# Patient Record
Sex: Female | Born: 1940 | Race: White | Hispanic: No | Marital: Married | Smoking: Former smoker
Health system: Southern US, Community
[De-identification: ages and names within clinical notes are randomized; demographics above are authoritative.]

## PROBLEM LIST (undated history)

## (undated) DIAGNOSIS — E669 Obesity, unspecified: Secondary | ICD-10-CM

## (undated) DIAGNOSIS — K644 Residual hemorrhoidal skin tags: Secondary | ICD-10-CM

## (undated) DIAGNOSIS — C801 Malignant (primary) neoplasm, unspecified: Secondary | ICD-10-CM

## (undated) DIAGNOSIS — K5909 Other constipation: Secondary | ICD-10-CM

## (undated) DIAGNOSIS — I509 Heart failure, unspecified: Secondary | ICD-10-CM

## (undated) DIAGNOSIS — IMO0002 Reserved for concepts with insufficient information to code with codable children: Secondary | ICD-10-CM

## (undated) DIAGNOSIS — M545 Low back pain, unspecified: Secondary | ICD-10-CM

## (undated) DIAGNOSIS — D369 Benign neoplasm, unspecified site: Secondary | ICD-10-CM

## (undated) DIAGNOSIS — R0989 Other specified symptoms and signs involving the circulatory and respiratory systems: Secondary | ICD-10-CM

## (undated) DIAGNOSIS — E8881 Metabolic syndrome: Secondary | ICD-10-CM

## (undated) DIAGNOSIS — M1990 Unspecified osteoarthritis, unspecified site: Secondary | ICD-10-CM

## (undated) DIAGNOSIS — R609 Edema, unspecified: Secondary | ICD-10-CM

## (undated) DIAGNOSIS — E785 Hyperlipidemia, unspecified: Secondary | ICD-10-CM

## (undated) DIAGNOSIS — D649 Anemia, unspecified: Secondary | ICD-10-CM

## (undated) DIAGNOSIS — I350 Nonrheumatic aortic (valve) stenosis: Secondary | ICD-10-CM

## (undated) DIAGNOSIS — I10 Essential (primary) hypertension: Secondary | ICD-10-CM

## (undated) DIAGNOSIS — I499 Cardiac arrhythmia, unspecified: Secondary | ICD-10-CM

## (undated) DIAGNOSIS — IMO0001 Reserved for inherently not codable concepts without codable children: Secondary | ICD-10-CM

## (undated) DIAGNOSIS — K219 Gastro-esophageal reflux disease without esophagitis: Secondary | ICD-10-CM

## (undated) HISTORY — DX: Benign neoplasm, unspecified site: D36.9

## (undated) HISTORY — DX: Other specified symptoms and signs involving the circulatory and respiratory systems: R09.89

## (undated) HISTORY — DX: Obesity, unspecified: E66.9

## (undated) HISTORY — DX: Malignant (primary) neoplasm, unspecified: C80.1

## (undated) HISTORY — DX: Metabolic syndrome: E88.810

## (undated) HISTORY — DX: Other constipation: K59.09

## (undated) HISTORY — DX: Metabolic syndrome: E88.81

## (undated) HISTORY — DX: Reserved for concepts with insufficient information to code with codable children: IMO0002

## (undated) HISTORY — PX: AORTIC VALVE REPLACEMENT: SHX41

## (undated) HISTORY — DX: Nonrheumatic aortic (valve) stenosis: I35.0

## (undated) HISTORY — PX: POLYPECTOMY: SHX149

## (undated) HISTORY — PX: BREAST LUMPECTOMY: SHX2

## (undated) HISTORY — PX: COLONOSCOPY: SHX174

## (undated) HISTORY — DX: Hyperlipidemia, unspecified: E78.5

## (undated) HISTORY — PX: REPLACEMENT TOTAL KNEE: SUR1224

## (undated) HISTORY — DX: Edema, unspecified: R60.9

## (undated) HISTORY — DX: Reserved for inherently not codable concepts without codable children: IMO0001

## (undated) HISTORY — DX: Low back pain, unspecified: M54.50

## (undated) HISTORY — DX: Low back pain: M54.5

## (undated) HISTORY — DX: Essential (primary) hypertension: I10

## (undated) HISTORY — PX: OTHER SURGICAL HISTORY: SHX169

## (undated) HISTORY — PX: TUBAL LIGATION: SHX77

## (undated) HISTORY — DX: Anemia, unspecified: D64.9

## (undated) HISTORY — DX: Unspecified osteoarthritis, unspecified site: M19.90

## (undated) HISTORY — DX: Residual hemorrhoidal skin tags: K64.4

## (undated) HISTORY — DX: Heart failure, unspecified: I50.9

## (undated) HISTORY — PX: ABDOMINAL HYSTERECTOMY: SHX81

## (undated) HISTORY — DX: Gastro-esophageal reflux disease without esophagitis: K21.9

---

## 1997-07-23 ENCOUNTER — Encounter: Payer: Self-pay | Admitting: Gastroenterology

## 1998-04-14 ENCOUNTER — Ambulatory Visit (HOSPITAL_COMMUNITY): Admission: RE | Admit: 1998-04-14 | Discharge: 1998-04-14 | Payer: Self-pay | Admitting: Obstetrics and Gynecology

## 1999-09-04 ENCOUNTER — Encounter (INDEPENDENT_AMBULATORY_CARE_PROVIDER_SITE_OTHER): Payer: Self-pay | Admitting: Specialist

## 1999-09-04 ENCOUNTER — Encounter: Payer: Self-pay | Admitting: Gastroenterology

## 1999-09-04 ENCOUNTER — Other Ambulatory Visit: Admission: RE | Admit: 1999-09-04 | Discharge: 1999-09-04 | Payer: Self-pay | Admitting: Gastroenterology

## 2000-07-25 ENCOUNTER — Other Ambulatory Visit: Admission: RE | Admit: 2000-07-25 | Discharge: 2000-07-25 | Payer: Self-pay | Admitting: Obstetrics and Gynecology

## 2000-08-05 ENCOUNTER — Encounter: Admission: RE | Admit: 2000-08-05 | Discharge: 2000-08-05 | Payer: Self-pay | Admitting: Obstetrics and Gynecology

## 2000-08-05 ENCOUNTER — Encounter: Payer: Self-pay | Admitting: Obstetrics and Gynecology

## 2000-09-18 ENCOUNTER — Encounter: Admission: RE | Admit: 2000-09-18 | Discharge: 2000-12-17 | Payer: Self-pay | Admitting: Internal Medicine

## 2001-09-16 ENCOUNTER — Ambulatory Visit (HOSPITAL_COMMUNITY): Admission: RE | Admit: 2001-09-16 | Discharge: 2001-09-16 | Payer: Self-pay | Admitting: Cardiology

## 2001-10-01 ENCOUNTER — Other Ambulatory Visit: Admission: RE | Admit: 2001-10-01 | Discharge: 2001-10-01 | Payer: Self-pay | Admitting: Obstetrics and Gynecology

## 2001-10-16 ENCOUNTER — Encounter: Admission: RE | Admit: 2001-10-16 | Discharge: 2001-10-16 | Payer: Self-pay | Admitting: Obstetrics and Gynecology

## 2001-10-16 ENCOUNTER — Encounter: Payer: Self-pay | Admitting: Obstetrics and Gynecology

## 2002-03-25 ENCOUNTER — Encounter: Payer: Self-pay | Admitting: Obstetrics and Gynecology

## 2002-03-27 ENCOUNTER — Encounter (INDEPENDENT_AMBULATORY_CARE_PROVIDER_SITE_OTHER): Payer: Self-pay

## 2002-03-27 ENCOUNTER — Ambulatory Visit (HOSPITAL_COMMUNITY): Admission: RE | Admit: 2002-03-27 | Discharge: 2002-03-27 | Payer: Self-pay | Admitting: Obstetrics and Gynecology

## 2002-05-13 ENCOUNTER — Encounter (INDEPENDENT_AMBULATORY_CARE_PROVIDER_SITE_OTHER): Payer: Self-pay | Admitting: Specialist

## 2002-05-13 ENCOUNTER — Inpatient Hospital Stay (HOSPITAL_COMMUNITY): Admission: RE | Admit: 2002-05-13 | Discharge: 2002-05-15 | Payer: Self-pay | Admitting: Obstetrics and Gynecology

## 2002-12-09 ENCOUNTER — Encounter: Admission: RE | Admit: 2002-12-09 | Discharge: 2002-12-09 | Payer: Self-pay | Admitting: Internal Medicine

## 2002-12-09 ENCOUNTER — Encounter: Payer: Self-pay | Admitting: Internal Medicine

## 2003-05-19 ENCOUNTER — Encounter: Payer: Self-pay | Admitting: Gastroenterology

## 2003-06-30 ENCOUNTER — Encounter: Payer: Self-pay | Admitting: Gastroenterology

## 2004-12-13 ENCOUNTER — Ambulatory Visit: Payer: Self-pay | Admitting: Internal Medicine

## 2005-01-17 ENCOUNTER — Ambulatory Visit: Payer: Self-pay | Admitting: Internal Medicine

## 2005-01-18 ENCOUNTER — Ambulatory Visit: Payer: Self-pay | Admitting: Internal Medicine

## 2005-02-15 ENCOUNTER — Encounter: Admission: RE | Admit: 2005-02-15 | Discharge: 2005-02-15 | Payer: Self-pay | Admitting: Internal Medicine

## 2005-03-06 ENCOUNTER — Encounter: Admission: RE | Admit: 2005-03-06 | Discharge: 2005-03-06 | Payer: Self-pay | Admitting: Internal Medicine

## 2005-05-23 ENCOUNTER — Ambulatory Visit: Payer: Self-pay | Admitting: Internal Medicine

## 2005-06-14 ENCOUNTER — Encounter: Admission: RE | Admit: 2005-06-14 | Discharge: 2005-09-12 | Payer: Self-pay | Admitting: Internal Medicine

## 2005-08-29 ENCOUNTER — Ambulatory Visit: Payer: Self-pay | Admitting: Internal Medicine

## 2005-10-23 ENCOUNTER — Encounter: Admission: RE | Admit: 2005-10-23 | Discharge: 2005-10-23 | Payer: Self-pay | Admitting: Obstetrics and Gynecology

## 2005-12-05 ENCOUNTER — Ambulatory Visit: Payer: Self-pay | Admitting: Internal Medicine

## 2005-12-14 ENCOUNTER — Ambulatory Visit: Payer: Self-pay | Admitting: Internal Medicine

## 2005-12-24 ENCOUNTER — Encounter: Admission: RE | Admit: 2005-12-24 | Discharge: 2005-12-24 | Payer: Self-pay | Admitting: Internal Medicine

## 2006-05-07 ENCOUNTER — Encounter: Admission: RE | Admit: 2006-05-07 | Discharge: 2006-05-07 | Payer: Self-pay | Admitting: Internal Medicine

## 2006-08-16 ENCOUNTER — Ambulatory Visit: Payer: Self-pay | Admitting: Internal Medicine

## 2006-08-21 ENCOUNTER — Ambulatory Visit: Payer: Self-pay | Admitting: Internal Medicine

## 2006-08-21 LAB — CONVERTED CEMR LAB
ALT: 18 units/L (ref 0–40)
Chol/HDL Ratio, serum: 4.2
Cholesterol: 206 mg/dL (ref 0–200)
Creatinine, Ser: 0.6 mg/dL (ref 0.4–1.2)
HCT: 35.4 % — ABNORMAL LOW (ref 36.0–46.0)
Hemoglobin: 11.7 g/dL — ABNORMAL LOW (ref 12.0–15.0)
Hgb A1c MFr Bld: 5.9 % (ref 4.6–6.0)
Lymphocytes Relative: 35.6 % (ref 12.0–46.0)
MCV: 90.9 fL (ref 78.0–100.0)
Microalb, Ur: 1.9 mg/dL — ABNORMAL HIGH (ref 0.0–1.9)
Monocytes Absolute: 0.6 10*3/uL (ref 0.2–0.7)
Neutro Abs: 4.4 10*3/uL (ref 1.4–7.7)
Neutrophils Relative %: 56 % (ref 43.0–77.0)
Saturation Ratios: 14.3 % — ABNORMAL LOW (ref 20.0–50.0)
Triglyceride fasting, serum: 73 mg/dL (ref 0–149)
Vitamin B-12: 243 pg/mL (ref 211–911)
WBC: 7.9 10*3/uL (ref 4.5–10.5)

## 2006-08-27 ENCOUNTER — Inpatient Hospital Stay (HOSPITAL_COMMUNITY): Admission: RE | Admit: 2006-08-27 | Discharge: 2006-08-31 | Payer: Self-pay | Admitting: Orthopedic Surgery

## 2006-08-30 ENCOUNTER — Ambulatory Visit: Payer: Self-pay | Admitting: Internal Medicine

## 2007-01-02 ENCOUNTER — Ambulatory Visit: Payer: Self-pay | Admitting: Internal Medicine

## 2007-01-02 LAB — CONVERTED CEMR LAB
ALT: 18 units/L (ref 0–40)
BUN: 27 mg/dL — ABNORMAL HIGH (ref 6–23)
Basophils Absolute: 0.1 10*3/uL (ref 0.0–0.1)
Basophils Relative: 1.3 % — ABNORMAL HIGH (ref 0.0–1.0)
Creatinine, Ser: 0.7 mg/dL (ref 0.4–1.2)
Eosinophils Relative: 0.1 % (ref 0.0–5.0)
HCT: 36 % (ref 36.0–46.0)
HDL: 56.7 mg/dL (ref >39.0)
Hgb A1c MFr Bld: 6.4 % — ABNORMAL HIGH (ref 4.6–6.0)
Lymphocytes Relative: 19.2 % (ref 12.0–46.0)
Monocytes Relative: 1.8 % — ABNORMAL LOW (ref 3.0–11.0)
Neutro Abs: 8.2 10*3/uL — ABNORMAL HIGH (ref 1.4–7.7)
Neutrophils Relative %: 77.6 % — ABNORMAL HIGH (ref 43.0–77.0)
Potassium: 5.1 meq/L (ref 3.5–5.1)
Total CHOL/HDL Ratio: 3.4
VLDL: 21 mg/dL (ref 0–40)

## 2007-04-07 DIAGNOSIS — D649 Anemia, unspecified: Secondary | ICD-10-CM

## 2007-04-07 DIAGNOSIS — R0989 Other specified symptoms and signs involving the circulatory and respiratory systems: Secondary | ICD-10-CM

## 2007-04-10 ENCOUNTER — Ambulatory Visit: Payer: Self-pay | Admitting: Internal Medicine

## 2007-04-28 ENCOUNTER — Ambulatory Visit: Payer: Self-pay | Admitting: Family Medicine

## 2007-04-28 DIAGNOSIS — E785 Hyperlipidemia, unspecified: Secondary | ICD-10-CM | POA: Insufficient documentation

## 2007-04-28 DIAGNOSIS — E1129 Type 2 diabetes mellitus with other diabetic kidney complication: Secondary | ICD-10-CM

## 2007-05-01 ENCOUNTER — Encounter: Payer: Self-pay | Admitting: Internal Medicine

## 2007-05-01 ENCOUNTER — Inpatient Hospital Stay (HOSPITAL_COMMUNITY): Admission: RE | Admit: 2007-05-01 | Discharge: 2007-05-04 | Payer: Self-pay | Admitting: Orthopedic Surgery

## 2007-05-12 ENCOUNTER — Telehealth (INDEPENDENT_AMBULATORY_CARE_PROVIDER_SITE_OTHER): Payer: Self-pay | Admitting: *Deleted

## 2007-05-16 ENCOUNTER — Telehealth (INDEPENDENT_AMBULATORY_CARE_PROVIDER_SITE_OTHER): Payer: Self-pay | Admitting: *Deleted

## 2007-05-16 ENCOUNTER — Ambulatory Visit: Payer: Self-pay | Admitting: Internal Medicine

## 2007-05-16 LAB — CONVERTED CEMR LAB
Glucose, Urine, Semiquant: NEGATIVE
Nitrite: NEGATIVE
Protein, U semiquant: 30
Specific Gravity, Urine: <1.005

## 2007-05-17 ENCOUNTER — Encounter: Payer: Self-pay | Admitting: Internal Medicine

## 2007-06-23 ENCOUNTER — Telehealth (INDEPENDENT_AMBULATORY_CARE_PROVIDER_SITE_OTHER): Payer: Self-pay | Admitting: *Deleted

## 2007-07-21 ENCOUNTER — Encounter: Payer: Self-pay | Admitting: Internal Medicine

## 2007-09-02 ENCOUNTER — Telehealth (INDEPENDENT_AMBULATORY_CARE_PROVIDER_SITE_OTHER): Payer: Self-pay | Admitting: *Deleted

## 2007-10-01 ENCOUNTER — Inpatient Hospital Stay (HOSPITAL_COMMUNITY): Admission: EM | Admit: 2007-10-01 | Discharge: 2007-10-05 | Payer: Self-pay | Admitting: Emergency Medicine

## 2007-10-01 ENCOUNTER — Encounter: Payer: Self-pay | Admitting: Cardiovascular Disease

## 2007-10-01 ENCOUNTER — Ambulatory Visit: Payer: Self-pay | Admitting: Cardiovascular Disease

## 2007-10-04 ENCOUNTER — Encounter: Payer: Self-pay | Admitting: Cardiovascular Disease

## 2007-10-06 ENCOUNTER — Ambulatory Visit: Payer: Self-pay | Admitting: Internal Medicine

## 2007-10-06 DIAGNOSIS — M545 Low back pain: Secondary | ICD-10-CM

## 2007-10-06 LAB — CONVERTED CEMR LAB
Glucose, Urine, Semiquant: NEGATIVE
Ketones, urine, test strip: NEGATIVE
Specific Gravity, Urine: 1.01
pH: 5

## 2007-10-08 ENCOUNTER — Encounter: Payer: Self-pay | Admitting: Internal Medicine

## 2007-10-13 ENCOUNTER — Ambulatory Visit: Payer: Self-pay | Admitting: Cardiovascular Disease

## 2007-12-15 ENCOUNTER — Telehealth (INDEPENDENT_AMBULATORY_CARE_PROVIDER_SITE_OTHER): Payer: Self-pay | Admitting: *Deleted

## 2007-12-16 ENCOUNTER — Telehealth (INDEPENDENT_AMBULATORY_CARE_PROVIDER_SITE_OTHER): Payer: Self-pay | Admitting: *Deleted

## 2007-12-18 ENCOUNTER — Ambulatory Visit: Payer: Self-pay | Admitting: Internal Medicine

## 2007-12-18 DIAGNOSIS — I10 Essential (primary) hypertension: Secondary | ICD-10-CM | POA: Insufficient documentation

## 2007-12-19 ENCOUNTER — Telehealth (INDEPENDENT_AMBULATORY_CARE_PROVIDER_SITE_OTHER): Payer: Self-pay | Admitting: *Deleted

## 2008-01-20 ENCOUNTER — Telehealth (INDEPENDENT_AMBULATORY_CARE_PROVIDER_SITE_OTHER): Payer: Self-pay | Admitting: *Deleted

## 2008-01-22 ENCOUNTER — Telehealth (INDEPENDENT_AMBULATORY_CARE_PROVIDER_SITE_OTHER): Payer: Self-pay | Admitting: *Deleted

## 2008-01-23 ENCOUNTER — Encounter: Payer: Self-pay | Admitting: Internal Medicine

## 2008-01-23 ENCOUNTER — Ambulatory Visit: Payer: Self-pay | Admitting: Family Medicine

## 2008-01-29 ENCOUNTER — Telehealth (INDEPENDENT_AMBULATORY_CARE_PROVIDER_SITE_OTHER): Payer: Self-pay | Admitting: *Deleted

## 2008-01-30 ENCOUNTER — Ambulatory Visit: Payer: Self-pay | Admitting: Internal Medicine

## 2008-02-16 ENCOUNTER — Ambulatory Visit: Payer: Self-pay | Admitting: Internal Medicine

## 2008-02-19 ENCOUNTER — Encounter (INDEPENDENT_AMBULATORY_CARE_PROVIDER_SITE_OTHER): Payer: Self-pay | Admitting: *Deleted

## 2008-02-19 LAB — CONVERTED CEMR LAB
ALT: 18 units/L (ref 0–35)
AST: 18 units/L (ref 0–37)
Albumin: 3.6 g/dL (ref 3.5–5.2)
Alkaline Phosphatase: 71 units/L (ref 39–117)
LDL Cholesterol: 123 mg/dL — ABNORMAL HIGH (ref 0–99)
Microalb Creat Ratio: 24.2 mg/g (ref 0.0–30.0)
Total Protein: 7.4 g/dL (ref 6.0–8.3)

## 2008-02-26 ENCOUNTER — Ambulatory Visit: Payer: Self-pay | Admitting: Cardiovascular Disease

## 2008-03-11 ENCOUNTER — Ambulatory Visit: Payer: Self-pay | Admitting: Internal Medicine

## 2008-05-10 ENCOUNTER — Ambulatory Visit: Payer: Self-pay | Admitting: Gastroenterology

## 2008-06-07 ENCOUNTER — Telehealth: Payer: Self-pay | Admitting: Gastroenterology

## 2008-06-08 ENCOUNTER — Ambulatory Visit: Payer: Self-pay | Admitting: Gastroenterology

## 2008-06-08 ENCOUNTER — Encounter: Payer: Self-pay | Admitting: Gastroenterology

## 2008-06-10 ENCOUNTER — Encounter: Payer: Self-pay | Admitting: Gastroenterology

## 2008-06-21 ENCOUNTER — Telehealth (INDEPENDENT_AMBULATORY_CARE_PROVIDER_SITE_OTHER): Payer: Self-pay | Admitting: *Deleted

## 2008-08-16 ENCOUNTER — Encounter: Admission: RE | Admit: 2008-08-16 | Discharge: 2008-08-16 | Payer: Self-pay | Admitting: Obstetrics and Gynecology

## 2008-08-16 ENCOUNTER — Encounter (INDEPENDENT_AMBULATORY_CARE_PROVIDER_SITE_OTHER): Payer: Self-pay | Admitting: Diagnostic Radiology

## 2008-08-24 ENCOUNTER — Encounter: Admission: RE | Admit: 2008-08-24 | Discharge: 2008-08-24 | Payer: Self-pay | Admitting: Obstetrics and Gynecology

## 2008-08-24 ENCOUNTER — Ambulatory Visit: Admission: RE | Admit: 2008-08-24 | Discharge: 2008-11-14 | Payer: Self-pay | Admitting: Radiation Oncology

## 2008-08-25 ENCOUNTER — Encounter: Payer: Self-pay | Admitting: Internal Medicine

## 2008-08-30 ENCOUNTER — Encounter: Admission: RE | Admit: 2008-08-30 | Discharge: 2008-08-30 | Payer: Self-pay | Admitting: General Surgery

## 2008-08-31 ENCOUNTER — Ambulatory Visit (HOSPITAL_BASED_OUTPATIENT_CLINIC_OR_DEPARTMENT_OTHER): Admission: RE | Admit: 2008-08-31 | Discharge: 2008-08-31 | Payer: Self-pay | Admitting: General Surgery

## 2008-08-31 ENCOUNTER — Encounter (INDEPENDENT_AMBULATORY_CARE_PROVIDER_SITE_OTHER): Payer: Self-pay | Admitting: General Surgery

## 2008-08-31 ENCOUNTER — Encounter: Admission: RE | Admit: 2008-08-31 | Discharge: 2008-08-31 | Payer: Self-pay | Admitting: General Surgery

## 2008-09-02 ENCOUNTER — Encounter (INDEPENDENT_AMBULATORY_CARE_PROVIDER_SITE_OTHER): Payer: Self-pay | Admitting: General Surgery

## 2008-09-02 ENCOUNTER — Ambulatory Visit (HOSPITAL_COMMUNITY): Admission: RE | Admit: 2008-09-02 | Discharge: 2008-09-02 | Payer: Self-pay | Admitting: General Surgery

## 2008-09-08 ENCOUNTER — Encounter: Payer: Self-pay | Admitting: Cardiovascular Disease

## 2008-09-08 ENCOUNTER — Ambulatory Visit: Payer: Self-pay

## 2008-09-08 ENCOUNTER — Ambulatory Visit: Payer: Self-pay | Admitting: Cardiovascular Disease

## 2008-09-09 ENCOUNTER — Encounter: Payer: Self-pay | Admitting: Internal Medicine

## 2008-11-09 ENCOUNTER — Encounter: Payer: Self-pay | Admitting: Internal Medicine

## 2008-11-12 ENCOUNTER — Ambulatory Visit: Payer: Self-pay | Admitting: Internal Medicine

## 2008-11-12 ENCOUNTER — Encounter (INDEPENDENT_AMBULATORY_CARE_PROVIDER_SITE_OTHER): Payer: Self-pay | Admitting: *Deleted

## 2008-11-12 DIAGNOSIS — Z853 Personal history of malignant neoplasm of breast: Secondary | ICD-10-CM

## 2008-11-12 LAB — CONVERTED CEMR LAB
Cholesterol, target level: 200 mg/dL
HDL goal, serum: 40 mg/dL
LDL Goal: 100 mg/dL

## 2008-11-24 ENCOUNTER — Ambulatory Visit: Payer: Self-pay | Admitting: Oncology

## 2008-12-02 ENCOUNTER — Encounter: Payer: Self-pay | Admitting: Internal Medicine

## 2009-01-04 ENCOUNTER — Ambulatory Visit: Payer: Self-pay | Admitting: Cardiovascular Disease

## 2009-02-16 ENCOUNTER — Ambulatory Visit: Payer: Self-pay | Admitting: Internal Medicine

## 2009-02-16 DIAGNOSIS — E8881 Metabolic syndrome: Secondary | ICD-10-CM

## 2009-02-21 ENCOUNTER — Ambulatory Visit: Payer: Self-pay | Admitting: Oncology

## 2009-02-22 ENCOUNTER — Encounter: Admission: RE | Admit: 2009-02-22 | Discharge: 2009-02-22 | Payer: Self-pay | Admitting: General Surgery

## 2009-02-26 LAB — CONVERTED CEMR LAB
ALT: 16 units/L (ref 0–35)
AST: 17 units/L (ref 0–37)
Alkaline Phosphatase: 67 units/L (ref 39–117)
BUN: 22 mg/dL (ref 6–23)
Basophils Relative: 0.5 % (ref 0.0–3.0)
Bilirubin, Direct: 0 mg/dL (ref 0.0–0.3)
Cholesterol: 188 mg/dL (ref 0–200)
Creatinine, Ser: 0.7 mg/dL (ref 0.4–1.2)
HCT: 34.7 % — ABNORMAL LOW (ref 36.0–46.0)
Hemoglobin: 11.7 g/dL — ABNORMAL LOW (ref 12.0–15.0)
MCHC: 33.8 g/dL (ref 30.0–36.0)
Microalb Creat Ratio: 2.4 mg/g (ref 0.0–30.0)
Microalb, Ur: 0.3 mg/dL (ref 0.0–1.9)
Monocytes Absolute: 0.4 10*3/uL (ref 0.1–1.0)
Monocytes Relative: 7.2 % (ref 3.0–12.0)
Platelets: 206 10*3/uL (ref 150.0–400.0)
Potassium: 4.8 meq/L (ref 3.5–5.1)
RBC: 3.96 M/uL (ref 3.87–5.11)
TSH: 2.32 microintl units/mL (ref 0.35–5.50)
Total CHOL/HDL Ratio: 4
VLDL: 19.2 mg/dL (ref 0.0–40.0)
WBC: 5.7 10*3/uL (ref 4.5–10.5)

## 2009-02-28 ENCOUNTER — Encounter (INDEPENDENT_AMBULATORY_CARE_PROVIDER_SITE_OTHER): Payer: Self-pay | Admitting: *Deleted

## 2009-03-28 ENCOUNTER — Ambulatory Visit: Payer: Self-pay | Admitting: Internal Medicine

## 2009-03-29 ENCOUNTER — Telehealth (INDEPENDENT_AMBULATORY_CARE_PROVIDER_SITE_OTHER): Payer: Self-pay | Admitting: *Deleted

## 2009-04-15 ENCOUNTER — Telehealth (INDEPENDENT_AMBULATORY_CARE_PROVIDER_SITE_OTHER): Payer: Self-pay | Admitting: *Deleted

## 2009-07-22 ENCOUNTER — Ambulatory Visit: Payer: Self-pay | Admitting: Internal Medicine

## 2009-07-22 DIAGNOSIS — R042 Hemoptysis: Secondary | ICD-10-CM | POA: Insufficient documentation

## 2009-07-25 ENCOUNTER — Ambulatory Visit: Payer: Self-pay | Admitting: Internal Medicine

## 2009-07-26 ENCOUNTER — Encounter (INDEPENDENT_AMBULATORY_CARE_PROVIDER_SITE_OTHER): Payer: Self-pay | Admitting: *Deleted

## 2009-08-22 ENCOUNTER — Encounter: Payer: Self-pay | Admitting: Cardiovascular Disease

## 2009-08-22 ENCOUNTER — Encounter: Admission: RE | Admit: 2009-08-22 | Discharge: 2009-08-22 | Payer: Self-pay | Admitting: General Surgery

## 2009-09-19 DIAGNOSIS — M1990 Unspecified osteoarthritis, unspecified site: Secondary | ICD-10-CM | POA: Insufficient documentation

## 2009-09-19 DIAGNOSIS — I359 Nonrheumatic aortic valve disorder, unspecified: Secondary | ICD-10-CM | POA: Insufficient documentation

## 2009-09-19 DIAGNOSIS — R609 Edema, unspecified: Secondary | ICD-10-CM | POA: Insufficient documentation

## 2009-09-19 DIAGNOSIS — I509 Heart failure, unspecified: Secondary | ICD-10-CM | POA: Insufficient documentation

## 2009-09-19 DIAGNOSIS — K219 Gastro-esophageal reflux disease without esophagitis: Secondary | ICD-10-CM

## 2009-09-20 ENCOUNTER — Ambulatory Visit: Payer: Self-pay | Admitting: Cardiovascular Disease

## 2009-09-20 ENCOUNTER — Ambulatory Visit (HOSPITAL_COMMUNITY): Admission: RE | Admit: 2009-09-20 | Discharge: 2009-09-20 | Payer: Self-pay | Admitting: Cardiovascular Disease

## 2009-09-20 ENCOUNTER — Encounter: Payer: Self-pay | Admitting: Cardiovascular Disease

## 2009-09-20 ENCOUNTER — Ambulatory Visit: Payer: Self-pay

## 2009-09-20 ENCOUNTER — Ambulatory Visit: Payer: Self-pay | Admitting: Cardiology

## 2009-10-31 ENCOUNTER — Inpatient Hospital Stay (HOSPITAL_COMMUNITY): Admission: EM | Admit: 2009-10-31 | Discharge: 2009-11-03 | Payer: Self-pay | Admitting: Emergency Medicine

## 2009-10-31 ENCOUNTER — Telehealth (INDEPENDENT_AMBULATORY_CARE_PROVIDER_SITE_OTHER): Payer: Self-pay | Admitting: Physician Assistant

## 2009-10-31 ENCOUNTER — Ambulatory Visit: Payer: Self-pay | Admitting: Internal Medicine

## 2009-11-02 ENCOUNTER — Ambulatory Visit: Payer: Self-pay | Admitting: Cardiothoracic Surgery

## 2009-11-02 ENCOUNTER — Encounter: Payer: Self-pay | Admitting: Cardiovascular Disease

## 2009-11-02 ENCOUNTER — Encounter: Payer: Self-pay | Admitting: Cardiothoracic Surgery

## 2009-11-08 ENCOUNTER — Ambulatory Visit: Payer: Self-pay | Admitting: Cardiothoracic Surgery

## 2009-11-08 ENCOUNTER — Inpatient Hospital Stay (HOSPITAL_COMMUNITY): Admission: RE | Admit: 2009-11-08 | Discharge: 2009-11-16 | Payer: Self-pay | Admitting: Cardiothoracic Surgery

## 2009-11-08 ENCOUNTER — Ambulatory Visit: Payer: Self-pay | Admitting: Cardiology

## 2009-11-08 ENCOUNTER — Encounter (INDEPENDENT_AMBULATORY_CARE_PROVIDER_SITE_OTHER): Payer: Self-pay | Admitting: Anesthesiology

## 2009-11-08 ENCOUNTER — Encounter: Payer: Self-pay | Admitting: Cardiothoracic Surgery

## 2009-11-10 ENCOUNTER — Encounter: Payer: Self-pay | Admitting: Cardiovascular Disease

## 2009-11-21 ENCOUNTER — Encounter: Payer: Self-pay | Admitting: Cardiovascular Disease

## 2009-11-24 ENCOUNTER — Telehealth (INDEPENDENT_AMBULATORY_CARE_PROVIDER_SITE_OTHER): Payer: Self-pay | Admitting: *Deleted

## 2009-11-30 ENCOUNTER — Telehealth: Payer: Self-pay | Admitting: Cardiovascular Disease

## 2009-12-01 ENCOUNTER — Ambulatory Visit: Payer: Self-pay | Admitting: Cardiovascular Disease

## 2009-12-01 DIAGNOSIS — R0989 Other specified symptoms and signs involving the circulatory and respiratory systems: Secondary | ICD-10-CM | POA: Insufficient documentation

## 2009-12-01 DIAGNOSIS — R1013 Epigastric pain: Secondary | ICD-10-CM

## 2009-12-01 DIAGNOSIS — K3189 Other diseases of stomach and duodenum: Secondary | ICD-10-CM

## 2009-12-01 DIAGNOSIS — R0609 Other forms of dyspnea: Secondary | ICD-10-CM

## 2009-12-02 ENCOUNTER — Telehealth (INDEPENDENT_AMBULATORY_CARE_PROVIDER_SITE_OTHER): Payer: Self-pay | Admitting: *Deleted

## 2009-12-03 ENCOUNTER — Telehealth: Payer: Self-pay | Admitting: Nurse Practitioner

## 2009-12-07 ENCOUNTER — Encounter: Payer: Self-pay | Admitting: Cardiovascular Disease

## 2009-12-08 ENCOUNTER — Encounter (INDEPENDENT_AMBULATORY_CARE_PROVIDER_SITE_OTHER): Payer: Self-pay | Admitting: *Deleted

## 2009-12-08 ENCOUNTER — Encounter: Admission: RE | Admit: 2009-12-08 | Discharge: 2009-12-08 | Payer: Self-pay | Admitting: Cardiothoracic Surgery

## 2009-12-09 ENCOUNTER — Ambulatory Visit: Payer: Self-pay | Admitting: Cardiothoracic Surgery

## 2009-12-09 ENCOUNTER — Ambulatory Visit: Payer: Self-pay | Admitting: Cardiovascular Disease

## 2009-12-09 ENCOUNTER — Ambulatory Visit: Payer: Self-pay

## 2009-12-09 ENCOUNTER — Telehealth: Payer: Self-pay | Admitting: Cardiovascular Disease

## 2009-12-09 ENCOUNTER — Encounter: Payer: Self-pay | Admitting: Cardiovascular Disease

## 2009-12-09 ENCOUNTER — Ambulatory Visit (HOSPITAL_COMMUNITY): Admission: RE | Admit: 2009-12-09 | Discharge: 2009-12-09 | Payer: Self-pay | Admitting: Cardiovascular Disease

## 2009-12-13 ENCOUNTER — Ambulatory Visit: Payer: Self-pay | Admitting: Internal Medicine

## 2009-12-13 DIAGNOSIS — R091 Pleurisy: Secondary | ICD-10-CM | POA: Insufficient documentation

## 2009-12-21 ENCOUNTER — Ambulatory Visit: Payer: Self-pay | Admitting: Internal Medicine

## 2009-12-23 ENCOUNTER — Encounter (HOSPITAL_COMMUNITY): Admission: RE | Admit: 2009-12-23 | Discharge: 2010-03-23 | Payer: Self-pay | Admitting: Cardiovascular Disease

## 2009-12-26 LAB — CONVERTED CEMR LAB
ALT: 13 units/L (ref 0–35)
Albumin: 3.7 g/dL (ref 3.5–5.2)
Bilirubin, Direct: 0.1 mg/dL (ref 0.0–0.3)
Iron: 65 ug/dL (ref 42–145)
LDL Cholesterol: 103 mg/dL — ABNORMAL HIGH (ref 0–99)
Lymphocytes Relative: 21.4 % (ref 12.0–46.0)
Lymphs Abs: 1.4 10*3/uL (ref 0.7–4.0)
MCHC: 33.4 g/dL (ref 30.0–36.0)
Monocytes Absolute: 0.5 10*3/uL (ref 0.1–1.0)
Neutro Abs: 4.5 10*3/uL (ref 1.4–7.7)
Potassium: 4.4 meq/L (ref 3.5–5.1)
RBC: 3.71 M/uL — ABNORMAL LOW (ref 3.87–5.11)
RDW: 14.3 % (ref 11.5–14.6)
Total CHOL/HDL Ratio: 4
Total Protein: 7.1 g/dL (ref 6.0–8.3)
VLDL: 23.6 mg/dL (ref 0.0–40.0)
Vitamin B-12: 267 pg/mL (ref 211–911)

## 2010-01-05 ENCOUNTER — Ambulatory Visit: Payer: Self-pay | Admitting: Cardiothoracic Surgery

## 2010-01-05 ENCOUNTER — Encounter: Admission: RE | Admit: 2010-01-05 | Discharge: 2010-01-05 | Payer: Self-pay | Admitting: Cardiothoracic Surgery

## 2010-01-19 ENCOUNTER — Encounter: Payer: Self-pay | Admitting: Cardiovascular Disease

## 2010-02-09 ENCOUNTER — Ambulatory Visit: Payer: Self-pay | Admitting: Cardiovascular Disease

## 2010-03-03 ENCOUNTER — Ambulatory Visit: Payer: Self-pay | Admitting: Cardiovascular Disease

## 2010-03-03 ENCOUNTER — Ambulatory Visit: Payer: Self-pay

## 2010-03-03 ENCOUNTER — Encounter: Payer: Self-pay | Admitting: Cardiovascular Disease

## 2010-03-03 ENCOUNTER — Ambulatory Visit (HOSPITAL_COMMUNITY): Admission: RE | Admit: 2010-03-03 | Discharge: 2010-03-03 | Payer: Self-pay | Admitting: Cardiovascular Disease

## 2010-03-30 ENCOUNTER — Ambulatory Visit: Payer: Self-pay | Admitting: Internal Medicine

## 2010-03-30 DIAGNOSIS — Z954 Presence of other heart-valve replacement: Secondary | ICD-10-CM

## 2010-03-31 ENCOUNTER — Ambulatory Visit: Payer: Self-pay | Admitting: Internal Medicine

## 2010-04-04 ENCOUNTER — Telehealth (INDEPENDENT_AMBULATORY_CARE_PROVIDER_SITE_OTHER): Payer: Self-pay | Admitting: *Deleted

## 2010-04-06 ENCOUNTER — Ambulatory Visit: Payer: Self-pay | Admitting: Cardiothoracic Surgery

## 2010-04-06 ENCOUNTER — Encounter: Payer: Self-pay | Admitting: Internal Medicine

## 2010-04-17 ENCOUNTER — Encounter (INDEPENDENT_AMBULATORY_CARE_PROVIDER_SITE_OTHER): Payer: Self-pay | Admitting: *Deleted

## 2010-04-26 ENCOUNTER — Ambulatory Visit: Payer: Self-pay | Admitting: Internal Medicine

## 2010-05-01 LAB — CONVERTED CEMR LAB
Basophils Absolute: 0 10*3/uL (ref 0.0–0.1)
Eosinophils Absolute: 0.2 10*3/uL (ref 0.0–0.7)
Hemoglobin: 11.1 g/dL — ABNORMAL LOW (ref 12.0–15.0)
Hgb A1c MFr Bld: 6.2 % (ref 4.6–6.5)
Lymphocytes Relative: 22 % (ref 12.0–46.0)
Lymphs Abs: 1.5 10*3/uL (ref 0.7–4.0)
MCHC: 33.8 g/dL (ref 30.0–36.0)
MCV: 87.5 fL (ref 78.0–100.0)
Monocytes Relative: 6.5 % (ref 3.0–12.0)
Neutro Abs: 4.6 10*3/uL (ref 1.4–7.7)
RBC: 3.77 M/uL — ABNORMAL LOW (ref 3.87–5.11)

## 2010-05-05 ENCOUNTER — Telehealth: Payer: Self-pay | Admitting: Cardiovascular Disease

## 2010-05-09 ENCOUNTER — Telehealth: Payer: Self-pay | Admitting: Cardiovascular Disease

## 2010-06-12 ENCOUNTER — Ambulatory Visit: Payer: Self-pay | Admitting: Internal Medicine

## 2010-06-12 DIAGNOSIS — R21 Rash and other nonspecific skin eruption: Secondary | ICD-10-CM | POA: Insufficient documentation

## 2010-06-19 ENCOUNTER — Encounter: Payer: Self-pay | Admitting: Internal Medicine

## 2010-06-19 ENCOUNTER — Encounter: Payer: Self-pay | Admitting: Cardiovascular Disease

## 2010-08-02 ENCOUNTER — Telehealth: Payer: Self-pay | Admitting: Cardiovascular Disease

## 2010-08-04 ENCOUNTER — Telehealth (INDEPENDENT_AMBULATORY_CARE_PROVIDER_SITE_OTHER): Payer: Self-pay | Admitting: *Deleted

## 2010-08-21 ENCOUNTER — Ambulatory Visit: Payer: Self-pay | Admitting: Internal Medicine

## 2010-08-23 ENCOUNTER — Encounter: Admission: RE | Admit: 2010-08-23 | Discharge: 2010-08-23 | Payer: Self-pay | Admitting: Obstetrics and Gynecology

## 2010-08-24 ENCOUNTER — Ambulatory Visit: Payer: Self-pay | Admitting: Cardiovascular Disease

## 2010-08-28 ENCOUNTER — Ambulatory Visit: Payer: Self-pay | Admitting: Internal Medicine

## 2010-08-28 ENCOUNTER — Telehealth (INDEPENDENT_AMBULATORY_CARE_PROVIDER_SITE_OTHER): Payer: Self-pay | Admitting: *Deleted

## 2010-08-29 LAB — CONVERTED CEMR LAB
Basophils Absolute: 0 10*3/uL (ref 0.0–0.1)
Calcium: 9 mg/dL (ref 8.4–10.5)
Chloride: 98 meq/L (ref 96–112)
Creatinine, Ser: 0.9 mg/dL (ref 0.4–1.2)
Eosinophils Relative: 1 % (ref 0.0–5.0)
Glucose, Bld: 139 mg/dL — ABNORMAL HIGH (ref 70–99)
HCT: 33.5 % — ABNORMAL LOW (ref 36.0–46.0)
Hemoglobin: 11.3 g/dL — ABNORMAL LOW (ref 12.0–15.0)
Lymphs Abs: 1.1 10*3/uL (ref 0.7–4.0)
Monocytes Relative: 13 % — ABNORMAL HIGH (ref 3.0–12.0)
Neutrophils Relative %: 67.1 % (ref 43.0–77.0)
Sodium: 136 meq/L (ref 135–145)

## 2010-09-06 ENCOUNTER — Ambulatory Visit: Payer: Self-pay | Admitting: Internal Medicine

## 2010-09-07 ENCOUNTER — Ambulatory Visit: Payer: Self-pay | Admitting: Internal Medicine

## 2010-09-08 LAB — CONVERTED CEMR LAB: Vitamin B-12: 500 pg/mL (ref 211–911)

## 2010-09-11 ENCOUNTER — Ambulatory Visit: Payer: Self-pay | Admitting: Internal Medicine

## 2010-09-11 DIAGNOSIS — E1165 Type 2 diabetes mellitus with hyperglycemia: Secondary | ICD-10-CM

## 2010-09-11 DIAGNOSIS — R918 Other nonspecific abnormal finding of lung field: Secondary | ICD-10-CM | POA: Insufficient documentation

## 2010-09-14 ENCOUNTER — Telehealth: Payer: Self-pay | Admitting: Internal Medicine

## 2010-09-14 DIAGNOSIS — L609 Nail disorder, unspecified: Secondary | ICD-10-CM | POA: Insufficient documentation

## 2010-10-15 ENCOUNTER — Encounter: Payer: Self-pay | Admitting: Internal Medicine

## 2010-11-08 ENCOUNTER — Telehealth: Payer: Self-pay | Admitting: Cardiovascular Disease

## 2010-11-20 ENCOUNTER — Ambulatory Visit
Admission: RE | Admit: 2010-11-20 | Discharge: 2010-11-20 | Payer: Self-pay | Source: Home / Self Care | Attending: Gastroenterology | Admitting: Gastroenterology

## 2010-11-20 DIAGNOSIS — K5900 Constipation, unspecified: Secondary | ICD-10-CM | POA: Insufficient documentation

## 2010-11-20 DIAGNOSIS — Z860100 Personal history of colon polyps, unspecified: Secondary | ICD-10-CM | POA: Insufficient documentation

## 2010-11-20 DIAGNOSIS — R195 Other fecal abnormalities: Secondary | ICD-10-CM | POA: Insufficient documentation

## 2010-11-22 ENCOUNTER — Telehealth: Payer: Self-pay | Admitting: Internal Medicine

## 2010-11-26 ENCOUNTER — Encounter: Payer: Self-pay | Admitting: Internal Medicine

## 2010-11-29 ENCOUNTER — Ambulatory Visit
Admission: RE | Admit: 2010-11-29 | Discharge: 2010-11-29 | Payer: Self-pay | Source: Home / Self Care | Attending: Cardiovascular Disease | Admitting: Cardiovascular Disease

## 2010-12-03 LAB — CONVERTED CEMR LAB
AST: 19 units/L (ref 0–37)
BUN: 27 mg/dL — ABNORMAL HIGH (ref 6–23)
Creatinine, Ser: 0.7 mg/dL (ref 0.4–1.2)
Microalb, Ur: 0.4 mg/dL (ref 0.0–1.9)
Potassium: 4.8 meq/L (ref 3.5–5.1)
TSH: 2.48 microintl units/mL (ref 0.35–5.50)

## 2010-12-06 ENCOUNTER — Other Ambulatory Visit: Payer: Self-pay | Admitting: Internal Medicine

## 2010-12-06 ENCOUNTER — Other Ambulatory Visit (INDEPENDENT_AMBULATORY_CARE_PROVIDER_SITE_OTHER): Payer: Medicare Other

## 2010-12-06 ENCOUNTER — Encounter (INDEPENDENT_AMBULATORY_CARE_PROVIDER_SITE_OTHER): Payer: Self-pay | Admitting: *Deleted

## 2010-12-06 ENCOUNTER — Ambulatory Visit: Admit: 2010-12-06 | Payer: Self-pay | Admitting: Internal Medicine

## 2010-12-06 DIAGNOSIS — E1165 Type 2 diabetes mellitus with hyperglycemia: Secondary | ICD-10-CM

## 2010-12-06 LAB — POTASSIUM: Potassium: 4.7 mEq/L (ref 3.5–5.1)

## 2010-12-06 LAB — BUN: BUN: 20 mg/dL (ref 6–23)

## 2010-12-06 LAB — MICROALBUMIN / CREATININE URINE RATIO
Creatinine,U: 43.6 mg/dL
Microalb Creat Ratio: 4.4 mg/g (ref 0.0–30.0)

## 2010-12-07 NOTE — Progress Notes (Signed)
Summary: has questions re medication   Phone Note Call from Patient   Caller: Patient [phone] Reason for Call: Talk to Nurse Summary of Call: pt wants to know if she needs to continue zaroxolyn 2.5mg  -if so she needs refill at [facility] [address]-also wants to know if she should double her lasix-pls call [phone] Initial call taken by: Glynda Jaeger,  May 09, 2010 8:37 AM  Follow-up for Phone Call        spoke with pt, she has gained about 5 lbs over the last several days. she does not have edema but feels she is having to breath harder when she lies flat. she has not had to get up to breath. she denies SOB with ADL's. pt will increase her lasix to 40mg  two times a day for the next two to three days to see if that helps improve her weight and breathing. she will also take an extra potassium when she takes the extra lasix. she is going out of the state next week and wants to make sure she is okay to travel. she will give me a call back the first of the week to report how she is doing. she has refills on her zaroxolyn and will call the pharm Deliah Goody, RN  May 09, 2010 12:51 PM

## 2010-12-07 NOTE — Progress Notes (Signed)
Summary: Chest Xray Results  Phone Note Outgoing Call Call back at Home Phone [phone]   Call placed by: Shonna Chock,  Apr 04, 2010 2:50 PM Call placed to: Patient Details for Reason: Chest Xray Summary of Call: Spoke with patient about info below: (copy of chest xray mailed)  The pleurisy type pain & small collection of fluid on the left suggest some inflammation of the pleura or lining of the lung. If symptoms are not better with the medication or if pain progresses , a CT scan would be indicated . You would have to stop Metformin & have kidney function (BUN,creat) checked prior to that. study. Hopp  Chrae Malloy  Apr 04, 2010 2:50 PM

## 2010-12-07 NOTE — Letter (Signed)
Summary: [facility]   [facility]   Imported By: Roderic Ovens 12/27/2009 12:54:07  _____________________________________________________________________  External Attachment:    Type:   Image     Comment:   External Document

## 2010-12-07 NOTE — Miscellaneous (Signed)
Summary: Orders Update  Clinical Lists Changes  Orders: Added new Referral order of Echocardiogram (Echo) - Signed 

## 2010-12-07 NOTE — Progress Notes (Signed)
Summary: req call back   Phone Note Call from Patient Call back at Home Phone [phone]   Caller: Patient Reason for Call: Talk to Nurse Summary of Call: req call back from [facility] Initial call taken by: Migdalia Dk,  December 09, 2009 11:37 AM  Follow-up for Phone Call        12/09/09--2:30 pm--pt calling wanting refill on hycodan syrup--1-2 tsp q 6 hours as needed cough--called in to [facility] [address] 5ml plus 1 refill--per [place] dr Toi Stelly--nt Follow-up by: Ledon Snare, RN,  December 09, 2009 2:32 PM

## 2010-12-07 NOTE — Letter (Signed)
Summary: Primary Care Appointment Letter  [facility]  [address] [place], [place] [address]   Phone: [phone]  Fax: [phone]    04/17/2010 MRN: [id number]  [facility] [address] [place], [place]  [address]  Dear [facility],   Your Primary Care Physician Marga Melnick MD has indicated that:    ____X___it is time to schedule an appointment: Copied/Pasted from labs done in 12/2009-Recheck CBC , B12 level & A1c in 4 months(JUNE-2011)  & see me 2-3 days later (250.00,285.9).    _______you missed your appointment on______ and need to call and          reschedule.    ____X___you need to have lab work done.    _______you need to schedule an appointment discuss lab or test results.    _______you need to call to reschedule your appointment that is                       scheduled on _________.     Please call our office as soon as possible. Our phone number is [phone]. Please press option 1. Our office is open 8a-5p, Monday through Friday.     Thank you,    [facility]

## 2010-12-07 NOTE — Consult Note (Signed)
Summary: [facility]   Imported By: Lanelle Bal 10/26/2010 09:19:41  _____________________________________________________________________  External Attachment:    Type:   Image     Comment:   External Document

## 2010-12-07 NOTE — Progress Notes (Signed)
Summary: Request for referral  Phone Note Call from Patient Call back at Home Phone [phone]   Caller: Patient Summary of Call: Message left on voicemail: Patient would like to consider foot doctor referral due to nail down into skin, patient states she mentioned this to Dr.Hopper at her last week.  Patient would like [name],[name]  Any except Tuesday, and perfer morning appointment .    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

## 2010-12-07 NOTE — Progress Notes (Signed)
Summary: rx for antibiotics  Phone Note Call from Patient Call back at Home Phone [phone]   Caller: Patient Summary of Call: pt left VM that she just had a heart valve replacement and everyone in her house is sick. pt would like to get a rx for a ATB to prevent her from catching what everyone else has in the house. called  pt back pt denies any symptoms as of now just wanted to get med for a preventative. Advise pt to practice good handwashing and make sure anyone that is coughing is covering mouth and to make sure that they are using a disinfect spray to disinfect area and materials. pt ok .........Marland KitchenFelecia Deloach CMA  December 02, 2009 11:24 AM

## 2010-12-07 NOTE — Progress Notes (Signed)
Summary: Hard breathing and weight gain  Phone Note Call from Patient Call back at Home Phone [phone]   Summary of Call: Patient left message on triage that she has started new DM med and has been breathing hard at night (and when exercising) and weight gain. Patient finished her Metformin then began the Janumet. She notes that she has gained 6 pounds. Patient would like to know if she should be seen sooner than 2/9 or if MD has other recommendation. Please advise. Initial call taken by: Lucious Groves CMA,  November 22, 2010 8:57 AM  Follow-up for Phone Call        Actos would cause weight gain, not Janumet. She neds checked for other causes Follow-up by: Marga Melnick MD,  November 22, 2010 12:47 PM  Additional Follow-up for Phone Call Additional follow up Details #1::        Patient notified and will keep her upcoming appt. Additional Follow-up by: Lucious Groves CMA,  November 22, 2010 3:03 PM

## 2010-12-07 NOTE — Progress Notes (Signed)
Summary: pt needs 90day supply   Phone Note Refill Request Call back at Home Phone [phone] Message from:  Patient on [facility] [address]  Refills Requested: Medication #1:  FUROSEMIDE 40 MG  TABS 1 by mouth once daily Initial call taken by: Omer Jack,  May 05, 2010 11:25 AM Caller: Patient    Prescriptions: FUROSEMIDE 40 MG  TABS (FUROSEMIDE) 1 by mouth once daily  #90 x 3   Entered by:   Kem Parkinson   Authorized by:   Colon Branch, MD, [facility]   Signed by:   Kem Parkinson on 05/05/2010   Method used:   Electronically to        [facility] [address] (retail)       [phone]       [place], [place]  [address]       Ph: [phone]       Fax: [phone]   RxID:   [id number]

## 2010-12-07 NOTE — Progress Notes (Signed)
Summary: [facility]   [facility]   Imported By: Roderic Ovens 12/16/2009 15:15:51  _____________________________________________________________________  External Attachment:    Type:   Image     Comment:   External Document

## 2010-12-07 NOTE — Procedures (Signed)
Summary: [facility]   Imported By: Sherian Rein 11/20/2010 14:50:11  _____________________________________________________________________  External Attachment:    Type:   Image     Comment:   External Document

## 2010-12-07 NOTE — Letter (Signed)
Summary: [facility]  [facility]   Imported By: Lanelle Bal 05/17/2010 10:43:58  _____________________________________________________________________  External Attachment:    Type:   Image     Comment:   External Document

## 2010-12-07 NOTE — Assessment & Plan Note (Signed)
Summary: FOR WHEEZING//PH   Vital Signs:  Patient profile:   70 year old female Weight:      266.6 pounds BMI:     48.94 Temp:     97.9 degrees F oral Pulse rate:   73 / minute Resp:     19 per minute BP sitting:   128 / 74  (left arm) Cuff size:   large  Vitals Entered By: Shonna Chock CMA (August 28, 2010 9:02 AM) CC: Ongoing wheezing and cough a little better but still ongoing, COPD follow-up   Primary Care Provider:  Marga Melnick MD  CC:  Ongoing wheezing and cough a little better but still ongoing and COPD follow-up.  History of Present Illness: Her Cardiologist referred her for ongoing cough & wheezing .  The patient reports wheezing, cough, and exercise induced symptoms, but denies shortness of breath, chest tightness, increased sputum, and nocturnal awakening.  The patient reports no limitation in activities.   No fever , chills or sweats. FBS 144 this am.  Current Medications (verified): 1)  Multivitamins  Tabs (Multiple Vitamin) .Marland Kitchen.. 1 By Mouth Qd 2)  Lisinopril 40 Mg Tabs (Lisinopril) .Marland Kitchen.. 1 By Mouth Once Daily -Per Patient Cardiology Changed 3)  Crestor 20 Mg Tabs (Rosuvastatin Calcium) .Marland Kitchen.. 1 By Mouth Once Daily 4)  Metformin Hcl 1000 Mg Tabs (Metformin Hcl) .Marland Kitchen.. 1 By Mouth Two Times A Day 5)  Chromium 200 Mcg Tabs (Chromium) .Marland Kitchen.. 1 Tab By Mouth Once Daily 6)  Calcium-Magnesium 500-250 Mg Tabs (Calcium-Magnesium) .Marland Kitchen.. 1 Tab By Mouth Once Daily 7)  Aspirin 81 Mg Tabs (Aspirin) .Marland Kitchen.. 1 By Mouth Once Daily 8)  Furosemide 40 Mg  Tabs (Furosemide) .... 2by Mouth Once Daily 9)  Klor-Con M20 20 Meq  Tbcr (Potassium Chloride Crys Cr) .... 2 By Mouth Once Daily 10)  Onetouch Lancets   Misc (Lancets) .... Test Once Daily Dx: 250.00 11)  Onetouch Test   Strp (Glucose Blood) .... Tests One Time A Day Dx:250.00 12)  Advair Diskus 100-50 Mcg/dose Misc (Fluticasone-Salmeterol) .Marland Kitchen.. 1 Inhalation Every 12 Hrs ; Gargle & Swallow After Use 13)  Nu-Iron 150 Mg Caps (Polysaccharide  Iron Complex) .Marland Kitchen.. 1 By Mouth Once Daily  Allergies: 1)  Septra  Review of Systems ENT:  Denies nasal congestion and sore throat; No headache , facial pain or purulence. CV:  Denies palpitations.  Physical Exam  General:  in no acute distress; alert,appropriate and cooperative throughout examination Lungs:  Normal respiratory effort, chest expands symmetrically. Lungs : diffuse low grade  wheezes with  NP cough. Heart:  normal rate, regular rhythm, no gallop, no rub, no JVD, and grade 1 /6 systolic murmur.  Mechanical click   Impression & Recommendations:  Problem # 1:  BRONCHITIS-ACUTE (ICD-466.0)  RAD component Her updated medication list for this problem includes:    Advair Diskus 100-50 Mcg/dose Misc (Fluticasone-salmeterol) .Marland Kitchen... 1 inhalation every 12 hrs ; gargle & swallow after use    Dulera 200-5 Mcg/act Aero (Mometasone furo-formoterol fum) .Marland Kitchen... 2 puffs every 12 hrs; gargle & spit after use    Singulair 10 Mg Tabs (Montelukast sodium) .Marland Kitchen... 1 once daily    Azithromycin 250 Mg Tabs (Azithromycin) .Marland Kitchen... 1 by mouth once daily x 6 days (do not take 2 pills on day 1)  Orders: T-2 View CXR (71020TC)  Complete Medication List: 1)  Multivitamins Tabs (Multiple vitamin) .Marland Kitchen.. 1 by mouth qd 2)  Crestor 20 Mg Tabs (Rosuvastatin calcium) .Marland Kitchen.. 1 by mouth once daily 3)  Metformin Hcl 1000 Mg Tabs (Metformin hcl) .Marland Kitchen.. 1 by mouth two times a day 4)  Chromium 200 Mcg Tabs (Chromium) .Marland Kitchen.. 1 tab by mouth once daily 5)  Calcium-magnesium 500-250 Mg Tabs (Calcium-magnesium) .Marland Kitchen.. 1 tab by mouth once daily 6)  Aspirin 81 Mg Tabs (Aspirin) .Marland Kitchen.. 1 by mouth once daily 7)  Furosemide 40 Mg Tabs (Furosemide) .... 2by mouth once daily 8)  Klor-con M20 20 Meq Tbcr (Potassium chloride crys cr) .... 2 by mouth once daily 9)  Onetouch Lancets Misc (Lancets) .... Test once daily dx: 250.00 10)  Onetouch Test Strp (Glucose blood) .... Tests one time a day dx:250.00 11)  Advair Diskus 100-50 Mcg/dose  Misc (Fluticasone-salmeterol) .Marland Kitchen.. 1 inhalation every 12 hrs ; gargle & swallow after use 12)  Nu-iron 150 Mg Caps (Polysaccharide iron complex) .Marland Kitchen.. 1 by mouth once daily 13)  Losartan Potassium 100 Mg Tabs (Losartan potassium) .Marland Kitchen.. 1 once daily in place of lisinopril 14)  Dulera 200-5 Mcg/act Aero (Mometasone furo-formoterol fum) .... 2 puffs every 12 hrs; gargle & spit after use 15)  Singulair 10 Mg Tabs (Montelukast sodium) .Marland Kitchen.. 1 once daily 16)  Azithromycin 250 Mg Tabs (Azithromycin) .Marland Kitchen.. 1 by mouth once daily x 6 days (do not take 2 pills on day 1)  Patient Instructions: 1)  Drink as much  NON dairy fluid as you can tolerate for the next few days. Stop Advair & Lisinopril.  Prescriptions: AZITHROMYCIN 250 MG TABS (AZITHROMYCIN) 1 by mouth once daily x 6 days (DO NOT TAKE 2 PILLS ON DAY 1)  #6 x 0   Entered by:   Shonna Chock CMA   Authorized by:   Marga Melnick MD   Signed by:   Shonna Chock CMA on 08/28/2010   Method used:   Electronically to        [facility] [address] (retail)       [phone]       [place], [place]  [address]       Ph: [phone]       Fax: [phone]   RxID:   [id number] SINGULAIR 10 MG TABS (MONTELUKAST SODIUM) 1 once daily  #14 x 0   Entered and Authorized by:   Marga Melnick MD   Signed by:   Marga Melnick MD on 08/28/2010   Method used:   Samples Given   RxID:   [id number] DULERA 200-5 MCG/ACT AERO (MOMETASONE FURO-FORMOTEROL FUM) 2 puffs every 12 hrs; gargle & spit after use  #1 x 0   Entered and Authorized by:   Marga Melnick MD   Signed by:   Marga Melnick MD on 08/28/2010   Method used:   Samples Given   RxID:   [id number] LOSARTAN POTASSIUM 100 MG TABS (LOSARTAN POTASSIUM) 1 once daily in place of Lisinopril  #30 x 5   Entered and Authorized by:   Marga Melnick MD   Signed by:   Marga Melnick MD on 08/28/2010   Method used:   Print then Give to Patient   RxID:    [id number]    Orders Added: 1)  Est. Patient Level III [51761] 2)  T-2 View CXR [71020TC]

## 2010-12-07 NOTE — Assessment & Plan Note (Signed)
Summary: pain upper back/cbs   Vital Signs:  Patient profile:   70 year old female Weight:      263.6 pounds Temp:     98.0 degrees F oral Pulse rate:   60 / minute Resp:     17 per minute BP sitting:   128 / 80  (left arm) Cuff size:   large  Vitals Entered By: Shonna Chock (Mar 30, 2010 2:48 PM) CC: Upper back pain and refill meds x 3 months, Back pain Comments REVIEWED MED LIST, PATIENT AGREED DOSE AND INSTRUCTION CORRECT    Primary Care Provider:  Marga Melnick MD  CC:  Upper back pain and refill meds x 3 months and Back pain.  History of Present Illness:  Back Pain      This is a 70 year old woman who presents with upper back pain for > 7 days.  The patient denies fever, chills, weakness, loss of sensation, fecal incontinence, urinary incontinence, urinary retention, dysuria, rest pain, and inability to care for self.  The pain is located in the left thoracic back w/o radiation.  The pain began at home and gradually w/o trigger or injury.  The pain is made worse by lying down & deep breathing.  The pain is made better by acetaminophen.  Risk factors for serious underlying conditions include age >= 50 years. Review of EMR reveals pleuritic pain in context  of bronchitis with cough in 12/2009 apparently responsive to Doxycycline. She has fallen recently but the pleuritic pain was present prior to fall.                                                                                                                                         DM control "good"; FBS < 130; no hypoglycemia.  Allergies: 1)  Septra  Past History:  Past Medical History: HYPERTENSION, ESSENTIAL NOS (ICD-401.9) CHF (ICD-428.0) AORTIC STENOSIS (ICD-424.1)moderate to severe;  mean grad 43, peak 65, peak velocity 4.03.   HYPERLIPIDEMIA (ICD-272.4) CAROTID BRUIT (ICD-785.9) GERD (ICD-530.81) OSTEOARTHRITIS (ICD-715.90) EDEMA (ICD-782.3) HEMOPTYSIS (ICD-786.3) DYSMETABOLIC SYNDROME X (ICD-277.7) BREAST  CANCER, HX OF (ICD-V10.3)hx of 2009 LOW BACK PAIN SYNDROME (ICD-724.2) DIABETES MELLITUS, TYPE II (ICD-250.00) FAMILY HISTORY DIABETES 1ST DEGREE RELATIVE (ICD-V18.0) ANEMIA (ICD-285.9) OBESITY, MORBID (ICD-278.01)  Past Surgical History: G 5 P 4; Cath neg 2002 Hysterectomy & BSO 2003 for dysfunctional bleeding, Dr Ambrose Mantle Lumpectomy 2009,Dr Carolynne Edouard, Radiation Tubal ligation Colon polypectomy 2004, Dr Russella Dar Total knee replacement, bilat, Dr Charlann Boxer Aortic Ramond Marrow replacement   Left breast re-excision of inferior margin and replacement of cavity evaluation device with MammoSite.   Review of Systems General:  Denies sweats. ENT:  Denies difficulty swallowing and hoarseness; No frontal headache or facial pain. CV:  Denies difficulty breathing at night, difficulty breathing while lying down, swelling of feet, and swelling of hands. Resp:  Complains of chest pain with inspiration and pleuritic; denies cough, coughing  up blood, shortness of breath, sputum productive, and wheezing. Derm:  Denies lesion(s), poor wound healing, and rash. Neuro:  Denies numbness and tingling. Endo:  Complains of excessive urination; denies excessive hunger and excessive thirst; Polyuria especially with Lasix.  Physical Exam  General:  in no acute distress; alert,appropriate and cooperative throughout examination Chest Wall:  No pain to compression Lungs:  Normal respiratory effort, chest expands symmetrically. Lungs are clear to auscultation, no crackles or wheezes. Heart:  normal rate, regular rhythm, no gallop, no rub, no JVD, no HJR, and grade 1.5-2  /6 systolic murmur.Mechanical click   Abdomen:  Bowel sounds positive,abdomen soft and non-tender without masses, organomegaly or hernias noted. Protuberant Msk:  No deformity or scoliosis noted of thoracic or lumbar spine.  No pain to percussion Pulses:  R and L carotid,radial,dorsalis pedis and posterior tibial pulses are full and equal bilaterally. Carotid radiation  of murmur Extremities:  No clubbing, cyanosis, edema. Negative Homan's Neurologic:  alert & oriented X3.   Skin:  Intact without suspicious lesions or rashes Cervical Nodes:  No lymphadenopathy noted Axillary Nodes:  No palpable lymphadenopathy Psych:  memory intact for recent and remote, normally interactive, and good eye contact.     Impression & Recommendations:  Problem # 1:  PLEURISY (ICD-511.0) R/O thoracic radiculopathy component Orders: T-2 View CXR (71020TC) Prescription Created Electronically [phone])  Problem # 2:  HYPERTENSION, ESSENTIAL NOS (ICD-401.9) controlled The following medications were removed from the medication list:    Zaroxolyn 2.5 Mg Tabs (Metolazone) ..... One monday wednesday and friday 30 mins before am furosemide Her updated medication list for this problem includes:    Lisinopril 40 Mg Tabs (Lisinopril) .Marland Kitchen... 1 by mouth once daily -per patient cardiology changed    Furosemide 40 Mg Tabs (Furosemide) .Marland Kitchen... 1 by mouth once daily  Problem # 3:  DIABETES MELLITUS, TYPE II (ICD-250.00)  Her updated medication list for this problem includes:    Lisinopril 40 Mg Tabs (Lisinopril) .Marland Kitchen... 1 by mouth once daily -per patient cardiology changed    Glipizide Xl 5 Mg Tb24 (Glipizide) .Marland Kitchen... 1 by mouth qd    Metformin Hcl 1000 Mg Tabs (Metformin hcl) .Marland Kitchen... 1 by mouth two times a day    Aspirin 81 Mg Tabs (Aspirin) .Marland Kitchen... 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 1 by mouth qd 2)  Lisinopril 40 Mg Tabs (Lisinopril) .Marland Kitchen.. 1 by mouth once daily -per patient cardiology changed 3)  Crestor 20 Mg Tabs (Rosuvastatin calcium) .Marland Kitchen.. 1 by mouth once daily 4)  Glipizide Xl 5 Mg Tb24 (Glipizide) .Marland Kitchen.. 1 by mouth qd 5)  Metformin Hcl 1000 Mg Tabs (Metformin hcl) .Marland Kitchen.. 1 by mouth two times a day 6)  Chromium 200 Mcg Tabs (Chromium) .Marland Kitchen.. 1 tab by mouth once daily 7)   Calcium-magnesium 500-250 Mg Tabs (Calcium-magnesium) .Marland Kitchen.. 1 tab by mouth once daily 8)  Aspirin 81 Mg Tabs (Aspirin) .Marland Kitchen.. 1 by mouth once daily 9)  Furosemide 40 Mg Tabs (Furosemide) .Marland Kitchen.. 1 by mouth once daily 10)  Klor-con M20 20 Meq Tbcr (Potassium chloride crys cr) .Marland Kitchen.. 1 by mouth once daily 11)  Onetouch Lancets Misc (Lancets) .... Test once daily 12)  Onetouch Test Strp (Glucose blood) .... Tests one time a day 13)  Advair Diskus 100-50 Mcg/dose Misc (Fluticasone-salmeterol) .Marland Kitchen.. 1 inhalation every 12 hrs ; gargle & swallow after use 14)  Nu-iron 150  Mg Caps (Polysaccharide iron complex) .Marland Kitchen.. 1 by mouth once daily 15)  Tramadol Hcl 50 Mg Tabs (Tramadol hcl) .Marland Kitchen.. 1 every 6 hrs as needed  Patient Instructions: 1)  Report fever or sputum as discussed Prescriptions: LISINOPRIL 40 MG TABS (LISINOPRIL) 1 by mouth once daily -Per patient Cardiology changed  #90 x 1   Entered by:   Shonna Chock   Authorized by:   Marga Melnick MD   Signed by:   Shonna Chock on 03/30/2010   Method used:   Print then Give to Patient   RxID:   [id number] GLIPIZIDE XL 5 MG TB24 (GLIPIZIDE) 1 by mouth qd  #90 Each x 1   Entered and Authorized by:   Marga Melnick MD   Signed by:   Shonna Chock on 03/30/2010   Method used:   Print then Give to Patient   RxID:   [id number] METFORMIN HCL 1000 MG TABS (METFORMIN HCL) 1 by mouth two times a day  #180 x 1   Entered and Authorized by:   Marga Melnick MD   Signed by:   Shonna Chock on 03/30/2010   Method used:   Print then Give to Patient   RxID:   [id number] CRESTOR 20 MG TABS (ROSUVASTATIN CALCIUM) 1 by mouth once daily  #90 x 1   Entered and Authorized by:   Marga Melnick MD   Signed by:   Shonna Chock on 03/30/2010   Method used:   Print then Give to Patient   RxID:   [id number] TRAMADOL HCL 50 MG TABS (TRAMADOL HCL) 1 every 6 hrs as needed  #30 x 1   Entered and Authorized by:   Marga Melnick MD   Signed by:   Marga Melnick MD  on 03/30/2010   Method used:   Faxed to ...       [facility] [address] (retail)       [phone]       [place], [place]  [address]       Ph: [phone]       Fax: [phone]   RxID:   [id number]

## 2010-12-07 NOTE — Miscellaneous (Signed)
Summary: [facility]  [facility]   Imported By: Roderic Ovens 02/07/2010 11:08:27  _____________________________________________________________________  External Attachment:    Type:   Image     Comment:   External Document

## 2010-12-07 NOTE — Assessment & Plan Note (Signed)
Summary: coughing/kdc   Vital Signs:  Patient profile:   70 year old female Weight:      260.4 pounds BMI:     47.80 Temp:     97.8 degrees F oral Pulse rate:   72 / minute Resp:     17 per minute BP sitting:   128 / 70  (left arm) Cuff size:   large  Vitals Entered By: Shonna Chock (December 13, 2009 4:21 PM) CC: 1.) Cough  2.) Refill meds  Comments REVIEWED MED LIST, PATIENT AGREED DOSE AND INSTRUCTION CORRECT    Primary Care Provider:  Iasha Nunez  CC:  1.) Cough  2.) Refill meds .  History of Present Illness: Onset 12/03/2009 after exposure to sick grandchildren & husband. UC Rxed Zpack  on that day with initial improvement. CXray 02/04 was negative for PNA. Now LU back pain with cough; Rx cough syrup helps. Not using Advair . She did not get flu shot.  Allergies: 1)  Septra  Review of Systems General:  Denies chills, fever, and sweats. ENT:  No facial pain , frontal headache or purulence. CV:  Denies difficulty breathing at night and difficulty breathing while lying down. Resp:  Complains of cough and sputum productive; denies chest pain with inspiration, coughing up blood, shortness of breath, and wheezing.  Physical Exam  General:  in no acute distress; alert,appropriate and cooperative throughout examination Lungs:  Normal respiratory effort, chest expands symmetrically. Lungs : minimal  wheezes. No rub. No increased WOB Heart:  normal rate, regular rhythm, and grade 1 /6 systolic murmur. Mechanical click  Cervical Nodes:  No lymphadenopathy noted Axillary Nodes:  No palpable lymphadenopathy   Impression & Recommendations:  Problem # 1:  BRONCHITIS-ACUTE (ICD-466.0) ? RAD component Her updated medication list for this problem includes:    Advair Diskus 100-50 Mcg/dose Misc (Fluticasone-salmeterol) .Marland Kitchen... 1 inhalation every 12 hrs ; gargle & swallow after use    Doxycycline Hyclate 100 Mg Caps (Doxycycline hyclate) .Marland Kitchen... 1 two times a day x 2 days then 1 once  daily  Problem # 2:  PLEURISY (ICD-511.0) cough induced  Complete Medication List: 1)  Multivitamins Tabs (Multiple vitamin) .Marland Kitchen.. 1 by mouth qd 2)  Lisinopril 20 Mg Tabs (Lisinopril) .Marland Kitchen.. 1 by mouth once daily 3)  Crestor 20 Mg Tabs (Rosuvastatin calcium) .Marland Kitchen.. 1 by mouth once daily-office visit and labs due 4)  Glipizide Xl 5 Mg Tb24 (Glipizide) .Marland Kitchen.. 1 by mouth qd 5)  Metformin Hcl 1000 Mg Tabs (Metformin hcl) .Marland Kitchen.. 1 by mouth two times a day-office visit and labs due 6)  Chromium 200 Mcg Tabs (Chromium) .Marland Kitchen.. 1 tab by mouth once daily 7)  Calcium-magnesium 500-250 Mg Tabs (Calcium-magnesium) .Marland Kitchen.. 1 tab by mouth once daily 8)  Aspirin Ec 325 Mg Tbec (Aspirin) .... Take one tablet by mouth daily 9)  Furosemide 40 Mg Tabs (Furosemide) .... 2 by mouth once daily 10)  Klor-con M20 20 Meq Tbcr (Potassium chloride crys cr) .... 2 by mouth once daily 11)  Onetouch Lancets Misc (Lancets) .... Test once daily 12)  Onetouch Test Strp (Glucose blood) .... Tests one time a day 13)  Advair Diskus 100-50 Mcg/dose Misc (Fluticasone-salmeterol) .Marland Kitchen.. 1 inhalation every 12 hrs ; gargle & swallow after use 14)  Zaroxolyn 2.5 Mg Tabs (Metolazone) .... One monday wednesday and friday 30 mins before am furosemide 15)  Hydrocodone-acetaminophen 5-500 Mg Tabs (Hydrocodone-acetaminophen) .... As needed 16)  Hycodan  .Marland Kitchen.. 1-2 tsp q 6 hours as needed cough  17)  Nu-iron 150 Mg Caps (Polysaccharide iron complex) .Marland Kitchen.. 1 by mouth once daily 18)  Doxycycline Hyclate 100 Mg Caps (Doxycycline hyclate) .Marland Kitchen.. 1 two times a day x 2 days then 1 once daily 19)  Tramadol Hcl 50 Mg Tabs (Tramadol hcl) .Marland Kitchen.. 1 q 6 hrs as needed for  pain  Other Orders: Future Orders: Flu Vaccine 65yrs + (16109) ... 12/15/2009 Administration Flu vaccine - MCR (G0008) ... 12/15/2009 Flu Vaccine Consent Questions     Do you have a history of severe allergic reactions to this vaccine? no    Any prior history of allergic reactions to egg and/or  gelatin? no    Do you have a sensitivity to the preservative Thimersol? no    Do you have a past history of Guillan-Barre Syndrome? no    Do you currently have an acute febrile illness? no    Have you ever had a severe reaction to latex? no    Vaccine information given and explained to patient? yes    Are you currently pregnant? no    Lot Number:[id number]   Exp Date:05/04/2010   Site Given Right Deltoid IMcycline Hyclate 100 Mg Caps (Doxycycline hyclate) .Marland Kitchen.. 1 two times a day x 2 days then 1 once daily 19)  Tramadol Hcl 50 Mg Tabs (Tramadol hcl) .Marland Kitchen.. 1 q 6 hrs as needed for  pain  Other Orders: Future Orders: Flu Vaccine 80yrs + (60454) ... 12/15/2009 Administration Flu vaccine - MCR (G0008) ... 12/15/2009 .lbmedflu    Patient Instructions: 1)  Drink as much fluid as you can tolerate for the next few days. Please schedule fasting labs. Prescriptions: CRESTOR 20 MG TABS (ROSUVASTATIN CALCIUM) 1 by mouth once daily-OFFICE VISIT AND LABS DUE  #30 x 0   Entered by:   Shonna Chock   Authorized by:   Marga Melnick MD   Signed by:   Shonna Chock on 12/13/2009   Method used:   Faxed to ...       [facility] [address] (retail)       [phone]       [place], [place]  [address]       Ph: [phone]       Fax: [phone]   RxID:   [id number] TRAMADOL HCL 50 MG TABS (TRAMADOL HCL) 1 q 6 hrs as needed for  pain  #30 x 1   Entered and Authorized by:   Marga Melnick MD   Signed by:   Marga Melnick MD on 12/13/2009   Method used:   Faxed to ...       [facility] [address] (retail)       [phone]       [place], [place]  [address]       Ph: [phone]       Fax: [phone]   RxID:   [id number] DOXYCYCLINE HYCLATE 100 MG CAPS (DOXYCYCLINE HYCLATE) 1 two times a day X 2 days then 1 once daily  #12 x 0   Entered and Authorized by:   Marga Melnick MD   Signed by:   Marga Melnick MD on 12/13/2009    Method used:   Faxed to ...       [facility] [address] (retail)       [phone]       [facility]  [place], [place]  [address]       Ph: [phone]       Fax: [phone]   RxID:   [id number]

## 2010-12-07 NOTE — Miscellaneous (Signed)
Summary: Orders Update  Clinical Lists Changes  Medications: Added new medication of ZAROXOLYN 2.5 MG TABS (METOLAZONE) ONE MONDAY WEDNESDAY AND FRIDAY 30 MINS BEFORE AM FUROSEMIDE

## 2010-12-07 NOTE — Assessment & Plan Note (Signed)
Summary: Gastroenterology  [facility]  MR#:  [id number] Page #  [facility]   GASTROENTEROLOGY OFFICE NOTE  NAME:  [name], [place]   OFFICE NO:  [id number]  DATE:  05/19/03  DOB:  20-Nov-2040  REFERRING PHYSICIAN:  Dr. Marga Melnick.  REASON FOR REFERRAL:  Chronic constipation and history of adenomatous colon polyp.  HISTORY OF PRESENT ILLNESSS:  The patient is a 70 year old female that I have previously seen.  She has a history of an adenomatous colon polyp removed in October of 2000, small internal hemorrhoids and external hemorrhoidal tags were noted on that exam as well.  She does have chronic constipation and recently she says she has had some mild diarrhea secondary to the stress of placing her mother-in-law in a nursing home.  She has a niece with colon cancer and colon polyps.  She recently had stool Hemoccults that were negative.  ALLERGIES:  None known.  MEDICATIONS:  Medications are listed on the chart, updated and reviewed.  PAST MEDICAL HISTORY:  Hypertension, hyperlipidemia, diabetes mellitus, status post hysterectomy.  SOCIAL HISTORY:  She occasionally drinks alcohol and denies tobacco usage.  She is married with four children.  REVIEW OF SYSTEMS:  As per the handwritten form.  PHYSICAL EXAMINATION:  In no acute distress.   The weight is 259 pounds.  The height is 5 feet 4 inches.  The blood pressure is 124/68.  The pulse is 68 and regular.  HEENT exam: Anicteric sclerae.  The oropharynx is clear.  Chest: The chest is clear to auscultation and percussion.  Cardiac: Regular rate and rhythm without murmurs.  Abdomen: The abdomen is large, soft, nontender, nondistended.  Normal active bowel sounds.  There are no palpable organomegaly, masses or hernias.  Rectal:  Exam deferred to the time of colonoscopy.  Neurologic: Grossly nonfocal.    ASSESSMENT AND PLAN:  Personal history of adenomatous colon polyps and ongoing constipation, recent change in bowel habits, likely functional.   Maintain a high fiber diet and adequate fluid intake.    The risks, benefits and alternatives to colonoscopy with possible biopsy and possible polypectomy were discussed with the patient and she consents to proceed.  This will be scheduled electively.      Venita Lick. Russella Dar, M.D., F.A.C.G.  [id number] cc:  Dr. Marga Melnick D:  05/19/03; T:  ; Job [id number]

## 2010-12-07 NOTE — Assessment & Plan Note (Signed)
Summary: 52month Follow-up on labs/scm   Vital Signs:  Patient profile:   70 year old female Weight:      268.8 pounds BMI:     49.34 O2 Sat:      95 % Temp:     97.6 degrees F oral Pulse rate:   64 / minute Resp:     17 per minute BP sitting:   130 / 70  (left arm) Cuff size:   large  Vitals Entered By: Shonna Chock CMA (September 11, 2010 9:39 AM) CC: 3 month follow-up ( copy of chest xray and labs given), Type 2 diabetes mellitus follow-up, COPD follow-up   Primary Care Provider:  Marga Melnick MD  CC:  3 month follow-up ( copy of chest xray and labs given), Type 2 diabetes mellitus follow-up, and COPD follow-up.  History of Present Illness: Type 2 Diabetes Mellitus Follow-Up      This is a 70 year old woman who presents for Type 2 diabetes mellitus follow-up.  The patient reports polyuria, polydipsia, and numbness of extremities (hands in am), but denies blurred vision, self managed hypoglycemia, weight loss, and weight gain.  Other symptoms include chest pain in L inferior rib cage " only with sitting". CXray reviewed ; RML PNA improved . Pathophysiology of chronic RML changes  post PNA & modified IS with balloons or Incentive Spirometry from hospital discussed  .  The patient denies the following symptoms: neuropathic pain, vomiting, orthostatic symptoms, poor wound healing, intermittent claudication, vision loss, and foot ulcer.  Since the last visit the patient reports fair dietary compliance and exercising regularly as water aerobics & Elliptical.  The patient has been measuring capillary blood glucose before breakfast (in 150s off Glimiperide). A1c 7.4( average sugar 1666, risk 48% long term, these were discussed) Since the last visit, the patient reports having had no eye care and no foot care.   PNA  Follow-Up      The patient also presents for follow-up post PNA.  The patient denies chest tightness, wheezing, cough, increased sputum, and nocturnal awakening.  Medication use  includes controller med daily. 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   Current Medications (verified): 1)  Multivitamins  Tabs (Multiple Vitamin) .Marland Kitchen.. 1 By Mouth Qd 2)  Crestor 20 Mg Tabs (Rosuvastatin Calcium) .Marland Kitchen.. 1 By Mouth Once Daily 3)  Metformin Hcl 1000 Mg Tabs (Metformin Hcl) .Marland Kitchen.. 1 By Mouth Two Times A Day 4)  Chromium 200 Mcg Tabs (Chromium) .Marland Kitchen.. 1 Tab By Mouth Once Daily 5)  Calcium-Magnesium 500-250 Mg Tabs (Calcium-Magnesium) .Marland Kitchen.. 1 Tab By Mouth Once Daily 6)  Aspirin 81 Mg Tabs (Aspirin) .Marland Kitchen.. 1 By Mouth Once Daily 7)  Furosemide 40 Mg  Tabs (Furosemide) .... 2by Mouth Once Daily 8)  Klor-Con M20 20 Meq  Tbcr (Potassium Chloride Crys Cr) .... 2 By Mouth Once Daily 9)  Onetouch Lancets   Misc (Lancets) .... Test Once Daily Dx: 250.00 10)  Onetouch Test   Strp (Glucose Blood) .... Tests One Time A Day Dx:250.00 11)  Advair Diskus 100-50 Mcg/dose Misc (Fluticasone-Salmeterol) .Marland Kitchen.. 1 Inhalation Every 12 Hrs ; Gargle & Swallow After Use 12)  Nu-Iron 150 Mg Caps (Polysaccharide Iron Complex) .Marland Kitchen.. 1 By Mouth Once Daily 13)  Losartan Potassium 100 Mg Tabs (Losartan Potassium) .Marland Kitchen.. 1 Once Daily in Place of Lisinopril 14)  Dulera 200-5 Mcg/act Aero (Mometasone Furo-Formoterol Fum) .... 2 Puffs Every 12 Hrs; Gargle & Spit After Use 15)  Tramadol Hcl 50  Mg Tabs (Tramadol Hcl) .Marland Kitchen.. 1 By Mouth Every 6 Hours As Needed  Allergies: 1)  Septra  Physical Exam  General:  well-nourished,in no acute distress; alert,appropriate and cooperative throughout examination Lungs:  Normal respiratory effort, chest expands symmetrically. Lungs are clear to auscultation, no crackles or wheezes. No increased WOB  Heart:  normal rate, regular rhythm, no gallop, no rub, no JVD, and grade 1.5 /6 systolic murmur.   Pulses:  R and L carotid,radial  pulses are full and equal bilaterally. Decreased pedal pulses. Carotid bruits vs. radiation of murmur Extremities:  No clubbing, cyanosis, edema. Good  nail health Neurologic:  alert & oriented X3 and sensation intact to light touch over feet.   Skin:  Intact without suspicious lesions or rashes Cervical Nodes:  No lymphadenopathy noted Axillary Nodes:  No palpable lymphadenopathy Psych:  memory intact for recent and remote, normally interactive, and good eye contact.     Impression & Recommendations:  Problem # 1:  DIABETES MELLITUS, UNCONTROLLED (ICD-250.02)  The following medications were removed from the medication list:    Metformin Hcl 1000 Mg Tabs (Metformin hcl) .Marland Kitchen... 1 by mouth two times a day Her updated medication list for this problem includes:    Aspirin 81 Mg Tabs (Aspirin) .Marland Kitchen... 1 by mouth once daily    Losartan Potassium 100 Mg Tabs (Losartan potassium) .Marland Kitchen... 1 once daily in place of lisinopril    Janumet 50-1000 Mg Tabs (Sitagliptin-metformin hcl) .Marland Kitchen... 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 1 by mouth qd 2)  Crestor 20 Mg Tabs (Rosuvastatin calcium) .Marland Kitchen.. 1 by mouth once daily 3)  Chromium 200 Mcg Tabs (Chromium) .Marland Kitchen.. 1 tab by mouth once daily 4)  Calcium-magnesium 500-250 Mg Tabs (Calcium-magnesium) .Marland Kitchen.. 1 tab by mouth once daily 5)  Aspirin 81 Mg Tabs (Aspirin) .Marland Kitchen.. 1 by mouth once daily 6)  Furosemide 40 Mg Tabs (Furosemide) .... 2by mouth once daily 7)  Klor-con M20 20 Meq Tbcr (Potassium chloride crys cr) .... 2 by mouth once daily 8)  Onetouch Lancets Misc (Lancets) .... Test once daily dx: 250.00 9)  Onetouch Test Strp (Glucose blood) .... Tests one time a day dx:250.00 10)  Advair Diskus 100-50 Mcg/dose Misc (Fluticasone-salmeterol) .Marland Kitchen.. 1 inhalation every 12 hrs ; gargle & swallow after use 11)  Nu-iron 150 Mg Caps (Polysaccharide iron complex) .Marland Kitchen.. 1 by mouth once daily 12)  Losartan Potassium 100 Mg Tabs (Losartan potassium) .Marland Kitchen.. 1 once daily  in place of lisinopril 13)  Dulera 200-5 Mcg/act Aero (Mometasone furo-formoterol fum) .... 2 puffs every 12 hrs; gargle & spit after use 14)  Tramadol Hcl 50 Mg Tabs (Tramadol hcl) .Marland Kitchen.. 1 by mouth every 6 hours as needed 15)  Janumet 50-1000 Mg Tabs (Sitagliptin-metformin hcl) .Marland Kitchen.. 1 two times a day with 2 largest meals  Patient Instructions: 1)  Incentive Spirometry @ least 6 X /day. Dulera 1 pufff two times a day until sample gone. 2)  Please schedule a follow-up appointment in 3 months. 3)  BUN,creat, K+ prior to visit, ICD-9:250.02 4)  HbgA1C prior to visit, ICD-9:250.02 5)  Urine Microalbumin prior to visit, ICD-9:250.02 6)  Check your blood sugars regularly. If your readings are usually above :150 or below 90 you should contact our office. 7)  See your eye doctor yearly to check for diabetic eye damage. 8)  Check your feet each night for sore areas, calluses or signs of infection.Please consider Podiatry referral. Prescriptions: JANUMET 50-1000 MG TABS (SITAGLIPTIN-METFORMIN HCL) 1 two times a day with 2 largest meals  #60 x 5   Entered and Authorized by:   Marga Melnick MD   Signed by:   Marga Melnick MD on 09/11/2010   Method used:   Print then Give to Patient   RxID:   [id number]    Orders Added: 1)  Est. Patient Level IV [56213]

## 2010-12-07 NOTE — Medication Information (Signed)
Summary: Possible Nonadherence with Meds/[employer]  Possible Nonadherence with Meds/[employer]   Imported By: Lanelle Bal 06/26/2010 09:54:12  _____________________________________________________________________  External Attachment:    Type:   Image     Comment:   External Document

## 2010-12-07 NOTE — Assessment & Plan Note (Signed)
Summary: poss hem card...as.   History of Present Illness Visit Type: follow up Primary GI MD: Elie Goody MD [facility] Primary Provider: Marga Melnick MD Requesting Provider: Tracey Harries, MD Chief Complaint: Hem + stool,  chronic constipation History of Present Illness:   [name] is a 70 year old female who was found to have Hemoccult-positive stool by Dr Ambrose Mantle. She has a prior history of adenomatous colon polyps and last underwent colonoscopy in August 2009. She has known external hemorrhoids. She has chronic constipation, which has been stable. She complains of shortness of breath at night for the past several weeks and has been considering calling Dr. Eden Emms. She also recently started on Janumet.   GI Review of Systems      Denies abdominal pain, acid reflux, belching, bloating, chest pain, dysphagia with liquids, dysphagia with solids, heartburn, loss of appetite, nausea, vomiting, vomiting blood, weight loss, and  weight gain.      Reports constipation and  heme positive stool.     Denies anal fissure, black tarry stools, change in bowel habit, diarrhea, diverticulosis, fecal incontinence, hemorrhoids, irritable bowel syndrome, jaundice, light color stool, liver problems, rectal bleeding, and  rectal pain.   Current Medications (verified): 1)  Multivitamins  Tabs (Multiple Vitamin) .Marland Kitchen.. 1 By Mouth Qd 2)  Crestor 20 Mg Tabs (Rosuvastatin Calcium) .Marland Kitchen.. 1 By Mouth Once Daily 3)  Chromium 200 Mcg Tabs (Chromium) .Marland Kitchen.. 1 Tab By Mouth Once Daily 4)  Calcium-Magnesium 500-250 Mg Tabs (Calcium-Magnesium) .Marland Kitchen.. 1 Tab By Mouth Once Daily 5)  Aspirin 81 Mg Tabs (Aspirin) .Marland Kitchen.. 1 By Mouth Once Daily 6)  Furosemide 40 Mg  Tabs (Furosemide) .... 2 Tablets By Mouth Once Daily 7)  Klor-Con M20 20 Meq  Tbcr (Potassium Chloride Crys Cr) .... 2 By Mouth Once Daily 8)  Onetouch Lancets   Misc (Lancets) .... Test Once Daily Dx: 250.00 9)  Onetouch Test   Strp (Glucose Blood) .... Tests One Time A  Day Dx:250.00 10)  Losartan Potassium 100 Mg Tabs (Losartan Potassium) .Marland Kitchen.. 1 Once Daily in Place of Lisinopril 11)  Dulera 200-5 Mcg/act Aero (Mometasone Furo-Formoterol Fum) .... 2 Puffs Every 12 Hrs; Gargle & Spit After Use 12)  Tramadol Hcl 50 Mg Tabs (Tramadol Hcl) .Marland Kitchen.. 1 By Mouth Every 6 Hours As Needed 13)  Janumet 50-1000 Mg Tabs (Sitagliptin-Metformin Hcl) .Marland Kitchen.. 1 Two Times A Day With 2 Largest Meals  Allergies (verified): 1)  Septra  Past History:  Past Medical History: Reviewed history from 11/15/2010 and no changes required. HYPERTENSION, ESSENTIAL NOS (ICD-401.9) CHF (ICD-428.0) AORTIC STENOSIS (ICD-424.1)moderate to severe;  mean grad 43, peak 65, peak velocity 4.03.   HYPERLIPIDEMIA (ICD-272.4) CAROTID BRUIT (ICD-785.9) GERD (ICD-530.81) OSTEOARTHRITIS (ICD-715.90) EDEMA (ICD-782.3) HEMOPTYSIS (ICD-786.3) DYSMETABOLIC SYNDROME X (ICD-277.7) BREAST CANCER, HX OF (ICD-V10.3)hx of 2009 LOW BACK PAIN SYNDROME (ICD-724.2) DIABETES MELLITUS, TYPE II (ICD-250.00) FAMILY HISTORY DIABETES 1ST DEGREE RELATIVE (ICD-V18.0) ANEMIA (ICD-285.9) OBESITY, MORBID (ICD-278.01) Adenomatous Colon Polyps 08/1999 Hemorrhoids  Past Surgical History: Reviewed history from 03/30/2010 and no changes required. G 5 P 4; Cath neg 2002 Hysterectomy & BSO 2003 for dysfunctional bleeding, Dr Ambrose Mantle Lumpectomy 2009,Dr Carolynne Edouard, Radiation Tubal ligation Colon polypectomy 2004, Dr Russella Dar Total knee replacement, bilat, Dr Charlann Boxer Aortic Ramond Marrow replacement   Left breast re-excision of inferior margin and replacement of cavity evaluation device with MammoSite.   Family History: Reviewed history from 11/15/2010 and no changes required. Father: renal failure Mother: CVA,cns aneurysm Siblings: 2 sisters DM; 2 bro DM; 1 bro MI; P aunt  breast CA Family History of Colon Cancer: Neice Family History of Colon Polyps:Neice  Social History: Former Smoker Alcohol use-yes-rare Married Regular  exercise-no Daily Caffeine Use  Review of Systems       The patient complains of arthritis/joint pain and heart murmur.         The pertinent positives and negatives are noted as above and in the HPI. All other ROS were reviewed and were negative.   Vital Signs:  Patient profile:   70 year old female Height:      62 inches Weight:      274.50 pounds BMI:     50.39 Pulse rate:   76 / minute Pulse rhythm:   regular BP sitting:   128 / 82  (left arm) Cuff size:   large  Vitals Entered By: June McMurray CMA Duncan Dull) (November 20, 2010 10:32 AM)  Physical Exam  General:  Well developed, well nourished, no acute distress. obese.   Head:  Normocephalic and atraumatic. Eyes:  PERRLA, no icterus. Ears:  Normal auditory acuity. Mouth:  No deformity or lesions, dentition normal. Lungs:  Clear throughout to auscultation. Heart:  Regular rate and rhythm; no murmurs, rubs,  or bruits. Abdomen:  Soft, nontender and nondistended. No masses, hepatosplenomegaly or hernias noted. Normal bowel sounds. Rectal:  deferred until time of colonoscopy.   Neurologic:  Alert and  oriented x4;  grossly normal neurologically. Psych:  Alert and cooperative. Normal mood and affect.  Impression & Recommendations:  Problem # 1:  FECAL OCCULT BLOOD (ICD-792.1) Possible hemorrhoids or colorectal neoplasms. The risks, benefits and alternatives to colonoscopy with possible biopsy and possible polypectomy were discussed with the patient and they consent to proceed. The procedure will be scheduled electively after clearance by Dr. Eden Emms.  Problem # 2:  PERSONAL HX COLONIC POLYPS (ICD-V12.72) Prior history of adenomatous colon polyps. Surveillance colonoscopy was previously recommended, August 2014. Colonoscopy as above.  Problem # 3:  CONSTIPATION (ICD-564.00) Mild chronic constipation, well-controlled on Colace and dietary fiber.  Problem # 4:  CHF (ICD-428.0) Orthopnea. Further evaluation per Dr. Eden Emms  prior to scheduling colonoscopy.  Other Orders: Cardiology Referral (Cardiology)  Patient Instructions: 1)  We have scheduled a follow-up appt with Dr. Eden Emms for Wednesday January 25th, 2012 at 9:30am. 2)  After your follow-up appt with Dr. Eden Emms, please call back to schedule your Colonoscopy.  3)  Copy sent to : Charlton Haws, MD 4)                         Tracey Harries, MD 5)  The medication list was reviewed and reconciled.  All changed / newly prescribed medications were explained.  A complete medication list was provided to the patient / caregiver.

## 2010-12-07 NOTE — Progress Notes (Signed)
Summary: QUESTIONS ABOUT MEDICATION  Medications Added FUROSEMIDE 40 MG  TABS (FUROSEMIDE) 2 tablets by mouth once daily       Phone Note Call from Patient Call back at [facility] Phone [phone]   Caller: Patient Summary of Call: QUESTIIONS ABOUT MEDICATION Initial call taken by: Judie Grieve,  November 08, 2010 9:54 AM  Follow-up for Phone Call        questions answered Deliah Goody, RN  November 08, 2010 3:33 PM     New/Updated Medications: FUROSEMIDE 40 MG  TABS (FUROSEMIDE) 2 tablets by mouth once daily Prescriptions: FUROSEMIDE 40 MG  TABS (FUROSEMIDE) 2 tablets by mouth once daily  #60 x 12   Entered by:   Deliah Goody, RN   Authorized by:   Colon Branch, MD, [facility]   Signed by:   Deliah Goody, RN on 11/08/2010   Method used:   Electronically to        [facility] [address] (retail)       [phone]       [place], [place]  [address]       Ph: [phone]       Fax: [phone]   RxID:   [id number]

## 2010-12-07 NOTE — Miscellaneous (Signed)
Summary: [facility]  [facility]   Imported By: Roderic Ovens 02/07/2010 11:19:13  _____________________________________________________________________  External Attachment:    Type:   Image     Comment:   External Document

## 2010-12-07 NOTE — Assessment & Plan Note (Signed)
Summary: eph per pt call/lg  Medications Added LISINOPRIL 40 MG TABS (LISINOPRIL) 1/2 tab by mouth once daily FOLIC ACID 1 MG TABS (FOLIC ACID) 1 tab by mouth once daily HYDROCODONE-ACETAMINOPHEN 5-500 MG TABS (HYDROCODONE-ACETAMINOPHEN) as needed      Allergies Added:   Referring Provider:  hopper Primary Provider:  hopper   History of Present Illness: [name] is seen today post AVR.  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  She has had some post op volume overload that has responded to diuretics.  I reviewed her echo today and although it is well seated with no AR the peak velocity through the valve is about 68m/sec.  There does not appear to be a lot of turbulence.  The mitral flow valve should have the equivalent of a 23mm tissue valve.  She is improving with no SSCP, PND, orthopnea.  She has a viral syndrome and went to urgent care for cough medicine and a Z pack.  F/U CXR at Dr Ysidro Evert offic looked much better with resolution of CHF.  She has been compliant with her meds and walking daily  Current Problems (verified): 1)  Other Dyspnea and Respiratory Abnormalities  (ICD-786.09) 2)  Dyspepsia&other Spec Disorders Function Stomach  (ICD-536.8) 3)  Hypertension, Essential Nos  (ICD-401.9) 4)  CHF  (ICD-428.0) 5)  Aortic Stenosis  (ICD-424.1) 6)  Hyperlipidemia  (ICD-272.4) 7)  Carotid Bruit  (ICD-785.9) 8)  Gerd  (ICD-530.81) 9)  Osteoarthritis  (ICD-715.90) 10)  Edema  (ICD-782.3) 11)  Hemoptysis  (ICD-786.3) 12)  Dysmetabolic Syndrome X  (ICD-277.7) 13)  Breast Cancer, Hx of  (ICD-V10.3) 14)  Low Back Pain Syndrome  (ICD-724.2) 15)  Diabetes Mellitus, Type II  (ICD-250.00) 16)  Family History Diabetes 1st Degree Relative  (ICD-V18.0) 17)  Anemia  (ICD-285.9) 18)  Obesity, Morbid  (ICD-278.01)  Current Medications (verified): 1)  Multivitamins  Tabs (Multiple Vitamin) .Marland Kitchen.. 1 By Mouth Qd 2)  Lisinopril 40 Mg Tabs (Lisinopril) .... 1/2 Tab By Mouth  Once Daily 3)  Crestor 20 Mg Tabs (Rosuvastatin Calcium) .Marland Kitchen.. 1 By Mouth Once Daily-Office Visit and Labs Due 4)  Glipizide Xl 5 Mg Tb24 (Glipizide) .Marland Kitchen.. 1 By Mouth Qd 5)  Metformin Hcl 1000 Mg Tabs (Metformin Hcl) .Marland Kitchen.. 1 By Mouth Two Times A Day-Office Visit and Labs Due 6)  Chromium 200 Mcg Tabs (Chromium) .Marland Kitchen.. 1 Tab By Mouth Once Daily 7)  Calcium-Magnesium 500-250 Mg Tabs (Calcium-Magnesium) .Marland Kitchen.. 1 Tab By Mouth Once Daily 8)  Aspirin Ec 325 Mg Tbec (Aspirin) .... Take One Tablet By Mouth Daily 9)  Furosemide 40 Mg  Tabs (Furosemide) .... Qd 10)  Klor-Con M20 20 Meq  Tbcr (Potassium Chloride Crys Cr) .... Take One Tablet Qd 11)  Onetouch Lancets   Misc (Lancets) .... Test Once Daily 12)  Onetouch Test   Strp (Glucose Blood) .... Tests One Time A Day 13)  Advair Diskus 100-50 Mcg/dose Misc (Fluticasone-Salmeterol) .Marland Kitchen.. 1 Inhalation Every 12 Hrs ; Gargle & Swallow After Use 14)  Zaroxolyn 2.5 Mg Tabs (Metolazone) .... One Monday Wednesday and Friday 30 Mins Before Am Furosemide 15)  Folic Acid 1 Mg Tabs (Folic Acid) .Marland Kitchen.. 1 Tab By Mouth Once Daily 16)  Hydrocodone-Acetaminophen 5-500 Mg Tabs (Hydrocodone-Acetaminophen) .... As Needed  Allergies (verified): 1)  Septra  Past History:  Past Medical History: Last updated: 09/19/2009 Current Problems:  HYPERTENSION, ESSENTIAL NOS (ICD-401.9) CHF (ICD-428.0) AORTIC STENOSIS (ICD-424.1)moderate to severe;  mean grad 43, peak 65,  peak velocity 4.03.  Not ideal candidate for surgery due to weight and recent diagnosis of breast cancer HYPERLIPIDEMIA (ICD-272.4) CAROTID BRUIT (ICD-785.9) GERD (ICD-530.81) OSTEOARTHRITIS (ICD-715.90) EDEMA (ICD-782.3) HEMOPTYSIS (ICD-786.3) DYSMETABOLIC SYNDROME X (ICD-277.7) BREAST CANCER, HX OF (ICD-V10.3)hx of 2009 LOW BACK PAIN SYNDROME (ICD-724.2) DIABETES MELLITUS, TYPE II (ICD-250.00) FAMILY HISTORY DIABETES 1ST DEGREE RELATIVE (ICD-V18.0) ANEMIA (ICD-285.9) OBESITY, MORBID (ICD-278.01)  Past  Surgical History: Last updated: 09/19/2009 G 5 P 4; Cath neg 2002 Hysterectomy & BSO 2003 for dysfunctional bleeding, Dr Ambrose Mantle Lumpectomy 2009,Dr Carolynne Edouard, Radiation Tubal ligation Colon polypectomy 2004, Dr Russella Dar Total knee replacement, bilat, Dr Charlann Boxer   Left breast re-excision of inferior margin and replacement of cavity evaluation device with MammoSite.   Family History: Last updated: 11/12/2008 Father: renal failure Mother: CVA,cns aneurysm Siblings: 2 sisters DM; 2 bro DM; 1 bro MI; P aunt breast CA  Social History: Last updated: 11/12/2008 Former Smoker Alcohol use-yes-rare Married Regular exercise-no  Review of Systems       Denies fever, malais, weight loss, blurry vision, decreased visual acuity, cough, sputum, SOB, hemoptysis, pleuritic pain, palpitaitons, heartburn, abdominal pain, melena, lower extremity edema, claudication, or rash. All other systems reviewed and negative  Vital Signs:  Patient profile:   70 year old female Height:      62 inches Weight:      260 pounds BMI:     47.73 Pulse rate:   70 / minute Resp:     18 per minute BP sitting:   136 / 60  (right arm)  Vitals Entered By: Kem Parkinson (December 09, 2009 8:59 AM)  Physical Exam  General:  Affect appropriate Healthy:  appears stated age HEENT: normal Neck supple with no adenopathy JVP normal no bruits no thyromegaly Lungs clear with no wheezing and good diaphragmatic motion Heart:  S1/S2 systolic murmur, no Distal pulses intact with no bruits Plus one  edema emporived Neuro non-focal Skin warm and dry    Impression & Recommendations:  Problem # 1:  AORTIC STENOSIS (ICD-424.1) S/P AVR with residual gradient through 21mm mitral flow pericardial valve.  F/U echo in 6 months EF normal improving clinical status and no AR Her updated medication list for this problem includes:    Lisinopril 40 Mg Tabs (Lisinopril) .Marland Kitchen... 1/2 tab by mouth once daily    Furosemide 40 Mg Tabs  (Furosemide) ..... Qd    Zaroxolyn 2.5 Mg Tabs (Metolazone) ..... One monday wednesday and friday 30 mins before am furosemide  Problem # 2:  HYPERTENSION, ESSENTIAL NOS (ICD-401.9) WEll controlled Her updated medication list for this problem includes:    Lisinopril 40 Mg Tabs (Lisinopril) .Marland Kitchen... 1/2 tab by mouth once daily    Aspirin Ec 325 Mg Tbec (Aspirin) .Marland Kitchen... Take one tablet by mouth daily    Furosemide 40 Mg Tabs (Furosemide) ..... Qd    Zaroxolyn 2.5 Mg Tabs (Metolazone) ..... One monday wednesday and friday 30 mins before am furosemide  Problem # 3:  HYPERLIPIDEMIA (ICD-272.4) AT target with no side effects.  Needs to lower the carbs in her diet Her updated medication list for this problem includes:    Crestor 20 Mg Tabs (Rosuvastatin calcium) .Marland Kitchen... 1 by mouth once daily-office visit and labs due  CHOL: 188 (02/16/2009)   LDL: 121 (02/16/2009)   HDL: 47.60 (02/16/2009)   TG: 96.0 (02/16/2009) CHOL (goal): 200 (11/12/2008)   LDL (goal): 100 (11/12/2008)   HDL (goal): 40 (11/12/2008)   TG (goal): 150 (11/12/2008)

## 2010-12-07 NOTE — Progress Notes (Signed)
Summary: calling re meds  Medications Added KLOR-CON M20 20 MEQ  TBCR (POTASSIUM CHLORIDE CRYS CR) 1 by mouth once daily may take another tab as needed when second furosemide is taken       Phone Note Call from Patient   Caller: Patient Reason for Call: Talk to Nurse Summary of Call: pt calling re medication she needs to take Initial call taken by: Glynda Jaeger,  August 02, 2010 9:01 AM  Follow-up for Phone Call        PT CALLED  RE THE NEED TO GET ADDITIONAL [facility] PHARM[facility]Y TOLD PT CANNNOT GET UNTIL 10/15 PT RUNNING OUT WAS TOLD MAY TAKE SECOND TAB IF SECOND FUROSEMIDE WAS  TAKEN IN ANY GIVEN DAY PER PT HAS BEEN TAKING FUROSEMIDE two times a day AS OF LATELY. Follow-up by: Scherrie Bateman, LPN,  August 02, 2010 11:50 AM  Additional Follow-up for Phone Call Additional follow up Details #1::        Should be able to get more K if taking lasix more Additional Follow-up by: Colon Branch, MD, [facility],  August 02, 2010 10:35 PM    New/Updated Medications: KLOR-CON M20 20 MEQ  TBCR (POTASSIUM CHLORIDE CRYS CR) 1 by mouth once daily may take another tab as needed when second furosemide is taken Prescriptions: KLOR-CON M20 20 MEQ  TBCR (POTASSIUM CHLORIDE CRYS CR) 1 by mouth once daily may take another tab as needed when second furosemide is taken  #180 x 3   Entered by:   Scherrie Bateman, LPN   Authorized by:   Colon Branch, MD, [facility]   Signed by:   Scherrie Bateman, LPN on 62/13/0865   Method used:   Electronically to        [facility] [address] (retail)       [phone]       [place], [place]  [address]       Ph: [phone]       Fax: [phone]   RxID:   [id number]

## 2010-12-07 NOTE — Miscellaneous (Signed)
Summary: [facility] Phys[facility] Order/Treatment Plan  [facility] Order/Treatment Plan   Imported By: Roderic Ovens 12/15/2009 13:25:26  _____________________________________________________________________  External Attachment:    Type:   Image     Comment:   External Document

## 2010-12-07 NOTE — Progress Notes (Signed)
Summary: Triage: Bloodsugar concerns  Phone Note Call from Patient Call back at Home Phone [phone]   Caller: Patient Summary of Call: Message left on voicemail, patient has concerns about her bloodsugars    Shonna Chock CMA  August 04, 2010 10:48 AM   Follow-up for Phone Call        spoke w/ patient says that she wants to make sure she can continue to hold the glipizide concerned about blood sugars since they have been ranging from 140-150.......Marland KitchenDoristine Devoid CMA  August 04, 2010 2:20 PM   hop pls advise  Additional Follow-up for Phone Call Additional follow up Details #1::        OK unless AVERAGE is > 150 in am .Check A1c after 6 weeks  Additional Follow-up by: Marga Melnick MD,  August 04, 2010 4:53 PM    Additional Follow-up for Phone Call Additional follow up Details #2::    Patient aware of Dr.Hopper's response and ok'd patient with pending appointment in November to recheck A1c Follow-up by: Shonna Chock CMA,  August 04, 2010 5:09 PM

## 2010-12-07 NOTE — Assessment & Plan Note (Signed)
Summary: sob/medicare/aarp/mt      Allergies Added:   Referring Provider:  hopper Primary Provider:  Marga Melnick MD  CC:   sob at night.  History of Present Illness: [name] is seen today for F/U of CAD/AVR/HTN and dypsnea.  She had a tiisue AVR in 12/10 by Dr Tyrone Sage.  Echo 4/11 shows mean gradient with no AR and EF 50-55%.  Valve appears hemodynamically small for her.  No preop CAD ( cath 40% LAD).  PA pressure elevated preop 47/14 mmHg/  Has had SOB with cough and URI for a week.  On Zpack per [place].  No ferver.  Needs F/U with him possible steroid taper which will throw her BS off.  Already has chronic advair RX.  No SSCP, palpitaoitns, syncope.  Mild chronic LE edema not worse and no ither signs of CHF  Current Problems (verified): 1)  Constipation  (ICD-564.00) 2)  Personal Hx Colonic Polyps  (ICD-V12.72) 3)  Fecal Occult Blood  (ICD-792.1) 4)  Unspecified Disease of Nail  (ICD-703.9) 5)  Other Nonspecific Abnormal Finding of Lung Field  (ICD-793.19) 6)  Diabetes Mellitus, Uncontrolled  (ICD-250.02) 7)  Rash-nonvesicular  (ICD-782.1) 8)  Aortic Valve Replacement, Hx of  (ICD-V43.3) 9)  Pleurisy  (ICD-511.0) 10)  Other Dyspnea and Respiratory Abnormalities  (ICD-786.09) 11)  Dyspepsia&other Spec Disorders Function Stomach  (ICD-536.8) 12)  Hypertension, Essential Nos  (ICD-401.9) 13)  CHF  (ICD-428.0) 14)  Aortic Stenosis  (ICD-424.1) 15)  Hyperlipidemia  (ICD-272.4) 16)  Carotid Bruit  (ICD-785.9) 17)  Gerd  (ICD-530.81) 18)  Osteoarthritis  (ICD-715.90) 19)  Edema  (ICD-782.3) 20)  Hemoptysis  (ICD-786.3) 21)  Dysmetabolic Syndrome X  (ICD-277.7) 22)  Breast Cancer, Hx of  (ICD-V10.3) 23)  Low Back Pain Syndrome  (ICD-724.2) 24)  Diabetes Mellitus, Type II  (ICD-250.00) 25)  Family History Diabetes 1st Degree Relative  (ICD-V18.0) 26)  Anemia  (ICD-285.9) 27)  Obesity, Morbid  (ICD-278.01)  Current Medications (verified): 1)  Multivitamins  Tabs (Multiple  Vitamin) .Marland Kitchen.. 1 By Mouth Qd 2)  Crestor 20 Mg Tabs (Rosuvastatin Calcium) .Marland Kitchen.. 1 By Mouth Once Daily 3)  Chromium 200 Mcg Tabs (Chromium) .Marland Kitchen.. 1 Tab By Mouth Once Daily 4)  Calcium-Magnesium 500-250 Mg Tabs (Calcium-Magnesium) .Marland Kitchen.. 1 Tab By Mouth Once Daily 5)  Aspirin 81 Mg Tabs (Aspirin) .Marland Kitchen.. 1 By Mouth Once Daily 6)  Furosemide 40 Mg  Tabs (Furosemide) .... 2 Tablets By Mouth Once Daily 7)  Klor-Con M20 20 Meq  Tbcr (Potassium Chloride Crys Cr) .... 2 By Mouth Once Daily 8)  Onetouch Lancets   Misc (Lancets) .... Test Once Daily Dx: 250.00 9)  Onetouch Test   Strp (Glucose Blood) .... Tests One Time A Day Dx:250.00 10)  Losartan Potassium 100 Mg Tabs (Losartan Potassium) .Marland Kitchen.. 1 Once Daily in Place of Lisinopril 11)  Dulera 200-5 Mcg/act Aero (Mometasone Furo-Formoterol Fum) .... 2 Puffs Every 12 Hrs; Gargle & Spit After Use 12)  Tramadol Hcl 50 Mg Tabs (Tramadol Hcl) .Marland Kitchen.. 1 By Mouth Every 6 Hours As Needed 13)  Janumet 50-1000 Mg Tabs (Sitagliptin-Metformin Hcl) .Marland Kitchen.. 1 Two Times A Day With 2 Largest Meals  Allergies (verified): 1)  Septra  Past History:  Past Medical History: Last updated: 11/15/2010 HYPERTENSION, ESSENTIAL NOS (ICD-401.9) CHF (ICD-428.0) AORTIC STENOSIS (ICD-424.1)moderate to severe;  mean grad 43, peak 65, peak velocity 4.03.   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   Family History: Last updated: 11/15/2010 Father: renal failure Mother: CVA,cns aneurysm Siblings: 2 sisters DM; 2 bro DM; 1 bro MI; P aunt breast CA Family History of Colon Cancer: Neice Family History of Colon Polyps:Neice  Social History: Last updated: 11/20/2010 Former Smoker Alcohol use-yes-rare Married Regular exercise-no Daily Caffeine Use  Review of Systems       Denies fever, malais, weight loss, blurry vision, decreased visual acuity, cough, sputum, SOB, hemoptysis, pleuritic pain, palpitaitons, heartburn, abdominal pain, melena, lower extremity edema, claudication, or rash.   Vital Signs:  Patient profile:   70 year old female Height:      62 inches Weight:      271 pounds BMI:     49.75 Pulse rate:   80 / minute Resp:     16 per minute BP sitting:   134 / 78  (left arm)  Vitals Entered By: Kem Parkinson (November 29, 2010 9:26 AM)  Physical Exam  General:  Affect appropriate Healthy:  appears stated age HEENT: normal Neck supple with no adenopathy JVP normal no bruits no thyromegaly Lungs clear with no wheezing and good diaphragmatic motion Heart:  S1/S2 sytolic murmur through AVR no ,rub, gallop or click PMI normal Abdomen: benighn, BS positve, no tenderness, no AAA no bruit.  No HSM or HJR Distal pulses intact with no bruits No edema Neuro non-focal Skin warm and dry Obese   Impression & Recommendations:  Problem # 1:  PERSONAL HX COLONIC POLYPS (ICD-V12.72) Clear to have colonoscopy.  No signs of CHF  Problem # 2:  AORTIC VALVE REPLACEMENT, HX OF (ICD-V43.3) No change in exam  no AR.  Continue SBE prophylaxis  Problem # 3:  DIABETES MELLITUS, UNCONTROLLED (ICD-250.02) Some weight gain from janumet but stable.  AIc quarterly.   Her updated  medication list for this problem includes:    Aspirin 81 Mg Tabs (Aspirin) .Marland Kitchen... 1 by mouth once daily    Losartan Potassium 100 Mg Tabs (Losartan potassium) .Marland Kitchen... 1 once daily in place of lisinopril    Janumet 50-1000 Mg Tabs (Sitagliptin-metformin hcl) .Marland Kitchen... 1 two times a day with 2 largest meals  Problem # 4:  HYPERTENSION, ESSENTIAL NOS (ICD-401.9) Well controlled Her updated medication list for this problem includes:    Aspirin 81 Mg Tabs (Aspirin) .Marland Kitchen... 1 by mouth once daily    Furosemide 40 Mg Tabs (Furosemide) .Marland Kitchen... 2 tablets by mouth once daily    Losartan Potassium 100 Mg Tabs (Losartan potassium) .Marland Kitchen... 1 once daily in place of lisinopril  Problem # 5:  HYPERLIPIDEMIA (ICD-272.4) At goal with no CAD  No side effects Labs per Dr Alwyn Ren Her updated medication list for this problem includes:    Crestor 20 Mg Tabs (Rosuvastatin calcium) .Marland Kitchen... 1 by mouth once daily  CHOL: 174 (12/21/2009)   LDL: 103 (12/21/2009)   HDL: 47.60 (12/21/2009)   TG: 118.0 (12/21/2009) CHOL (goal): 200 (11/12/2008)   LDL (goal): 100 (11/12/2008)   HDL (  goal): 40 (11/12/2008)   TG (goal): 150 (11/12/2008)  Patient Instructions: 1)  Your physician recommends that you schedule a follow-up appointment in: 6 MONTHS WITH DR Eden Emms 2)  Your physician recommends that you continue on your current medications as directed. Please refer to the Current Medication list given to you today.

## 2010-12-07 NOTE — Consult Note (Signed)
Summary: [facility]   Imported By: Roderic Ovens 11/08/2009 11:00:43  _____________________________________________________________________  External Attachment:    Type:   Image     Comment:   External Document

## 2010-12-07 NOTE — Assessment & Plan Note (Signed)
Summary: 6 MO F/U  Medications Added KLOR-CON M20 20 MEQ  TBCR (POTASSIUM CHLORIDE CRYS CR) 2 by mouth once daily      Allergies Added:   Referring Provider:  hopper Primary Provider:  Marga Melnick MD   History of Present Illness: [name] is seen today for F/U of CAD/AVR/HTN and dypsnea.  She had a tiisue AVR in 12/10 by Dr Tyrone Sage.  Echo 4/11 shows mean gradient with no AR and EF 50-55%.  Valve appears hemodynamically small for her.  No preop CAD ( cath 40% LAD).  PA pressure elevated preop 47/14 mmHg/  Has had SOB with cough and URI for a week.  On Zpack per [place].  No ferver.  Needs F/U with him possible steroid taper which will throw her BS off.  Already has chronic advair RX.  No SSCP, palpitaoitns, syncope.  Mild chronic LE edema not worse and no ither signs of CHF  Current Problems (verified): 1)  Bronchitis-acute  (ICD-466.0) 2)  Diabetes Mellitus, Controlled  (ICD-250.00) 3)  Rash-nonvesicular  (ICD-782.1) 4)  Aortic Valve Replacement, Hx of  (ICD-V43.3) 5)  Pleurisy  (ICD-511.0) 6)  Other Dyspnea and Respiratory Abnormalities  (ICD-786.09) 7)  Dyspepsia&other Spec Disorders Function Stomach  (ICD-536.8) 8)  Hypertension, Essential Nos  (ICD-401.9) 9)  CHF  (ICD-428.0) 10)  Aortic Stenosis  (ICD-424.1) 11)  Hyperlipidemia  (ICD-272.4) 12)  Carotid Bruit  (ICD-785.9) 13)  Gerd  (ICD-530.81) 14)  Osteoarthritis  (ICD-715.90) 15)  Edema  (ICD-782.3) 16)  Hemoptysis  (ICD-786.3) 17)  Dysmetabolic Syndrome X  (ICD-277.7) 18)  Breast Cancer, Hx of  (ICD-V10.3) 19)  Low Back Pain Syndrome  (ICD-724.2) 20)  Diabetes Mellitus, Type II  (ICD-250.00) 21)  Family History Diabetes 1st Degree Relative  (ICD-V18.0) 22)  Anemia  (ICD-285.9) 23)  Obesity, Morbid  (ICD-278.01)  Current Medications (verified): 1)  Multivitamins  Tabs (Multiple Vitamin) .Marland Kitchen.. 1 By Mouth Qd 2)  Lisinopril 40 Mg Tabs (Lisinopril) .Marland Kitchen.. 1 By Mouth Once Daily -Per Patient Cardiology Changed 3)   Crestor 20 Mg Tabs (Rosuvastatin Calcium) .Marland Kitchen.. 1 By Mouth Once Daily 4)  Metformin Hcl 1000 Mg Tabs (Metformin Hcl) .Marland Kitchen.. 1 By Mouth Two Times A Day 5)  Chromium 200 Mcg Tabs (Chromium) .Marland Kitchen.. 1 Tab By Mouth Once Daily 6)  Calcium-Magnesium 500-250 Mg Tabs (Calcium-Magnesium) .Marland Kitchen.. 1 Tab By Mouth Once Daily 7)  Aspirin 81 Mg Tabs (Aspirin) .Marland Kitchen.. 1 By Mouth Once Daily 8)  Furosemide 40 Mg  Tabs (Furosemide) .... 2by Mouth Once Daily 9)  Klor-Con M20 20 Meq  Tbcr (Potassium Chloride Crys Cr) .... 2 By Mouth Once Daily 10)  Onetouch Lancets   Misc (Lancets) .... Test Once Daily Dx: 250.00 11)  Onetouch Test   Strp (Glucose Blood) .... Tests One Time A Day Dx:250.00 12)  Advair Diskus 100-50 Mcg/dose Misc (Fluticasone-Salmeterol) .Marland Kitchen.. 1 Inhalation Every 12 Hrs ; Gargle & Swallow After Use 13)  Nu-Iron 150 Mg Caps (Polysaccharide Iron Complex) .Marland Kitchen.. 1 By Mouth Once Daily  Allergies (verified): 1)  Septra  Past History:  Past Medical History: Last updated: 03/30/2010 HYPERTENSION, ESSENTIAL NOS (ICD-401.9) CHF (ICD-428.0) AORTIC STENOSIS (ICD-424.1)moderate to severe;  mean grad 43, peak 65, peak velocity 4.03.   HYPERLIPIDEMIA (ICD-272.4) CAROTID BRUIT (ICD-785.9) GERD (ICD-530.81) OSTEOARTHRITIS (ICD-715.90) EDEMA (ICD-782.3) HEMOPTYSIS (ICD-786.3) DYSMETABOLIC SYNDROME X (ICD-277.7) BREAST CANCER, HX OF (ICD-V10.3)hx of 2009 LOW BACK PAIN SYNDROME (ICD-724.2) DIABETES MELLITUS, TYPE II (ICD-250.00) FAMILY HISTORY DIABETES 1ST DEGREE RELATIVE (ICD-V18.0) ANEMIA (ICD-285.9) OBESITY, MORBID (ICD-278.01)  Past Surgical History: Last updated: 03/30/2010 G 5 P 4; Cath neg 2002 Hysterectomy & BSO 2003 for dysfunctional bleeding, Dr Ambrose Mantle Lumpectomy 2009,Dr Carolynne Edouard, Radiation Tubal ligation Colon polypectomy 2004, Dr Russella Dar Total knee replacement, bilat, Dr Charlann Boxer Aortic Ramond Marrow replacement   Left breast re-excision of inferior margin and replacement of cavity evaluation device with MammoSite.    Family History: Last updated: 11/12/2008 Father: renal failure Mother: CVA,cns aneurysm Siblings: 2 sisters DM; 2 bro DM; 1 bro MI; P aunt breast CA  Social History: Last updated: 11/12/2008 Former Smoker Alcohol use-yes-rare Married Regular exercise-no  Review of Systems       Denies fever, malais, weight loss, blurry vision, decreased visual acuity, sputum, hemoptysis, pleuritic pain, palpitaitons, heartburn, abdominal pain, melena, lower extremity edema, claudication, or rash.   Vital Signs:  Patient profile:   70 year old female Height:      62 inches Weight:      267 pounds BMI:     49.01 Pulse rate:   70 / minute Resp:     16 per minute BP sitting:   110 / 60  (left arm)  Vitals Entered By: Kem Parkinson (August 24, 2010 9:58 AM)  Physical Exam  General:  Affect appropriate Healthy:  appears stated age HEENT: normal Neck supple with no adenopathy JVP normal no bruits no thyromegaly Lungs  wheezy and rhonchi throughout Heart:  S1/S2 AS murmur through tissue valve with no AR  murmur, n orub, gallop or click PMI normal Abdomen: benighn, BS positve, no tenderness, no AAA no bruit.  No HSM or HJR Distal pulses intact with no bruits Trace bilateral edema Neuro non-focal Skin warm and dry    Impression & Recommendations:  Problem # 1:  BRONCHITIS-ACUTE (ICD-466.0) F/U with Hopper next week.  Continue Zpack and symptomatic Rx.  No signs of CHF but will check lab work The following medications were removed from the medication list:    Azithromycin 250 Mg Tabs (Azithromycin) .Marland Kitchen... As per pack Her updated medication list for this problem includes:    Advair Diskus 100-50 Mcg/dose Misc (Fluticasone-salmeterol) .Marland Kitchen... 1 inhalation every 12 hrs ; gargle & swallow after use  Problem # 2:  AORTIC VALVE REPLACEMENT, HX OF (ICD-V43.3) No change in exam.  Will need echo if fever develops.  f/u echo 4/12  Problem # 3:  HYPERTENSION, ESSENTIAL NOS  (ICD-401.9) Well controlled Her updated medication list for this problem includes:    Lisinopril 40 Mg Tabs (Lisinopril) .Marland Kitchen... 1 by mouth once daily -per patient cardiology changed    Aspirin 81 Mg Tabs (Aspirin) .Marland Kitchen... 1 by mouth once daily    Furosemide 40 Mg Tabs (Furosemide) .Marland Kitchen... 2by mouth once daily  Orders: TLB-BMP (Basic Metabolic Panel-BMET) (80048-METABOL)  Problem # 4:  EDEMA (ICD-782.3) Stable continue current lasix dose Check BMET and BNP  Other Orders: TLB-BNP (B-Natriuretic Peptide) (83880-BNPR) TLB-CBC Platelet - w/Differential (85025-CBCD)  Patient Instructions: 1)  Your physician recommends that you schedule a follow-up appointment in: 6 MONTHS 2)  You have been referred to NEEDS F/U WITH DR HOPPER FOR URI

## 2010-12-07 NOTE — Assessment & Plan Note (Signed)
Summary: 2 M0NTH ROV/SL  Medications Added FUROSEMIDE 40 MG  TABS (FUROSEMIDE) 1 by mouth once daily KLOR-CON M20 20 MEQ  TBCR (POTASSIUM CHLORIDE CRYS CR) 1 by mouth once daily        Visit Type:  2 mo f/u Referring Provider:  hopper Primary Provider:  Marga Melnick MD  CC:  no cardiac complaints today.  History of Present Illness: [name] is seen today post tissue AVR.  She had elevated gradient on echo 2/11 mena and peak with no significant AR.  I suspect that this is mosty a relatively small valve.  She did not want a mechanical valve as she didn't want to take coumadin.  Her edema is imporved.  She is stopping her zaroxyln and only taking Lasix once daily.  Her BP is under good control.  She is going to cardiac rehab 3x/week and to [facility] to use the elliptical  Current Problems (verified): 1)  Pleurisy  (ICD-511.0) 2)  Other Dyspnea and Respiratory Abnormalities  (ICD-786.09) 3)  Dyspepsia&other Spec Disorders Function Stomach  (ICD-536.8) 4)  Hypertension, Essential Nos  (ICD-401.9) 5)  CHF  (ICD-428.0) 6)  Aortic Stenosis  (ICD-424.1) 7)  Hyperlipidemia  (ICD-272.4) 8)  Carotid Bruit  (ICD-785.9) 9)  Gerd  (ICD-530.81) 10)  Osteoarthritis  (ICD-715.90) 11)  Edema  (ICD-782.3) 12)  Hemoptysis  (ICD-786.3) 13)  Dysmetabolic Syndrome X  (ICD-277.7) 14)  Breast Cancer, Hx of  (ICD-V10.3) 15)  Low Back Pain Syndrome  (ICD-724.2) 16)  Diabetes Mellitus, Type II  (ICD-250.00) 17)  Family History Diabetes 1st Degree Relative  (ICD-V18.0) 18)  Anemia  (ICD-285.9) 19)  Obesity, Morbid  (ICD-278.01)  Current Medications (verified): 1)  Multivitamins  Tabs (Multiple Vitamin) .Marland Kitchen.. 1 By Mouth Qd 2)  Lisinopril 20 Mg Tabs (Lisinopril) .Marland Kitchen.. 1 By Mouth Once Daily 3)  Crestor 20 Mg Tabs (Rosuvastatin Calcium) .Marland Kitchen.. 1 By Mouth Once Daily-Office Visit and Labs Due 4)  Glipizide Xl 5 Mg Tb24 (Glipizide) .Marland Kitchen.. 1 By Mouth Qd 5)  Metformin Hcl 1000 Mg Tabs (Metformin Hcl) .Marland Kitchen..  1 By Mouth Two Times A Day-Office Visit and Labs Due 6)  Chromium 200 Mcg Tabs (Chromium) .Marland Kitchen.. 1 Tab By Mouth Once Daily 7)  Calcium-Magnesium 500-250 Mg Tabs (Calcium-Magnesium) .Marland Kitchen.. 1 Tab By Mouth Once Daily 8)  Aspirin Ec 325 Mg Tbec (Aspirin) .... Take One Tablet By Mouth Daily 9)  Furosemide 40 Mg  Tabs (Furosemide) .Marland Kitchen.. 1 By Mouth Once Daily 10)  Klor-Con M20 20 Meq  Tbcr (Potassium Chloride Crys Cr) .Marland Kitchen.. 1 By Mouth Once Daily 11)  Onetouch Lancets   Misc (Lancets) .... Test Once Daily 12)  Onetouch Test   Strp (Glucose Blood) .... Tests One Time A Day 13)  Advair Diskus 100-50 Mcg/dose Misc (Fluticasone-Salmeterol) .Marland Kitchen.. 1 Inhalation Every 12 Hrs ; Gargle & Swallow After Use 14)  Zaroxolyn 2.5 Mg Tabs (Metolazone) .... One Monday Wednesday and Friday 30 Mins Before Am Furosemide 15)  Hydrocodone-Acetaminophen 5-500 Mg Tabs (Hydrocodone-Acetaminophen) .... As Needed 16)  Hycodan .Marland Kitchen.. 1-2 Tsp Q 6 Hours As Needed Cough 17)  Nu-Iron 150 Mg Caps (Polysaccharide Iron Complex) .Marland Kitchen.. 1 By Mouth Once Daily 18)  Doxycycline Hyclate 100 Mg Caps (Doxycycline Hyclate) .Marland Kitchen.. 1 Two Times A Day X 2 Days Then 1 Once Daily 19)  Tramadol Hcl 50 Mg Tabs (Tramadol Hcl) .Marland Kitchen.. 1 Q 6 Hrs As Needed For  Pain 20)  Desoximetasone 0.05 % Crea (Desoximetasone) .... Apply A Thin Fil Locally 2  Times A Day As Needed  Allergies: 1)  Septra  Past History:  Past Medical History: Last updated: 09/19/2009 Current Problems:  HYPERTENSION, ESSENTIAL NOS (ICD-401.9) CHF (ICD-428.0) AORTIC STENOSIS (ICD-424.1)moderate to severe;  mean grad 43, peak 65, peak velocity 4.03.  Not ideal candidate for surgery due to weight and recent diagnosis of breast cancer HYPERLIPIDEMIA (ICD-272.4) CAROTID BRUIT (ICD-785.9) GERD (ICD-530.81) OSTEOARTHRITIS (ICD-715.90) EDEMA (ICD-782.3) HEMOPTYSIS (ICD-786.3) DYSMETABOLIC SYNDROME X (ICD-277.7) BREAST CANCER, HX OF (ICD-V10.3)hx of 2009 LOW BACK PAIN SYNDROME (ICD-724.2) DIABETES  MELLITUS, TYPE II (ICD-250.00) FAMILY HISTORY DIABETES 1ST DEGREE RELATIVE (ICD-V18.0) ANEMIA (ICD-285.9) OBESITY, MORBID (ICD-278.01)  Past Surgical History: Last updated: 09/19/2009 G 5 P 4; Cath neg 2002 Hysterectomy & BSO 2003 for dysfunctional bleeding, Dr Ambrose Mantle Lumpectomy 2009,Dr Carolynne Edouard, Radiation Tubal ligation Colon polypectomy 2004, Dr Russella Dar Total knee replacement, bilat, Dr Charlann Boxer   Left breast re-excision of inferior margin and replacement of cavity evaluation device with MammoSite.   Family History: Last updated: 11/12/2008 Father: renal failure Mother: CVA,cns aneurysm Siblings: 2 sisters DM; 2 bro DM; 1 bro MI; P aunt breast CA  Social History: Last updated: 11/12/2008 Former Smoker Alcohol use-yes-rare Married Regular exercise-no  Review of Systems       Denies fever, malais, weight loss, blurry vision, decreased visual acuity, cough, sputum, SOB, hemoptysis, pleuritic pain, palpitaitons, heartburn, abdominal pain, melena, lower extremity edema, claudication, or rash.   Vital Signs:  Patient profile:   70 year old female Height:      62 inches Weight:      259 pounds BMI:     47.54 Pulse rate:   68 / minute Pulse rhythm:   regular BP sitting:   122 / 80  (left arm) Cuff size:   large  Vitals Entered By: Danielle Rankin, CMA (February 09, 2010 10:20 AM)  Physical Exam  General:  Affect appropriate Healthy:  appears stated age HEENT: normal Neck supple with no adenopathy JVP normal no bruits no thyromegaly Lungs clear with no wheezing and good diaphragmatic motion Heart:  S1/S2 systolic murmur,rub, gallop or click PMI normal Abdomen: benighn, BS positve, no tenderness, no AAA no bruit.  No HSM or HJR Distal pulses intact with no bruits No edema Neuro non-focal Skin warm and dry    Impression & Recommendations:  Problem # 1:  HYPERTENSION, ESSENTIAL NOS (ICD-401.9) Well controlled Her updated medication list for this problem includes:     Lisinopril 20 Mg Tabs (Lisinopril) .Marland Kitchen... 1 by mouth once daily    Aspirin Ec 325 Mg Tbec (Aspirin) .Marland Kitchen... Take one tablet by mouth daily    Furosemide 40 Mg Tabs (Furosemide) .Marland Kitchen... 1 by mouth once daily    Zaroxolyn 2.5 Mg Tabs (Metolazone) ..... 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  F/U echo Her updated medication list for this problem includes:    Lisinopril 20 Mg Tabs (Lisinopril) .Marland Kitchen... 1 by mouth once daily    Furosemide 40 Mg Tabs (Furosemide) .Marland Kitchen... 1 by mouth once daily    Zaroxolyn 2.5 Mg Tabs (Metolazone) ..... One monday wednesday and friday 30 mins before am furosemide  Orders: Echocardiogram (Echo)  Problem # 3:  HYPERLIPIDEMIA (ICD-272.4) At target with statin Her updated medication list for this problem includes:    Crestor 20 Mg Tabs (Rosuvastatin calcium) .Marland Kitchen... 1 by mouth once daily-office visit and labs due  CHOL: 174 (12/21/2009)  LDL: 103 (12/21/2009)   HDL: 47.60 (12/21/2009)   TG: 118.0 (12/21/2009) CHOL (goal): 200 (11/12/2008)   LDL (goal): 100 (11/12/2008)   HDL (goal): 40 (11/12/2008)   TG (goal): 150 (11/12/2008)  Problem # 4:  EDEMA (ICD-782.3) Improved continue maint. daily Lasix with K suppl.  Resume zaroxyln only if weight up or edema worsens  Patient Instructions: 1)  Your physician recommends that you schedule a follow-up appointment in: 6 MONTHS 2)  Your physician has requested that you have an echocardiogram.  Echocardiography is a painless test that uses sound waves to create images of your heart. It provides your doctor with information about the size and shape of your heart and how well your heart's chambers and valves are working.  This procedure takes approximately one hour. There are no restrictions for this procedure.

## 2010-12-07 NOTE — Procedures (Signed)
Summary: Flexible Sigmoidoscopy/[facility]  Flexible Sigmoidoscopy/[facility]   Imported By: Sherian Rein 11/20/2010 14:48:35  _____________________________________________________________________  External Attachment:    Type:   Image     Comment:   External Document

## 2010-12-07 NOTE — Assessment & Plan Note (Signed)
Summary: COUGHING/RH.....   Vital Signs:  Patient profile:   70 year old female Weight:      269.8 pounds BMI:     49.53 O2 Sat:      97 % Temp:     98.3 degrees F oral Pulse rate:   75 / minute Resp:     16 per minute BP sitting:   126 / 82  (left arm) Cuff size:   large  Vitals Entered By: Shonna Chock CMA (August 21, 2010 2:05 PM) CC: Onset: Friday patient c/o of productive cough-yellow, wheezing, body aches , Cough   Primary Care Provider:  Marga Melnick MD  CC:  Onset: Friday patient c/o of productive cough-yellow, wheezing, body aches , and Cough.  History of Present Illness: Cough      This is a 70 year old woman who presents with Cough for > 2 days.  The patient reports productive cough with yellow sputum, some shortness of breath  and wheezing.? PMH of asthma.She denies pleuritic chest pain and hemoptysis.  The patient denies the following symptoms: cold/URI symptoms, sore throat, and nasal congestion.  The cough is worse with lying down.  Ineffective prior treatments have included Rx cough medication of her husband's.  FBS < 150 off Glipizide.  Current Medications (verified): 1)  Multivitamins  Tabs (Multiple Vitamin) .Marland Kitchen.. 1 By Mouth Qd 2)  Lisinopril 40 Mg Tabs (Lisinopril) .Marland Kitchen.. 1 By Mouth Once Daily -Per Patient Cardiology Changed 3)  Crestor 20 Mg Tabs (Rosuvastatin Calcium) .Marland Kitchen.. 1 By Mouth Once Daily 4)  Metformin Hcl 1000 Mg Tabs (Metformin Hcl) .Marland Kitchen.. 1 By Mouth Two Times A Day 5)  Chromium 200 Mcg Tabs (Chromium) .Marland Kitchen.. 1 Tab By Mouth Once Daily 6)  Calcium-Magnesium 500-250 Mg Tabs (Calcium-Magnesium) .Marland Kitchen.. 1 Tab By Mouth Once Daily 7)  Aspirin 81 Mg Tabs (Aspirin) .Marland Kitchen.. 1 By Mouth Once Daily 8)  Furosemide 40 Mg  Tabs (Furosemide) .... 2by Mouth Once Daily 9)  Klor-Con M20 20 Meq  Tbcr (Potassium Chloride Crys Cr) .Marland Kitchen.. 1-2 By Mouth Once Daily 10)  Onetouch Lancets   Misc (Lancets) .... Test Once Daily Dx: 250.00 11)  Onetouch Test   Strp (Glucose Blood) .... Tests  One Time A Day Dx:250.00 12)  Advair Diskus 100-50 Mcg/dose Misc (Fluticasone-Salmeterol) .Marland Kitchen.. 1 Inhalation Every 12 Hrs ; Gargle & Swallow After Use 13)  Nu-Iron 150 Mg Caps (Polysaccharide Iron Complex) .Marland Kitchen.. 1 By Mouth Once Daily  Allergies: 1)  Septra  Physical Exam  General:  in no acute distress; alert,appropriate and cooperative throughout examination Lungs:  Normal respiratory effort, chest expands symmetrically. Lungs : mild  crackles , rhonchi, wheezes. Heart:  normal rate, regular rhythm, no gallop, no rub, no JVD, and grade 1 /6 systolic murmur.   Extremities:  No clubbing, cyanosis, edema. Cervical Nodes:  No lymphadenopathy noted Axillary Nodes:  No palpable lymphadenopathy   Impression & Recommendations:  Problem # 1:  BRONCHITIS-ACUTE (ICD-466.0)  RAD component The following medications were removed from the medication list:    Doxycycline Hyclate 100 Mg Caps (Doxycycline hyclate) .Marland Kitchen... 1 two times a day x 5 days then once daily ; avoid sun Her updated medication list for this problem includes:    Advair Diskus 100-50 Mcg/dose Misc (Fluticasone-salmeterol) .Marland Kitchen... 1 inhalation every 12 hrs ; gargle & swallow after use    Azithromycin 250 Mg Tabs (Azithromycin) .Marland Kitchen... As per pack  Orders: Prescription Created Electronically [phone])  Complete Medication List: 1)  Multivitamins Tabs (Multiple vitamin) .Marland KitchenMarland KitchenMarland Kitchen  1 by mouth qd 2)  Lisinopril 40 Mg Tabs (Lisinopril) .Marland Kitchen.. 1 by mouth once daily -per patient cardiology changed 3)  Crestor 20 Mg Tabs (Rosuvastatin calcium) .Marland Kitchen.. 1 by mouth once daily 4)  Metformin Hcl 1000 Mg Tabs (Metformin hcl) .Marland Kitchen.. 1 by mouth two times a day 5)  Chromium 200 Mcg Tabs (Chromium) .Marland Kitchen.. 1 tab by mouth once daily 6)  Calcium-magnesium 500-250 Mg Tabs (Calcium-magnesium) .Marland Kitchen.. 1 tab by mouth once daily 7)  Aspirin 81 Mg Tabs (Aspirin) .Marland Kitchen.. 1 by mouth once daily 8)  Furosemide 40 Mg Tabs (Furosemide) .... 2by mouth once daily 9)  Klor-con M20 20 Meq  Tbcr (Potassium chloride crys cr) .Marland Kitchen.. 1-2 by mouth once daily 10)  Onetouch Lancets Misc (Lancets) .... Test once daily dx: 250.00 11)  Onetouch Test Strp (Glucose blood) .... Tests one time a day dx:250.00 12)  Advair Diskus 100-50 Mcg/dose Misc (Fluticasone-salmeterol) .Marland Kitchen.. 1 inhalation every 12 hrs ; gargle & swallow after use 13)  Nu-iron 150 Mg Caps (Polysaccharide iron complex) .Marland Kitchen.. 1 by mouth once daily 14)  Azithromycin 250 Mg Tabs (Azithromycin) .... As per pack  Patient Instructions: 1)  Drink as much fluid as you can tolerate for the next few days. Use Advair samples ( 250/50 week #1 & 100/50 week #2) as directed. Prescriptions: AZITHROMYCIN 250 MG TABS (AZITHROMYCIN) as per pack  #1 x 0   Entered and Authorized by:   Marga Melnick MD   Signed by:   Marga Melnick MD on 08/21/2010   Method used:   Faxed to ...       [facility] [address] (retail)       [phone]       [place], [place]  [address]       Ph: [phone]       Fax: [phone]   RxID:   [id number]    Orders Added: 1)  Prescription Created Electronically [[id number]] 2)  Est. Patient Level III [24401]

## 2010-12-07 NOTE — Procedures (Signed)
Summary: Colonoscopy   Colonoscopy  Procedure date:  06/30/2003  Findings:      Pathology:  Adenomatous polyp.        Location:  [facility].    Procedures Next Due Date:    Colonoscopy: 07/2008 Patient Name: [name], [name] MRN:  Procedure Procedures: Colonoscopy CPT: 04540.    with Hot Biopsy(s)CPT: [id number].  Personnel: Endoscopist: Venita Lick. Russella Dar, MD, Clementeen Graham.  Exam Location: Exam performed in Outpatient Clinic. Outpatient  Patient Consent: Procedure, Alternatives, Risks and Benefits discussed, consent obtained, from patient. Consent was obtained by the RN.  Indications Symptoms: Constipation  Surveillance of: Adenomatous Polyp(s). Initial polypectomy was performed in 2000. in Oct.  History  Pre-Exam Physical: Performed Jun 30, 2003. Cardio-pulmonary exam WNL. Rectal exam abnormal. HEENT exam , Abdominal exam, Extremity exam, Neurological exam, Mental status exam WNL. Abnormal PE findings include: ext. hem. tags.  Exam Exam: Extent of exam reached: Cecum, extent intended: Cecum.  The cecum was identified by appendiceal orifice and IC valve. Colon retroflexion performed. ASA Classification: II. Tolerance: good.  Monitoring: Pulse and BP monitoring, Oximetry used. Supplemental O2 given.  Colon Prep Used Golytely for colon prep. Prep results: good.  Sedation Meds: Patient assessed and found to be appropriate for moderate (conscious) sedation. Fentanyl 75 mcg. given IV. Versed 5 mg. given IV.  Findings POLYP: Cecum, Maximum size: 3 mm. sessile polyp. Procedure:  hot biopsy, removed, retrieved, Polyp sent to pathology. ICD9: Colon Polyps: 211.3.  NORMAL EXAM: Ascending Colon to Rectum.  POLYP: Cecum, Maximum size: 6 mm. sessile polyp. Procedure:  hot biopsy, removed, retrieved, sent to pathology. ICD9: Colon Polyps: 211.3.   Assessment  Diagnoses: 211.3: Colon Polyps.   Events  Unplanned Interventions: No intervention was required.    Unplanned Events: There were no complications. Plans  Post Exam Instructions: No aspirin or non-steroidal containing medications: 2 weeks.  Medication Plan: Await pathology. Continue current medications.  Patient Education: Patient given standard instructions for: Polyps. Constipation. Disposition: After procedure patient sent to recovery. After recovery patient sent home.  Scheduling/Referral: Colonoscopy, to [facility] T. Russella Dar, MD, [facility], around Jun 29, 2008.  Primary Care Provider, to Titus Dubin. Alwyn Ren, MD,    This report was created from the original endoscopy report, which was reviewed and signed by the above listed endoscopist.    cc: Titus Dubin. Alwyn Ren, MD

## 2010-12-07 NOTE — Miscellaneous (Signed)
Summary: [facility] Cardiac Progress Note  [facility] Cardiac Progress Note   Imported By: Roderic Ovens 06/26/2010 15:49:28  _____________________________________________________________________  External Attachment:    Type:   Image     Comment:   External Document

## 2010-12-07 NOTE — Progress Notes (Signed)
Summary: SOB   Phone Note Other Incoming Call back at [phone]   Caller: [facility] Summary of Call: pt still having SOB when walking and going to bed, swelling in legs has gone done, elevated BP, weigth is stable Initial call taken by: Migdalia Dk,  November 30, 2009 1:49 PM  Follow-up for Phone Call        spoke with Olene Craven, the pt seems to be more SOB than what is expected after surgery. pt bp is running 140 to 150 and they are having her track her bp. the edema in her legs is down, her weight is stable, no cough, no wheezing. the pt does report not being able to breath when lying flat and has been sleeping in the recliner. the home health nurse reports the pt is only able to walk about 34ft and back before she is SOB. she has a follow up with dr gerhardt and dr Eden Emms next week. she has restarted her advair disc for her asthma but that has not helped. will discuss with dr Eden Emms . Follow-up by: Deliah Goody, RN,  November 30, 2009 3:00 PM  Additional Follow-up for Phone Call Additional follow up Details #1::        discussed with dr Eden Emms, pt to come by the office tomorrow for a chest xray. she will also have an echo on 12-09-09 prior to her appt with dr Eden Emms. pt voiced understanding. Deliah Goody, RN  November 30, 2009 5:15 PM      Appended Document: SOB PT AWARE TO INCREASE FUROSEMIDE 20 MG TWICE A DAY AND TO START  ZAROXLYN 2.5 MG TO TAKE ON M/W/F.  SHE IS ALSO AWARE TO KEEP APPT ON THURSDAY WITH DR [id number] AND HAVE REPEAT CXR.  DR [id number] IS AWARE, HE HAS REVIEWED THE CXR AND DOES NOT FEEL LIKE THERE IS ENOUGH FLUID TO TAP AT THIS POINT.  RX SEND TO [facility] [address].

## 2010-12-07 NOTE — Progress Notes (Signed)
Summary: ? about pt advair  Phone Note From Other Clinic   Caller: kristin from [facility]  [phone] Reason for Call: Medication Check Summary of Call: has questions about pt rx Advair is she on if so what is the dose and directions.  Called and left msg pt is on and dose is 100-50 1 inhalation every 12 hours .Kandice Hams  November 24, 2009 4:15 PM  Initial call taken by: Kandice Hams,  November 24, 2009 4:15 PM

## 2010-12-07 NOTE — Progress Notes (Signed)
Summary: Cardiology Phone Note - cough/congestion   Phone Note Call from Patient   Caller: Daughter Summary of Call: Received call from Ms. January's dtr reporting that [name] has been surrounded by grandchildren with resp. infxns and today has developed cough and congestion.  She plans to take her mother to urgent care for eval and wanted to make sure that - that was the right thing to do.  I advised that - that was a good plan given her recent AVR and relative lack of activity, that she would benefit from a cxr to r/o pna.   Initial call taken by: Creig Hines, ANP-BC,  December 03, 2009 10:00 PM

## 2010-12-07 NOTE — Assessment & Plan Note (Signed)
Summary: BOTH LEGS ITCHY//PH   Vital Signs:  Patient profile:   70 year old female Weight:      265.6 pounds Temp:     97.6 degrees F oral Pulse rate:   60 / minute Resp:     17 per minute BP sitting:   120 / 64  (left arm) Cuff size:   large  Vitals Entered By: Shonna Chock CMA (June 12, 2010 12:04 PM) CC: Rash on legs x 10 days   Primary Care Provider:  Marga Melnick MD  CC:  Rash on legs x 10 days.  History of Present Illness: Itching  of feet  X 10 days over feet; rash started 3 days afterwards. She used cortisone 2-3 x , Neopsporin X 2 &  Lanacaine 2X. She questioned impetigo; her children & grandchildren have had this remotely. No exposure to or PMH of MRSA. New shoes as of 2 weeks ago.  Current Medications (verified): 1)  Multivitamins  Tabs (Multiple Vitamin) .Marland Kitchen.. 1 By Mouth Qd 2)  Lisinopril 40 Mg Tabs (Lisinopril) .Marland Kitchen.. 1 By Mouth Once Daily -Per Patient Cardiology Changed 3)  Crestor 20 Mg Tabs (Rosuvastatin Calcium) .Marland Kitchen.. 1 By Mouth Once Daily 4)  Glipizide Xl 5 Mg Tb24 (Glipizide) .Marland Kitchen.. 1 By Mouth Qd 5)  Metformin Hcl 1000 Mg Tabs (Metformin Hcl) .Marland Kitchen.. 1 By Mouth Two Times A Day 6)  Chromium 200 Mcg Tabs (Chromium) .Marland Kitchen.. 1 Tab By Mouth Once Daily 7)  Calcium-Magnesium 500-250 Mg Tabs (Calcium-Magnesium) .Marland Kitchen.. 1 Tab By Mouth Once Daily 8)  Aspirin 81 Mg Tabs (Aspirin) .Marland Kitchen.. 1 By Mouth Once Daily 9)  Furosemide 40 Mg  Tabs (Furosemide) .Marland Kitchen.. 1 By Mouth Once Daily 10)  Klor-Con M20 20 Meq  Tbcr (Potassium Chloride Crys Cr) .Marland Kitchen.. 1 By Mouth Once Daily 11)  Onetouch Lancets   Misc (Lancets) .... Test Once Daily Dx: 250.00 12)  Onetouch Test   Strp (Glucose Blood) .... Tests One Time A Day Dx:250.00 13)  Advair Diskus 100-50 Mcg/dose Misc (Fluticasone-Salmeterol) .Marland Kitchen.. 1 Inhalation Every 12 Hrs ; Gargle & Swallow After Use 14)  Nu-Iron 150 Mg Caps (Polysaccharide Iron Complex) .Marland Kitchen.. 1 By Mouth Once Daily  Allergies: 1)  Septra  Physical Exam  General:  in no acute  distress; alert,appropriate and cooperative throughout examination Pulses:  R and L dorsalis pedis and posterior tibial pulses are full and equal bilaterally Extremities:  No clubbing, cyanosis, edema.   Skin:  papules of legs & feet with excoriations  from ankles to dorsum of feet   Impression & Recommendations:  Problem # 1:  RASH-NONVESICULAR (ICD-782.1)  Orders: Prescription Created Electronically [phone])  Problem # 2:  DIABETES MELLITUS, CONTROLLED (ICD-250.00)  The following medications were removed from the medication list:    Glipizide Xl 5 Mg Tb24 (Glipizide) .Marland Kitchen... 1 by mouth qd Her updated medication list for this problem includes:    Lisinopril 40 Mg Tabs (Lisinopril) .Marland Kitchen... 1 by mouth once daily -per patient cardiology changed    Metformin Hcl 1000 Mg Tabs (Metformin hcl) .Marland Kitchen... 1 by mouth two times a day    Aspirin 81 Mg Tabs (Aspirin) .Marland Kitchen... 1 by mouth once daily  Complete Medication List: 1)  Multivitamins Tabs (Multiple vitamin) .Marland Kitchen.. 1 by mouth qd 2)  Lisinopril 40 Mg Tabs (Lisinopril) .Marland Kitchen.. 1 by mouth once daily -per patient cardiology changed 3)  Crestor 20 Mg Tabs (Rosuvastatin calcium) .Marland Kitchen.. 1 by mouth once daily 4)  Metformin Hcl 1000 Mg Tabs (Metformin hcl) .Marland KitchenMarland KitchenMarland Kitchen  1 by mouth two times a day 5)  Chromium 200 Mcg Tabs (Chromium) .Marland Kitchen.. 1 tab by mouth once daily 6)  Calcium-magnesium 500-250 Mg Tabs (Calcium-magnesium) .Marland Kitchen.. 1 tab by mouth once daily 7)  Aspirin 81 Mg Tabs (Aspirin) .Marland Kitchen.. 1 by mouth once daily 8)  Furosemide 40 Mg Tabs (Furosemide) .Marland Kitchen.. 1 by mouth once daily 9)  Klor-con M20 20 Meq Tbcr (Potassium chloride crys cr) .Marland Kitchen.. 1 by mouth once daily 10)  Onetouch Lancets Misc (Lancets) .... Test once daily dx: 250.00 11)  Onetouch Test Strp (Glucose blood) .... Tests one time a day dx:250.00 12)  Advair Diskus 100-50 Mcg/dose Misc (Fluticasone-salmeterol) .Marland Kitchen.. 1 inhalation every 12 hrs ; gargle & swallow after use 13)  Nu-iron 150 Mg Caps (Polysaccharide iron  complex) .Marland Kitchen.. 1 by mouth once daily 14)  Doxycycline Hyclate 100 Mg Caps (Doxycycline hyclate) .Marland Kitchen.. 1 two times a day x 5 days then once daily ; avoid sun 15)  Hydroxyzine Pamoate 25 Mg Caps (Hydroxyzine pamoate) .Marland Kitchen.. 1-2 at bedtime as needed for itching  Patient Instructions: 1)  1 cup of salt boiled in 1 gallon of water. Apply cooled water two times a day to rash. Wear sandals until rash is gone. Take vitamin B12 once daily .  2)  Please schedule a follow-up appointment in 3 months. 3)  HbgA1C & B12 level  prior to visit, ICD-9:250.00, 281.0 Prescriptions: HYDROXYZINE PAMOATE 25 MG CAPS (HYDROXYZINE PAMOATE) 1-2 at bedtime as needed for itching  #20 x 0   Entered and Authorized by:   Marga Melnick MD   Signed by:   Marga Melnick MD on 06/12/2010   Method used:   Faxed to ...       [facility] [address] (retail)       [phone]       [place], [place]  [address]       Ph: [phone]       Fax: [phone]   RxID:   [id number] DOXYCYCLINE HYCLATE 100 MG CAPS (DOXYCYCLINE HYCLATE) 1 two times a day X 5 days then once daily ; avoid sun  #15 x 0   Entered and Authorized by:   Marga Melnick MD   Signed by:   Marga Melnick MD on 06/12/2010   Method used:   Faxed to ...       [facility] [address] (retail)       [phone]       [place], [place]  [address]       Ph: [phone]       Fax: [phone]   RxID:   [id number]

## 2010-12-07 NOTE — Progress Notes (Signed)
Summary: XRay Results  Phone Note Outgoing Call Call back at Home Phone [phone]   Call placed by: Shonna Chock CMA,  August 28, 2010 3:24 PM Call placed to: Patient Summary of Call: Spoke with patient: Copy of report to be mailed  Possible R middle lobe pneumonia ("walking pneumonia"); please take Azithromycin 250 mg once daily X 6 days. ( do not take 2 pills day #1). Repeat CXray in 1 week.  [facility] [address]  Shonna Chock CMA  August 28, 2010 3:25 PM   Follow-up for Phone Call        RX sent through OV (see OV note Today) Follow-up by: Shonna Chock CMA,  August 28, 2010 3:41 PM     Appended Document: Orders Update     Clinical Lists Changes  Orders: Added new Test order of T-2 View CXR ([id number]) - Signed XRay order mailed to patient./[facility] CMA  September 01, 2010 8:29 AM

## 2010-12-14 ENCOUNTER — Ambulatory Visit (INDEPENDENT_AMBULATORY_CARE_PROVIDER_SITE_OTHER): Payer: Medicare Other | Admitting: Internal Medicine

## 2010-12-14 ENCOUNTER — Encounter: Payer: Self-pay | Admitting: Internal Medicine

## 2010-12-14 DIAGNOSIS — M25519 Pain in unspecified shoulder: Secondary | ICD-10-CM | POA: Insufficient documentation

## 2010-12-14 DIAGNOSIS — R079 Chest pain, unspecified: Secondary | ICD-10-CM

## 2010-12-21 NOTE — Assessment & Plan Note (Signed)
Summary: 3 MONTH FOLLOWUP-DISCUSS LABS///SPH   Vital Signs:  Patient profile:   70 year old female Weight:      275.6 pounds BMI:     50.59 Temp:     97.6 degrees F oral Pulse rate:   60 / minute Resp:     15 per minute BP sitting:   128 / 80  (left arm) Cuff size:   large  Vitals Entered By: Shonna Chock CMA (December 14, 2010 10:33 AM) CC: 1.) 3 month follow-up: discuss labs/copy given  2.)Shoulder pain    Primary Care Provider:  Marga Melnick MD  CC:  1.) 3 month follow-up: discuss labs/copy given  2.)Shoulder pain .  History of Present Illness:    Labs reviewed & risks discussed. A1c has decreased from 7.4 to 7.1%. She has seen the Podiatrist. She has  had chest pain since 12/10/2010 after eating spicey dip & chili & drinking  wine . The patient reports possible  worsening of pain with deep breathing , but denies  exertional chest pain ( she did Ellipitical , bowling, & water aerobics w/o increased pain), nausea, vomiting, diaphoresis, palpitations, dizziness, and indigestion.  The pain is described as constant since 02/05.  The pain is located in the left anterior chest.  The pain radiated  to the right shoulder today;the chest pain resolved last night .  The  chest pain  was partially  relieved or improved with antacids (TUMS ) 02/05.Wheezing started  last night ; albuteral helped . The shoulder pain is resolving ; she did  not take Tramadol.  Current Medications (verified): 1)  Multivitamins  Tabs (Multiple Vitamin) .Marland Kitchen.. 1 By Mouth Qd 2)  Crestor 20 Mg Tabs (Rosuvastatin Calcium) .Marland Kitchen.. 1 By Mouth Once Daily 3)  Chromium 200 Mcg Tabs (Chromium) .Marland Kitchen.. 1 Tab By Mouth Once Daily 4)  Calcium-Magnesium 500-250 Mg Tabs (Calcium-Magnesium) .Marland Kitchen.. 1 Tab By Mouth Once Daily 5)  Aspirin 81 Mg Tabs (Aspirin) .Marland Kitchen.. 1 By Mouth Once Daily 6)  Furosemide 40 Mg  Tabs (Furosemide) .... 2 Tablets By Mouth Once Daily 7)  Klor-Con M20 20 Meq  Tbcr (Potassium Chloride Crys Cr) .... 2 By Mouth Once  Daily 8)  Onetouch Lancets   Misc (Lancets) .... Test Once Daily Dx: 250.00 9)  Onetouch Test   Strp (Glucose Blood) .... Tests One Time A Day Dx:250.00 10)  Losartan Potassium 100 Mg Tabs (Losartan Potassium) .Marland Kitchen.. 1 Once Daily in Place of Lisinopril 11)  Dulera 200-5 Mcg/act Aero (Mometasone Furo-Formoterol Fum) .... 2 Puffs Every 12 Hrs; Gargle & Spit After Use 12)  Tramadol Hcl 50 Mg Tabs (Tramadol Hcl) .Marland Kitchen.. 1 By Mouth Every 6 Hours As Needed 13)  Janumet 50-1000 Mg Tabs (Sitagliptin-Metformin Hcl) .Marland Kitchen.. 1 Two Times A Day With 2 Largest Meals  Allergies: 1)  Septra  Physical Exam  General:  in no acute distress; alert,appropriate and cooperative throughout examination;overweight-appearing.   Eyes:  No corneal or conjunctival inflammation noted. No icterus  grossly normal. Mouth:  Oral mucosa and oropharynx without lesions or exudates.  No pharyngeal erythema.   Lungs:  Normal respiratory effort, chest expands symmetrically. Lungs are clear to auscultation, no crackles or wheezes. Heart:  normal rate, regular rhythm, no gallop, no rub, no JVD, and grade1.5  /6 systolic murmur with carotid radiation.   Abdomen:  Bowel sounds positive,abdomen soft and non-tender without masses, organomegaly or hernias noted. Protuberant  abdomen Pulses:  R and L carotid,radia pulses are full and equal bilaterally.  Decreased pedal pulses Extremities:  No clubbing, cyanosis, edema, or deformity noted with normal full range of motion of  R shoulder. Neg Homan's Neurologic:  alert & oriented X3 , but poor historian.   Skin:  Intact without suspicious lesions or rashes. No juaundice Cervical Nodes:  No lymphadenopathy noted Axillary Nodes:  No palpable lymphadenopathy Psych:  normally interactive, good eye contact, and not anxious appearing.     Impression & Recommendations:  Problem # 1:  SHOULDER PAIN, RIGHT (ICD-719.41) improved Her updated medication list for this problem includes:    Aspirin 81 Mg  Tabs (Aspirin) .Marland Kitchen... 1 by mouth once daily    Tramadol Hcl 50 Mg Tabs (Tramadol hcl) .Marland Kitchen... 1 by mouth every 6 hours as needed  Problem # 2:  CHEST PAIN (ICD-786.50)   HH / GERD etiology suggested  Orders: Prescription Created Electronically [phone])  Problem # 3:  DIABETES MELLITUS, UNCONTROLLED (ICD-250.02) control improved Her updated medication list for this problem includes:    Aspirin 81 Mg Tabs (Aspirin) .Marland Kitchen... 1 by mouth once daily    Losartan Potassium 100 Mg Tabs (Losartan potassium) .Marland Kitchen... 1 once daily in place of lisinopril    Janumet 50-1000 Mg Tabs (Sitagliptin-metformin hcl) .Marland Kitchen... 1 two times a day with 2 largest meals  Complete Medication List: 1)  Multivitamins Tabs (Multiple vitamin) .Marland Kitchen.. 1 by mouth qd 2)  Crestor 20 Mg Tabs (Rosuvastatin calcium) .Marland Kitchen.. 1 by mouth once daily 3)  Chromium 200 Mcg Tabs (Chromium) .Marland Kitchen.. 1 tab by mouth once daily 4)  Calcium-magnesium 500-250 Mg Tabs (Calcium-magnesium) .Marland Kitchen.. 1 tab by mouth once daily 5)  Aspirin 81 Mg Tabs (Aspirin) .Marland Kitchen.. 1 by mouth once daily 6)  Furosemide 40 Mg Tabs (Furosemide) .... 2 tablets by mouth once daily 7)  Klor-con M20 20 Meq Tbcr (Potassium chloride crys cr) .... 2 by mouth once daily 8)  Onetouch Lancets Misc (Lancets) .... Test once daily dx: 250.00 9)  Onetouch Test Strp (Glucose blood) .... Tests one time a day dx:250.00 10)  Losartan Potassium 100 Mg Tabs (Losartan potassium) .Marland Kitchen.. 1 once daily in place of lisinopril 11)  Dulera 200-5 Mcg/act Aero (Mometasone furo-formoterol fum) .... 2 puffs every 12 hrs; gargle & spit after use 12)  Tramadol Hcl 50 Mg Tabs (Tramadol hcl) .Marland Kitchen.. 1 by mouth every 6 hours as needed 13)  Janumet 50-1000 Mg Tabs (Sitagliptin-metformin hcl) .Marland Kitchen.. 1 two times a day with 2 largest meals 14)  Ranitidine Hcl 150 Mg Tabs (Ranitidine hcl) .Marland Kitchen.. 1 two times a day pre b'fast & eve meal  Patient Instructions: 1)  Avoid foods high in acid (tomatoes, citrus juices, spicy foods). Avoid eating  within two hours of lying down or before exercising. Do not over eat; try smaller more frequent meals. Elevate head of bed twelve inches when sleeping. Take Tramadol as needed for joint pain. Call if shoulder or chest pain recur on Ranitidine. 2)  Please schedule a follow-up appointment in 4 months. 3)  HbgA1C prior to visit, ICD-9:250.02 4)  Urine Microalbumin prior to visit, ICD-9:250.02 Prescriptions: RANITIDINE HCL 150 MG TABS (RANITIDINE HCL) 1 two times a day pre b'fast & eve meal  #60 x 2   Entered and Authorized by:   Marga Melnick MD   Signed by:   Marga Melnick MD on 12/14/2010   Method used:   Electronically to        [facility] [address] (retail)       [phone]  [place], [place]  [address]       Ph: [phone]       Fax: [phone]   RxID:   [id number]    Orders Added: 1)  Est. Patient Level IV [35573] 2)  Prescription Created Electronically [phone]

## 2011-01-15 ENCOUNTER — Encounter: Payer: Self-pay | Admitting: Internal Medicine

## 2011-01-17 ENCOUNTER — Encounter: Payer: Self-pay | Admitting: *Deleted

## 2011-01-19 ENCOUNTER — Encounter: Payer: Self-pay | Admitting: Gastroenterology

## 2011-01-21 LAB — POCT I-STAT 3, ART BLOOD GAS (G3+)
Acid-Base Excess: 1 mmol/L (ref 0.0–2.0)
Acid-Base Excess: 2 mmol/L (ref 0.0–2.0)
Acid-base deficit: 3 mmol/L — ABNORMAL HIGH (ref 0.0–2.0)
Acid-base deficit: 3 mmol/L — ABNORMAL HIGH (ref 0.0–2.0)
Bicarbonate: 23.1 mEq/L (ref 20.0–24.0)
Bicarbonate: 23.3 mEq/L (ref 20.0–24.0)
Bicarbonate: 25.4 mEq/L — ABNORMAL HIGH (ref 20.0–24.0)
Bicarbonate: 25.4 mEq/L — ABNORMAL HIGH (ref 20.0–24.0)
Bicarbonate: 26.2 mEq/L — ABNORMAL HIGH (ref 20.0–24.0)
O2 Saturation: 100 %
O2 Saturation: 100 %
O2 Saturation: 100 %
O2 Saturation: 96 %
O2 Saturation: 98 %
Patient temperature: 35.7
Patient temperature: 36.8
TCO2: 24 mmol/L (ref 0–100)
TCO2: 25 mmol/L (ref 0–100)
TCO2: 27 mmol/L (ref 0–100)
TCO2: 27 mmol/L (ref 0–100)
TCO2: 27 mmol/L (ref 0–100)
pCO2 arterial: 37.8 mmHg (ref 35.0–45.0)
pCO2 arterial: 37.8 mmHg (ref 35.0–45.0)
pCO2 arterial: 41.7 mmHg (ref 35.0–45.0)
pCO2 arterial: 42.5 mmHg (ref 35.0–45.0)
pCO2 arterial: 45.5 mmHg — ABNORMAL HIGH (ref 35.0–45.0)
pH, Arterial: 7.316 — ABNORMAL LOW (ref 7.350–7.400)
pH, Arterial: 7.344 — ABNORMAL LOW (ref 7.350–7.400)
pH, Arterial: 7.385 (ref 7.350–7.400)
pH, Arterial: 7.436 — ABNORMAL HIGH (ref 7.350–7.400)
pH, Arterial: 7.448 — ABNORMAL HIGH (ref 7.350–7.400)
pO2, Arterial: 228 mmHg — ABNORMAL HIGH (ref 80.0–100.0)
pO2, Arterial: 256 mmHg — ABNORMAL HIGH (ref 80.0–100.0)
pO2, Arterial: 460 mmHg — ABNORMAL HIGH (ref 80.0–100.0)
pO2, Arterial: 91 mmHg (ref 80.0–100.0)
pO2, Arterial: 97 mmHg (ref 80.0–100.0)

## 2011-01-21 LAB — BASIC METABOLIC PANEL
BUN: 15 mg/dL (ref 6–23)
BUN: 19 mg/dL (ref 6–23)
BUN: 21 mg/dL (ref 6–23)
BUN: 21 mg/dL (ref 6–23)
BUN: 28 mg/dL — ABNORMAL HIGH (ref 6–23)
BUN: 29 mg/dL — ABNORMAL HIGH (ref 6–23)
BUN: 31 mg/dL — ABNORMAL HIGH (ref 6–23)
BUN: 35 mg/dL — ABNORMAL HIGH (ref 6–23)
CO2: 22 mEq/L (ref 19–32)
CO2: 24 mEq/L (ref 19–32)
CO2: 24 mEq/L (ref 19–32)
CO2: 25 mEq/L (ref 19–32)
CO2: 26 mEq/L (ref 19–32)
CO2: 27 mEq/L (ref 19–32)
CO2: 29 mEq/L (ref 19–32)
Calcium: 7.7 mg/dL — ABNORMAL LOW (ref 8.4–10.5)
Calcium: 7.9 mg/dL — ABNORMAL LOW (ref 8.4–10.5)
Calcium: 7.9 mg/dL — ABNORMAL LOW (ref 8.4–10.5)
Calcium: 8.1 mg/dL — ABNORMAL LOW (ref 8.4–10.5)
Calcium: 8.2 mg/dL — ABNORMAL LOW (ref 8.4–10.5)
Calcium: 8.3 mg/dL — ABNORMAL LOW (ref 8.4–10.5)
Calcium: 8.7 mg/dL (ref 8.4–10.5)
Chloride: 102 mEq/L (ref 96–112)
Chloride: 103 mEq/L (ref 96–112)
Chloride: 107 mEq/L (ref 96–112)
Chloride: 95 mEq/L — ABNORMAL LOW (ref 96–112)
Chloride: 97 mEq/L (ref 96–112)
Chloride: 98 mEq/L (ref 96–112)
Chloride: 99 mEq/L (ref 96–112)
Creatinine, Ser: 0.63 mg/dL (ref 0.4–1.2)
Creatinine, Ser: 0.66 mg/dL (ref 0.4–1.2)
Creatinine, Ser: 0.71 mg/dL (ref 0.4–1.2)
Creatinine, Ser: 0.8 mg/dL (ref 0.4–1.2)
Creatinine, Ser: 0.82 mg/dL (ref 0.4–1.2)
Creatinine, Ser: 1.27 mg/dL — ABNORMAL HIGH (ref 0.4–1.2)
Creatinine, Ser: 1.56 mg/dL — ABNORMAL HIGH (ref 0.4–1.2)
GFR calc Af Amer: 40 mL/min — ABNORMAL LOW (ref >60)
GFR calc Af Amer: 51 mL/min — ABNORMAL LOW (ref >60)
GFR calc Af Amer: >60 mL/min (ref >60)
GFR calc Af Amer: >60 mL/min (ref >60)
GFR calc Af Amer: >60 mL/min (ref >60)
GFR calc Af Amer: >60 mL/min (ref >60)
GFR calc Af Amer: >60 mL/min (ref >60)
GFR calc Af Amer: >60 mL/min (ref >60)
GFR calc non Af Amer: 33 mL/min — ABNORMAL LOW (ref >60)
GFR calc non Af Amer: 42 mL/min — ABNORMAL LOW (ref >60)
GFR calc non Af Amer: >60 mL/min (ref >60)
GFR calc non Af Amer: >60 mL/min (ref >60)
GFR calc non Af Amer: >60 mL/min (ref >60)
GFR calc non Af Amer: >60 mL/min (ref >60)
GFR calc non Af Amer: >60 mL/min (ref >60)
GFR calc non Af Amer: >60 mL/min (ref >60)
Glucose, Bld: 120 mg/dL — ABNORMAL HIGH (ref 70–99)
Glucose, Bld: 121 mg/dL — ABNORMAL HIGH (ref 70–99)
Glucose, Bld: 142 mg/dL — ABNORMAL HIGH (ref 70–99)
Glucose, Bld: 165 mg/dL — ABNORMAL HIGH (ref 70–99)
Glucose, Bld: 300 mg/dL — ABNORMAL HIGH (ref 70–99)
Glucose, Bld: 77 mg/dL (ref 70–99)
Glucose, Bld: 93 mg/dL (ref 70–99)
Potassium: 3.9 mEq/L (ref 3.5–5.1)
Potassium: 3.9 mEq/L (ref 3.5–5.1)
Potassium: 4.1 mEq/L (ref 3.5–5.1)
Potassium: 4.3 mEq/L (ref 3.5–5.1)
Potassium: 4.3 mEq/L (ref 3.5–5.1)
Potassium: 4.4 mEq/L (ref 3.5–5.1)
Potassium: 4.4 mEq/L (ref 3.5–5.1)
Potassium: 4.7 mEq/L (ref 3.5–5.1)
Sodium: 125 mEq/L — ABNORMAL LOW (ref 135–145)
Sodium: 132 mEq/L — ABNORMAL LOW (ref 135–145)
Sodium: 133 mEq/L — ABNORMAL LOW (ref 135–145)
Sodium: 133 mEq/L — ABNORMAL LOW (ref 135–145)
Sodium: 134 mEq/L — ABNORMAL LOW (ref 135–145)
Sodium: 135 mEq/L (ref 135–145)
Sodium: 135 mEq/L (ref 135–145)
Sodium: 137 mEq/L (ref 135–145)

## 2011-01-21 LAB — GLUCOSE, CAPILLARY
Glucose-Capillary: 105 mg/dL — ABNORMAL HIGH (ref 70–99)
Glucose-Capillary: 107 mg/dL — ABNORMAL HIGH (ref 70–99)
Glucose-Capillary: 108 mg/dL — ABNORMAL HIGH (ref 70–99)
Glucose-Capillary: 109 mg/dL — ABNORMAL HIGH (ref 70–99)
Glucose-Capillary: 109 mg/dL — ABNORMAL HIGH (ref 70–99)
Glucose-Capillary: 110 mg/dL — ABNORMAL HIGH (ref 70–99)
Glucose-Capillary: 119 mg/dL — ABNORMAL HIGH (ref 70–99)
Glucose-Capillary: 122 mg/dL — ABNORMAL HIGH (ref 70–99)
Glucose-Capillary: 126 mg/dL — ABNORMAL HIGH (ref 70–99)
Glucose-Capillary: 128 mg/dL — ABNORMAL HIGH (ref 70–99)
Glucose-Capillary: 129 mg/dL — ABNORMAL HIGH (ref 70–99)
Glucose-Capillary: 130 mg/dL — ABNORMAL HIGH (ref 70–99)
Glucose-Capillary: 131 mg/dL — ABNORMAL HIGH (ref 70–99)
Glucose-Capillary: 136 mg/dL — ABNORMAL HIGH (ref 70–99)
Glucose-Capillary: 138 mg/dL — ABNORMAL HIGH (ref 70–99)
Glucose-Capillary: 141 mg/dL — ABNORMAL HIGH (ref 70–99)
Glucose-Capillary: 142 mg/dL — ABNORMAL HIGH (ref 70–99)
Glucose-Capillary: 149 mg/dL — ABNORMAL HIGH (ref 70–99)
Glucose-Capillary: 149 mg/dL — ABNORMAL HIGH (ref 70–99)
Glucose-Capillary: 153 mg/dL — ABNORMAL HIGH (ref 70–99)
Glucose-Capillary: 154 mg/dL — ABNORMAL HIGH (ref 70–99)
Glucose-Capillary: 158 mg/dL — ABNORMAL HIGH (ref 70–99)
Glucose-Capillary: 160 mg/dL — ABNORMAL HIGH (ref 70–99)
Glucose-Capillary: 161 mg/dL — ABNORMAL HIGH (ref 70–99)
Glucose-Capillary: 164 mg/dL — ABNORMAL HIGH (ref 70–99)
Glucose-Capillary: 170 mg/dL — ABNORMAL HIGH (ref 70–99)
Glucose-Capillary: 172 mg/dL — ABNORMAL HIGH (ref 70–99)
Glucose-Capillary: 174 mg/dL — ABNORMAL HIGH (ref 70–99)
Glucose-Capillary: 178 mg/dL — ABNORMAL HIGH (ref 70–99)
Glucose-Capillary: 194 mg/dL — ABNORMAL HIGH (ref 70–99)
Glucose-Capillary: 210 mg/dL — ABNORMAL HIGH (ref 70–99)
Glucose-Capillary: 213 mg/dL — ABNORMAL HIGH (ref 70–99)
Glucose-Capillary: 217 mg/dL — ABNORMAL HIGH (ref 70–99)
Glucose-Capillary: 224 mg/dL — ABNORMAL HIGH (ref 70–99)
Glucose-Capillary: 63 mg/dL — ABNORMAL LOW (ref 70–99)
Glucose-Capillary: 64 mg/dL — ABNORMAL LOW (ref 70–99)
Glucose-Capillary: 67 mg/dL — ABNORMAL LOW (ref 70–99)
Glucose-Capillary: 69 mg/dL — ABNORMAL LOW (ref 70–99)
Glucose-Capillary: 70 mg/dL (ref 70–99)
Glucose-Capillary: 73 mg/dL (ref 70–99)
Glucose-Capillary: 79 mg/dL (ref 70–99)
Glucose-Capillary: 81 mg/dL (ref 70–99)
Glucose-Capillary: 83 mg/dL (ref 70–99)
Glucose-Capillary: 85 mg/dL (ref 70–99)
Glucose-Capillary: 87 mg/dL (ref 70–99)
Glucose-Capillary: 89 mg/dL (ref 70–99)
Glucose-Capillary: 90 mg/dL (ref 70–99)
Glucose-Capillary: 98 mg/dL (ref 70–99)

## 2011-01-21 LAB — POCT I-STAT 3, VENOUS BLOOD GAS (G3P V)
Bicarbonate: 24.8 mEq/L — ABNORMAL HIGH (ref 20.0–24.0)
O2 Saturation: 76 %
TCO2: 26 mmol/L (ref 0–100)
pCO2, Ven: 39.6 mmHg — ABNORMAL LOW (ref 45.0–50.0)
pH, Ven: 7.404 — ABNORMAL HIGH (ref 7.250–7.300)
pO2, Ven: 41 mmHg (ref 30.0–45.0)

## 2011-01-21 LAB — CBC
HCT: 24.1 % — ABNORMAL LOW (ref 36.0–46.0)
HCT: 24.5 % — ABNORMAL LOW (ref 36.0–46.0)
HCT: 26.5 % — ABNORMAL LOW (ref 36.0–46.0)
HCT: 27.1 % — ABNORMAL LOW (ref 36.0–46.0)
HCT: 27.3 % — ABNORMAL LOW (ref 36.0–46.0)
HCT: 29.2 % — ABNORMAL LOW (ref 36.0–46.0)
HCT: 30 % — ABNORMAL LOW (ref 36.0–46.0)
HCT: 31.6 % — ABNORMAL LOW (ref 36.0–46.0)
HCT: 32.1 % — ABNORMAL LOW (ref 36.0–46.0)
HCT: 34.8 % — ABNORMAL LOW (ref 36.0–46.0)
Hemoglobin: 10 g/dL — ABNORMAL LOW (ref 12.0–15.0)
Hemoglobin: 10.1 g/dL — ABNORMAL LOW (ref 12.0–15.0)
Hemoglobin: 10.7 g/dL — ABNORMAL LOW (ref 12.0–15.0)
Hemoglobin: 10.8 g/dL — ABNORMAL LOW (ref 12.0–15.0)
Hemoglobin: 11.9 g/dL — ABNORMAL LOW (ref 12.0–15.0)
Hemoglobin: 8.2 g/dL — ABNORMAL LOW (ref 12.0–15.0)
Hemoglobin: 8.9 g/dL — ABNORMAL LOW (ref 12.0–15.0)
Hemoglobin: 9.2 g/dL — ABNORMAL LOW (ref 12.0–15.0)
Hemoglobin: 9.2 g/dL — ABNORMAL LOW (ref 12.0–15.0)
MCHC: 33.5 g/dL (ref 30.0–36.0)
MCHC: 33.7 g/dL (ref 30.0–36.0)
MCHC: 33.7 g/dL (ref 30.0–36.0)
MCHC: 33.8 g/dL (ref 30.0–36.0)
MCHC: 33.8 g/dL (ref 30.0–36.0)
MCHC: 34 g/dL (ref 30.0–36.0)
MCHC: 34 g/dL (ref 30.0–36.0)
MCHC: 34.1 g/dL (ref 30.0–36.0)
MCHC: 34.1 g/dL (ref 30.0–36.0)
MCV: 87.4 fL (ref 78.0–100.0)
MCV: 87.8 fL (ref 78.0–100.0)
MCV: 87.8 fL (ref 78.0–100.0)
MCV: 87.9 fL (ref 78.0–100.0)
MCV: 88 fL (ref 78.0–100.0)
MCV: 88.2 fL (ref 78.0–100.0)
MCV: 88.4 fL (ref 78.0–100.0)
MCV: 88.6 fL (ref 78.0–100.0)
MCV: 88.8 fL (ref 78.0–100.0)
Platelets: 116 10*3/uL — ABNORMAL LOW (ref 150–400)
Platelets: 120 10*3/uL — ABNORMAL LOW (ref 150–400)
Platelets: 123 10*3/uL — ABNORMAL LOW (ref 150–400)
Platelets: 144 10*3/uL — ABNORMAL LOW (ref 150–400)
Platelets: 153 10*3/uL (ref 150–400)
Platelets: 163 10*3/uL (ref 150–400)
Platelets: 208 10*3/uL (ref 150–400)
Platelets: 85 10*3/uL — ABNORMAL LOW (ref 150–400)
Platelets: 91 10*3/uL — ABNORMAL LOW (ref 150–400)
Platelets: 95 10*3/uL — ABNORMAL LOW (ref 150–400)
RBC: 2.73 MIL/uL — ABNORMAL LOW (ref 3.87–5.11)
RBC: 3.02 MIL/uL — ABNORMAL LOW (ref 3.87–5.11)
RBC: 3.08 MIL/uL — ABNORMAL LOW (ref 3.87–5.11)
RBC: 3.13 MIL/uL — ABNORMAL LOW (ref 3.87–5.11)
RBC: 3.3 MIL/uL — ABNORMAL LOW (ref 3.87–5.11)
RBC: 3.39 MIL/uL — ABNORMAL LOW (ref 3.87–5.11)
RBC: 3.56 MIL/uL — ABNORMAL LOW (ref 3.87–5.11)
RBC: 3.65 MIL/uL — ABNORMAL LOW (ref 3.87–5.11)
RBC: 3.96 MIL/uL (ref 3.87–5.11)
RDW: 13.8 % (ref 11.5–15.5)
RDW: 13.9 % (ref 11.5–15.5)
RDW: 13.9 % (ref 11.5–15.5)
RDW: 14 % (ref 11.5–15.5)
RDW: 14 % (ref 11.5–15.5)
RDW: 14.3 % (ref 11.5–15.5)
RDW: 14.4 % (ref 11.5–15.5)
RDW: 14.4 % (ref 11.5–15.5)
RDW: 14.8 % (ref 11.5–15.5)
WBC: 10 10*3/uL (ref 4.0–10.5)
WBC: 12.6 10*3/uL — ABNORMAL HIGH (ref 4.0–10.5)
WBC: 13.2 10*3/uL — ABNORMAL HIGH (ref 4.0–10.5)
WBC: 13.5 10*3/uL — ABNORMAL HIGH (ref 4.0–10.5)
WBC: 15.6 10*3/uL — ABNORMAL HIGH (ref 4.0–10.5)
WBC: 15.8 10*3/uL — ABNORMAL HIGH (ref 4.0–10.5)
WBC: 18.5 10*3/uL — ABNORMAL HIGH (ref 4.0–10.5)
WBC: 8.4 10*3/uL (ref 4.0–10.5)
WBC: 9.1 10*3/uL (ref 4.0–10.5)
WBC: 9.6 10*3/uL (ref 4.0–10.5)

## 2011-01-21 LAB — POCT I-STAT 4, (NA,K, GLUC, HGB,HCT)
Glucose, Bld: 100 mg/dL — ABNORMAL HIGH (ref 70–99)
Glucose, Bld: 113 mg/dL — ABNORMAL HIGH (ref 70–99)
Glucose, Bld: 113 mg/dL — ABNORMAL HIGH (ref 70–99)
Glucose, Bld: 120 mg/dL — ABNORMAL HIGH (ref 70–99)
Glucose, Bld: 121 mg/dL — ABNORMAL HIGH (ref 70–99)
Glucose, Bld: 125 mg/dL — ABNORMAL HIGH (ref 70–99)
Glucose, Bld: 125 mg/dL — ABNORMAL HIGH (ref 70–99)
Glucose, Bld: 130 mg/dL — ABNORMAL HIGH (ref 70–99)
Glucose, Bld: 155 mg/dL — ABNORMAL HIGH (ref 70–99)
HCT: 26 % — ABNORMAL LOW (ref 36.0–46.0)
HCT: 26 % — ABNORMAL LOW (ref 36.0–46.0)
HCT: 26 % — ABNORMAL LOW (ref 36.0–46.0)
HCT: 26 % — ABNORMAL LOW (ref 36.0–46.0)
HCT: 27 % — ABNORMAL LOW (ref 36.0–46.0)
HCT: 28 % — ABNORMAL LOW (ref 36.0–46.0)
HCT: 30 % — ABNORMAL LOW (ref 36.0–46.0)
HCT: 31 % — ABNORMAL LOW (ref 36.0–46.0)
HCT: 35 % — ABNORMAL LOW (ref 36.0–46.0)
Hemoglobin: 10.2 g/dL — ABNORMAL LOW (ref 12.0–15.0)
Hemoglobin: 10.5 g/dL — ABNORMAL LOW (ref 12.0–15.0)
Hemoglobin: 11.9 g/dL — ABNORMAL LOW (ref 12.0–15.0)
Hemoglobin: 8.8 g/dL — ABNORMAL LOW (ref 12.0–15.0)
Hemoglobin: 8.8 g/dL — ABNORMAL LOW (ref 12.0–15.0)
Hemoglobin: 8.8 g/dL — ABNORMAL LOW (ref 12.0–15.0)
Hemoglobin: 8.8 g/dL — ABNORMAL LOW (ref 12.0–15.0)
Hemoglobin: 9.2 g/dL — ABNORMAL LOW (ref 12.0–15.0)
Hemoglobin: 9.5 g/dL — ABNORMAL LOW (ref 12.0–15.0)
Potassium: 3.6 mEq/L (ref 3.5–5.1)
Potassium: 3.9 mEq/L (ref 3.5–5.1)
Potassium: 4.1 mEq/L (ref 3.5–5.1)
Potassium: 4.1 mEq/L (ref 3.5–5.1)
Potassium: 4.2 mEq/L (ref 3.5–5.1)
Potassium: 4.3 mEq/L (ref 3.5–5.1)
Potassium: 4.3 mEq/L (ref 3.5–5.1)
Potassium: 4.8 mEq/L (ref 3.5–5.1)
Potassium: 5 mEq/L (ref 3.5–5.1)
Sodium: 134 mEq/L — ABNORMAL LOW (ref 135–145)
Sodium: 134 mEq/L — ABNORMAL LOW (ref 135–145)
Sodium: 135 mEq/L (ref 135–145)
Sodium: 135 mEq/L (ref 135–145)
Sodium: 135 mEq/L (ref 135–145)
Sodium: 138 mEq/L (ref 135–145)
Sodium: 138 mEq/L (ref 135–145)
Sodium: 139 mEq/L (ref 135–145)
Sodium: 141 mEq/L (ref 135–145)

## 2011-01-21 LAB — POCT I-STAT, CHEM 8
BUN: 17 mg/dL (ref 6–23)
Calcium, Ion: 1.22 mmol/L (ref 1.12–1.32)
Chloride: 107 mEq/L (ref 96–112)
Creatinine, Ser: 0.8 mg/dL (ref 0.4–1.2)
Glucose, Bld: 138 mg/dL — ABNORMAL HIGH (ref 70–99)
HCT: 33 % — ABNORMAL LOW (ref 36.0–46.0)
Hemoglobin: 11.2 g/dL — ABNORMAL LOW (ref 12.0–15.0)
Potassium: 4.1 mEq/L (ref 3.5–5.1)
Sodium: 142 mEq/L (ref 135–145)
TCO2: 25 mmol/L (ref 0–100)

## 2011-01-21 LAB — TYPE AND SCREEN
ABO/RH(D): A POS
Antibody Screen: NEGATIVE

## 2011-01-21 LAB — MAGNESIUM
Magnesium: 2.1 mg/dL (ref 1.5–2.5)
Magnesium: 2.3 mg/dL (ref 1.5–2.5)

## 2011-01-21 LAB — APTT: aPTT: 29 seconds (ref 24–37)

## 2011-01-21 LAB — PLATELET COUNT: Platelets: 153 10*3/uL (ref 150–400)

## 2011-01-21 LAB — PROTIME-INR
INR: 1.24 (ref 0.00–1.49)
Prothrombin Time: 15.5 seconds — ABNORMAL HIGH (ref 11.6–15.2)

## 2011-01-21 LAB — HEMOGLOBIN AND HEMATOCRIT, BLOOD
HCT: 24.4 % — ABNORMAL LOW (ref 36.0–46.0)
Hemoglobin: 8.3 g/dL — ABNORMAL LOW (ref 12.0–15.0)

## 2011-01-21 LAB — MRSA PCR SCREENING: MRSA by PCR: NEGATIVE

## 2011-01-21 LAB — HEPARIN INDUCED THROMBOCYTOPENIA PNL
Heparin Induced Plt Ab: NEGATIVE
Patient O.D.: 0.179
Serotonin Release: 0 % release (ref <20)

## 2011-01-21 LAB — CREATININE, SERUM
Creatinine, Ser: 0.58 mg/dL (ref 0.4–1.2)
GFR calc Af Amer: >60 mL/min (ref >60)
GFR calc non Af Amer: >60 mL/min (ref >60)

## 2011-01-23 NOTE — Letter (Signed)
Summary: Diabetic Instructions  [facility]  [address]   [place], [place] [address]   Phone: [phone]  Fax: [phone]    [name] 11/07/1940 MRN: [id number]   x    ORAL DIABETIC MEDICATION INSTRUCTIONS  The day before your procedure:   Take your diabetic pill as you do normally  The day of your procedure:   Do not take your diabetic pill    We will check your blood sugar levels during the admission process and again in Recovery before discharging you home  ________________________________________________________________________

## 2011-01-23 NOTE — Miscellaneous (Signed)
Summary: [facility] Previsit/prep..  Clinical Lists Changes  Medications: Added new medication of MOVIPREP 100 GM  SOLR (PEG-KCL-NACL-NASULF-NA ASC-C) As per prep instructions. - Signed Rx of MOVIPREP 100 GM  SOLR (PEG-KCL-NACL-NASULF-NA ASC-C) As per prep instructions.;  #1 x 0;  Signed;  Entered by: Wyona Almas RN;  Authorized by: Meryl Dare MD Clementeen Graham;  Method used: Electronically to [facility] [phone] [place], [place], [place]  [address], Ph: [phone], Fax: [phone] Observations: Added new observation of ALLERGY REV: Done (01/19/2011 9:54)    Prescriptions: MOVIPREP 100 GM  SOLR (PEG-KCL-NACL-NASULF-NA ASC-C) As per prep instructions.  #1 x 0   Entered by:   Wyona Almas RN   Authorized by:   Meryl Dare MD [facility]   Signed by:   Wyona Almas RN on 01/19/2011   Method used:   Electronically to        [facility]* (retail)       [phone]       [place], [place]  [address]       Ph: [phone]       Fax: [phone]   RxID:   [id number]

## 2011-01-23 NOTE — Letter (Signed)
Summary: Moviprep Instructions  [facility]  [address]   [place], [place] [address]   Phone: [phone]  Fax: [phone]       [name]    [birth year]    MRN: [id number]        Procedure Day /Date: Friday, 02-02-11     Arrival Time: 9:00 a.m.     Procedure Time: 10:00 a.m.     Location of Procedure:                    x  [facility] (4th Floor)                        PREPARATION FOR COLONOSCOPY WITH MOVIPREP   Starting 5 days prior to your procedure 01-28-11 do not eat nuts, seeds, popcorn, corn, beans, peas,  salads, or any raw vegetables.  Do not take any fiber supplements (e.g. Metamucil, Citrucel, and Benefiber).  THE DAY BEFORE YOUR PROCEDURE         DATE: 02-01-11  DAY: Thursday  1.  Drink clear liquids the entire day-NO SOLID FOOD  2.  Do not drink anything colored red or purple.  Avoid juices with pulp.  No orange juice.  3.  Drink at least 64 oz. (8 glasses) of fluid/clear liquids during the day to prevent dehydration and help the prep work efficiently.  CLEAR LIQUIDS INCLUDE: Water Jello Ice Popsicles Tea (sugar ok, no milk/cream) Powdered fruit flavored drinks Coffee (sugar ok, no milk/cream) Gatorade Juice: apple, white grape, white cranberry  Lemonade Clear bullion, consomm, broth Carbonated beverages (any kind) Strained chicken noodle soup Hard Candy                             4.  In the morning, mix first dose of MoviPrep solution:    Empty 1 Pouch A and 1 Pouch B into the disposable container    Add lukewarm drinking water to the top line of the container. Mix to dissolve    Refrigerate (mixed solution should be used within 24 hrs)  5.  Begin drinking the prep at 5:00 p.m. The MoviPrep container is divided by 4 marks.   Every 15 minutes drink the solution down to the next mark (approximately 8 oz) until the full liter is complete.   6.  Follow completed prep with 16 oz of clear liquid of your choice  (Nothing red or purple).  Continue to drink clear liquids until bedtime.  7.  Before going to bed, mix second dose of MoviPrep solution:    Empty 1 Pouch A and 1 Pouch B into the disposable container    Add lukewarm drinking water to the top line of the container. Mix to dissolve    Refrigerate  THE DAY OF YOUR PROCEDURE      DATE: 02-02-11 DAY: Friday  Beginning at 5:00 a.m. (5 hours before procedure):         1. Every 15 minutes, drink the solution down to the next mark (approx 8 oz) until the full liter is complete.  2. Follow completed prep with 16 oz. of clear liquid of your choice.    3. You may drink clear liquids until 8:00 a.m. (2 HOURS BEFORE PROCEDURE).   MEDICATION INSTRUCTIONS  Unless otherwise instructed, you should take regular prescription medications with a small sip of water   as early as possible the morning of your  procedure.  Diabetic patients - see separate instructions.   Additional medication instructions: Hold Furosemide the morning of procedure.         OTHER INSTRUCTIONS  You will need a responsible adult at least 69 years of age to accompany you and drive you home.   This person must remain in the waiting room during your procedure.  Wear loose fitting clothing that is easily removed.  Leave jewelry and other valuables at home.  However, you may wish to bring a book to read or  an iPod/MP3 player to listen to music as you wait for your procedure to start.  Remove all body piercing jewelry and leave at home.  Total time from sign-in until discharge is approximately 2-3 hours.  You should go home directly after your procedure and rest.  You can resume normal activities the  day after your procedure.  The day of your procedure you should not:   Drive   Make legal decisions   Operate machinery   Drink alcohol   Return to work  You will receive specific instructions about eating, activities and medications before you  leave.    The above instructions have been reviewed and explained to me by   Wyona Almas RN  January 19, 2011 10:35 AM     I fully understand and can verbalize these instructions _____________________________ Date _________

## 2011-01-24 LAB — GLUCOSE, CAPILLARY
Glucose-Capillary: 102 mg/dL — ABNORMAL HIGH (ref 70–99)
Glucose-Capillary: 113 mg/dL — ABNORMAL HIGH (ref 70–99)
Glucose-Capillary: 93 mg/dL (ref 70–99)

## 2011-01-28 LAB — GLUCOSE, CAPILLARY: Glucose-Capillary: 99 mg/dL (ref 70–99)

## 2011-02-01 ENCOUNTER — Encounter: Payer: Self-pay | Admitting: *Deleted

## 2011-02-02 ENCOUNTER — Ambulatory Visit (AMBULATORY_SURGERY_CENTER): Payer: Medicare Other | Admitting: Gastroenterology

## 2011-02-02 ENCOUNTER — Encounter: Payer: Self-pay | Admitting: Gastroenterology

## 2011-02-02 VITALS — BP 150/63 | HR 60 | Temp 96.5°F | Resp 12 | Ht 62.5 in | Wt 268.0 lb

## 2011-02-02 DIAGNOSIS — Z86010 Personal history of colonic polyps: Secondary | ICD-10-CM

## 2011-02-02 DIAGNOSIS — D126 Benign neoplasm of colon, unspecified: Secondary | ICD-10-CM

## 2011-02-02 DIAGNOSIS — R195 Other fecal abnormalities: Secondary | ICD-10-CM

## 2011-02-02 NOTE — Patient Instructions (Signed)
Read the handouts regarding your diagnoses.  Hold aspirin products, and anti-inflammatory medication for two weeks.  Call if you have any questions.

## 2011-02-05 ENCOUNTER — Telehealth: Payer: Self-pay

## 2011-02-05 LAB — IRON AND TIBC
Iron: 50 ug/dL (ref 42–135)
Saturation Ratios: 15 % — ABNORMAL LOW (ref 20–55)
TIBC: 336 ug/dL (ref 250–470)
UIBC: 286 ug/dL

## 2011-02-05 LAB — CBC
HCT: 34.3 % — ABNORMAL LOW (ref 36.0–46.0)
HCT: 35 % — ABNORMAL LOW (ref 36.0–46.0)
Hemoglobin: 12 g/dL (ref 12.0–15.0)
MCHC: 33.5 g/dL (ref 30.0–36.0)
MCHC: 34.2 g/dL (ref 30.0–36.0)
MCV: 87.5 fL (ref 78.0–100.0)
MCV: 88 fL (ref 78.0–100.0)
Platelets: 217 10*3/uL (ref 150–400)
Platelets: 224 10*3/uL (ref 150–400)
RBC: 4 MIL/uL (ref 3.87–5.11)
RBC: 4.01 MIL/uL (ref 3.87–5.11)
RDW: 13.7 % (ref 11.5–15.5)
RDW: 14.3 % (ref 11.5–15.5)
WBC: 6.6 10*3/uL (ref 4.0–10.5)
WBC: 6.8 10*3/uL (ref 4.0–10.5)

## 2011-02-05 LAB — GLUCOSE, CAPILLARY
Glucose-Capillary: 107 mg/dL — ABNORMAL HIGH (ref 70–99)
Glucose-Capillary: 125 mg/dL — ABNORMAL HIGH (ref 70–99)
Glucose-Capillary: 134 mg/dL — ABNORMAL HIGH (ref 70–99)
Glucose-Capillary: 141 mg/dL — ABNORMAL HIGH (ref 70–99)
Glucose-Capillary: 144 mg/dL — ABNORMAL HIGH (ref 70–99)

## 2011-02-05 LAB — POCT I-STAT 3, ART BLOOD GAS (G3+)
Acid-Base Excess: 6 mmol/L — ABNORMAL HIGH (ref 0.0–2.0)
O2 Saturation: 93 %
TCO2: 31 mmol/L (ref 0–100)
pCO2 arterial: 42.5 mmHg (ref 35.0–45.0)

## 2011-02-05 LAB — BLOOD GAS, ARTERIAL
Acid-Base Excess: 3.6 mmol/L — ABNORMAL HIGH (ref 0.0–2.0)
Bicarbonate: 27.6 mEq/L — ABNORMAL HIGH (ref 20.0–24.0)
Drawn by: 270271
FIO2: 0.21 %
O2 Saturation: 95.6 %
Patient temperature: 98.6
TCO2: 28.9 mmol/L (ref 0–100)
pCO2 arterial: 42.1 mmHg (ref 35.0–45.0)
pH, Arterial: 7.433 — ABNORMAL HIGH (ref 7.350–7.400)
pO2, Arterial: 77.4 mmHg — ABNORMAL LOW (ref 80.0–100.0)

## 2011-02-05 LAB — DIFFERENTIAL
Basophils Absolute: 0 10*3/uL (ref 0.0–0.1)
Basophils Relative: 0 % (ref 0–1)
Eosinophils Absolute: 0.1 10*3/uL (ref 0.0–0.7)
Eosinophils Relative: 2 % (ref 0–5)
Lymphocytes Relative: 21 % (ref 12–46)
Monocytes Absolute: 0.5 10*3/uL (ref 0.1–1.0)

## 2011-02-05 LAB — COMPREHENSIVE METABOLIC PANEL
ALT: 17 U/L (ref 0–35)
AST: 17 U/L (ref 0–37)
Albumin: 3.4 g/dL — ABNORMAL LOW (ref 3.5–5.2)
Alkaline Phosphatase: 70 U/L (ref 39–117)
BUN: 42 mg/dL — ABNORMAL HIGH (ref 6–23)
CO2: 28 mEq/L (ref 19–32)
Calcium: 8.6 mg/dL (ref 8.4–10.5)
Chloride: 98 mEq/L (ref 96–112)
Creatinine, Ser: 0.94 mg/dL (ref 0.4–1.2)
GFR calc Af Amer: >60 mL/min (ref >60)
GFR calc non Af Amer: 60 mL/min — ABNORMAL LOW (ref >60)
Glucose, Bld: 124 mg/dL — ABNORMAL HIGH (ref 70–99)
Potassium: 4.3 mEq/L (ref 3.5–5.1)
Sodium: 133 mEq/L — ABNORMAL LOW (ref 135–145)
Total Bilirubin: 0.4 mg/dL (ref 0.3–1.2)
Total Protein: 6.8 g/dL (ref 6.0–8.3)

## 2011-02-05 LAB — RETICULOCYTES
RBC.: 4.09 MIL/uL (ref 3.87–5.11)
Retic Ct Pct: 1.2 % (ref 0.4–3.1)

## 2011-02-05 LAB — LIPID PANEL
Cholesterol: 197 mg/dL (ref 0–200)
LDL Cholesterol: 122 mg/dL — ABNORMAL HIGH (ref 0–99)
Total CHOL/HDL Ratio: 3.5 RATIO
VLDL: 18 mg/dL (ref 0–40)

## 2011-02-05 LAB — BASIC METABOLIC PANEL
BUN: 20 mg/dL (ref 6–23)
CO2: 27 mEq/L (ref 19–32)
CO2: 31 mEq/L (ref 19–32)
Calcium: 8.9 mg/dL (ref 8.4–10.5)
Chloride: 98 mEq/L (ref 96–112)
Chloride: 99 mEq/L (ref 96–112)
Creatinine, Ser: 0.99 mg/dL (ref 0.4–1.2)
GFR calc Af Amer: >60 mL/min (ref >60)
GFR calc Af Amer: >60 mL/min (ref >60)
GFR calc non Af Amer: >60 mL/min (ref >60)
Glucose, Bld: 152 mg/dL — ABNORMAL HIGH (ref 70–99)
Potassium: 4 mEq/L (ref 3.5–5.1)
Potassium: 4.1 mEq/L (ref 3.5–5.1)
Sodium: 137 mEq/L (ref 135–145)

## 2011-02-05 LAB — CROSSMATCH
ABO/RH(D): A POS
Antibody Screen: NEGATIVE

## 2011-02-05 LAB — CARDIAC PANEL(CRET KIN+CKTOT+MB+TROPI)
CK, MB: 3 ng/mL (ref 0.3–4.0)
Relative Index: INVALID (ref 0.0–2.5)
Total CK: 69 U/L (ref 7–177)
Total CK: 83 U/L (ref 7–177)
Troponin I: 0.1 ng/mL — ABNORMAL HIGH (ref 0.00–0.06)
Troponin I: 0.1 ng/mL — ABNORMAL HIGH (ref 0.00–0.06)

## 2011-02-05 LAB — PROTIME-INR
INR: 1.03 (ref 0.00–1.49)
INR: 1.06 (ref 0.00–1.49)
Prothrombin Time: 13.4 seconds (ref 11.6–15.2)
Prothrombin Time: 13.7 seconds (ref 11.6–15.2)

## 2011-02-05 LAB — POCT I-STAT 3, VENOUS BLOOD GAS (G3P V)
Acid-Base Excess: 7 mmol/L — ABNORMAL HIGH (ref 0.0–2.0)
O2 Saturation: 67 %
TCO2: 33 mmol/L (ref 0–100)
pCO2, Ven: 47.6 mmHg (ref 45.0–50.0)

## 2011-02-05 LAB — POCT CARDIAC MARKERS
CKMB, poc: 2.3 ng/mL (ref 1.0–8.0)
Myoglobin, poc: 75 ng/mL (ref 12–200)

## 2011-02-05 LAB — HEMOGLOBIN A1C: Hgb A1c MFr Bld: 6.5 % — ABNORMAL HIGH (ref 4.6–6.1)

## 2011-02-05 LAB — MRSA PCR SCREENING: MRSA by PCR: NEGATIVE

## 2011-02-05 LAB — BRAIN NATRIURETIC PEPTIDE
Pro B Natriuretic peptide (BNP): 221 pg/mL — ABNORMAL HIGH (ref 0.0–100.0)
Pro B Natriuretic peptide (BNP): 51 pg/mL (ref 0.0–100.0)

## 2011-02-05 LAB — APTT: aPTT: 29 seconds (ref 24–37)

## 2011-02-05 NOTE — Telephone Encounter (Signed)
Follow up Call- Patient questions:  Do you have a fever, pain , or abdominal swelling? no Pain Score  0 *  Have you tolerated food without any problems? yes  Have you been able to return to your normal activities? yes  Do you have any questions about your discharge instructions: Diet   no Medications  no Follow up visit  no  Do you have questions or concerns about your Care? no  Actions: * If pain score is 4 or above: No action needed, pain <4. Pt had no questions or concerns. maw

## 2011-02-06 ENCOUNTER — Encounter: Payer: Self-pay | Admitting: Gastroenterology

## 2011-02-06 NOTE — Procedures (Signed)
Summary: Colonoscopy  Patient: [name] Note: All result statuses are Final unless otherwise noted.  Tests: (1) Colonoscopy (COL)   COL Colonoscopy           DONE     [facility]     [address]     [place], [place]  [address]          COLONOSCOPY PROCEDURE REPORT     PATIENT:  [name], [name]  MR#:  [id number]     BIRTHDATE:  1941/07/22, 69 yrs. old  GENDER:  female     ENDOSCOPIST:  Judie Petit T. Russella Dar, MD, [facility]          PROCEDURE DATE:  02/02/2011     PROCEDURE:  Colonoscopy with biopsy and snare polypectomy     ASA CLASS:  Class III     INDICATIONS:  1) heme positive stool  2) history of pre-cancerous     (adenomatous) colon polyps : 10/1999.     MEDICATIONS:   Fentanyl 100 mcg IV, Versed 10 mg IV          DESCRIPTION OF PROCEDURE:   After the risks benefits and     alternatives of the procedure were thoroughly explained, informed     consent was obtained.  Digital rectal exam was performed and     revealed no abnormalities.   The LB PCF-H180AL [id number] endoscope     was introduced through the anus and advanced to the cecum, which     was identified by both the appendix and ileocecal valve, without     limitations.  The quality of the prep was good, using MoviPrep.     The instrument was then slowly withdrawn as the colon was fully     examined.     <<PROCEDUREIMAGES>>     FINDINGS:  Three polyps were found in the cecum. They were 4 - 10     mm in size. 10 mm polyp was snared, then cauterized with monopolar     cautery, with piecemeal polypectomy. Retrieval was successful. 6     mm polyp was snared without cautery. Retrieval was successful. The     4 mm polyp was removed using cold biopsy forceps.  A sessile polyp     was found in the ascending colon. It was 5 mm in size. Polyp was     snared without cautery. Retrieval was successful. A normal     appearing ileocecal valve, and appendiceal orifice were     identified. The hepatic flexure, transverse, splenic  flexure,     descending, sigmoid colon, and rectum appeared unremarkable.     Retroflexed views in the rectum revealed no abnormalities. The     time to cecum =  3.5  minutes. The scope was then withdrawn (time     =  18.5  min) from the patient and the procedure completed.          COMPLICATIONS:  None          ENDOSCOPIC IMPRESSION:     1) 4 - 10 mm, three polyps in the cecum     2) 5 mm sessile polyp in the ascending colon          RECOMMENDATIONS:     1) Hold aspirin, aspirin products, and anti-inflammatory     medication for 2 weeks.     2) Await pathology results     3) Repeat Colonoscopy in 3 years pending pathology review  Venita Lick. Russella Dar, MD, [facility]          CC:  Tracey Harries, MD          n.     Rosalie DoctorVenita Lick. Destanee Bedonie at 02/02/2011 10:48 AM          Lebron Quam, [id number]  Note: An exclamation mark (!) indicates a result that was not dispersed into the flowsheet. Document Creation Date: 02/02/2011 10:48 AM _______________________________________________________________________  (1) Order result status: Final Collection or observation date-time: 02/02/2011 10:35 Requested date-time:  Receipt date-time:  Reported date-time:  Referring Physician:   Ordering Physician: Claudette Head [phone]) Specimen Source:  Source: Launa Grill Order Number: [id number] Lab site:

## 2011-02-19 ENCOUNTER — Other Ambulatory Visit (INDEPENDENT_AMBULATORY_CARE_PROVIDER_SITE_OTHER): Payer: Medicare Other

## 2011-02-19 ENCOUNTER — Other Ambulatory Visit: Payer: Self-pay | Admitting: Internal Medicine

## 2011-02-19 DIAGNOSIS — Z0000 Encounter for general adult medical examination without abnormal findings: Secondary | ICD-10-CM

## 2011-02-19 DIAGNOSIS — I509 Heart failure, unspecified: Secondary | ICD-10-CM

## 2011-02-19 DIAGNOSIS — E785 Hyperlipidemia, unspecified: Secondary | ICD-10-CM

## 2011-02-26 ENCOUNTER — Telehealth: Payer: Self-pay | Admitting: Internal Medicine

## 2011-02-26 ENCOUNTER — Other Ambulatory Visit: Payer: Self-pay | Admitting: Internal Medicine

## 2011-02-26 MED ORDER — LOSARTAN POTASSIUM 100 MG PO TABS: 100.0000 mg | ORAL_TABLET | Freq: Every day | ORAL | Status: DC

## 2011-02-26 NOTE — Telephone Encounter (Signed)
Patient only got 15 pills last refill of  losartan - she wants 90 day supply - [facility] - [address]

## 2011-03-20 NOTE — Letter (Signed)
April 10, 2007    Madlyn Frankel. Charlann Boxer, M.D.  [facility]  [address]  [address]  [place], [place] [address]   RE:  [name], [name]  MRN:  [id number]  /  DOB:  [birth year]   Dear Dr. Charlann Boxer:   Thank you for referring Ms. Finder for preoperative clearance.  She  states that she is to have a total knee replacement on May 01, 2007.  She had a contralateral total knee replacement in October.  She states  she had no perioperative complications but is unsure whether or not she  received transfusions.  She does not know whether she is going to have  autologous transfusions available for this surgery.   She describes constant pain in the knee which limits activity.   She states that she is on no specific diet.  Her fasting blood sugars  have been less than 120-130.  She is not following post prandial sugars.  She denies polyuria, polyphagia, polydipsia, new paresthesias, or  neuropathic pain.  She denies chest pain or shortness of breath or other  cardiopulmonary symptoms; but she is essentially sedentary.   She does have some numbness in her hands in the morning which suggests  carpal tunnel.   She has occasional rectal bleeding from hemorrhoids.  She is due for a  followup colonoscopy in 2009.   PHYSICAL EXAMINATION:  VITAL SIGNS:  She has gained approximately 2  pounds to 269.  Pulse is 64, respiratory rate 18, and blood pressure  120/78.  GENERAL:  She is morbidly obese with the classic stigmata of metabolic  syndrome.  HEENT:  She has no icterus or jaundice.  She has no lymphadenopathy.  She has loud carotid bruits versus radiation of a grade I -1 1/2  systolic murmur.  LUNGS:  Breath sounds are clear with no increased work of breathing.  ABDOMEN:  Massive without organomegaly or tenderness.  EXTREMITIES:  Pedal pulses are intact.  She has trace edema.   The most striking finding is her limitation based on her body habitus  and her joint disease which mke it difficult for  her to even climb onto  the exam  table.   There appears to be an element of denial in relationship to dietary &  glucose monitor adherence.  I will perform a hemoglobin A1c,  BUN/creatinine, potassium, and liver function tests to assess her  diabetic control & perioperative risk. Because of the carpal tunnel  symptoms, I will  also perform free T4 and TSH.   She is at extremely high perioperative risk because of the diabetes,  deconditioning , & body habitus.   Because of her murmur, I would recommend that she take SBE prophylaxis  perioperatively and before any dental procedures.  She will discuss this  with her dentist, Dr. Bonna Gains.   Should medical problems arise perioperatively, she can be seen by the  [facility].    Sincerely,      Titus Dubin. Alwyn Ren, MD,FACP,FCCP  Electronically Signed    WFH/MedQ  DD: 04/10/2007  DT: 04/10/2007  Job #: [id number]

## 2011-03-20 NOTE — H&P (Signed)
[name], [name]                ACCOUNT NO.:  [id number]   MEDICAL RECORD NO.:  [id number]          PATIENT TYPE:  INP   LOCATION:  NA                           FACILITY:  [facility]   PHYSICIAN:  [name], M.D.  DATE OF BIRTH:  August 05, 1941   DATE OF ADMISSION:  05/01/2007  DATE OF DISCHARGE:                              HISTORY & PHYSICAL   PROCEDURE:  Right total knee arthroplasty.   CHIEF COMPLAINT:  Right knee pain.   HISTORY OF PRESENT ILLNESS:  This is a 70 year old female with a history  of persistent progressive right knee pain secondary to osteoarthritis.  She also has a history of left total knee replacement by Dr. Charlann Nunez in the  past.  She has been refractory to all conservative treatments for this  right knee.  She has been presurgically assessed by Dr. Titus Dubin.  Nunez who has assessed her to be at a high preop risk.   PAST MEDICAL HISTORY:  Past medical history is significant for:  1. Osteoarthritis.  2. Hypertension.  3. Diabetes.   PAST SURGICAL HISTORY:  1. Left knee replacement.  2. Hysterectomy.  3. Tubal ligation.   FAMILY HISTORY:  Heart disease, hypertension, diabetes, stroke.   SOCIAL HISTORY:  The patient is married.  Primary care giver after  surgery will be husband and children.   DRUG ALLERGIES:  No known drug allergies.   MEDICATIONS:  1. Glipizide 5 mg one p.o. daily.  2. Metformin 1000 mg one p.o. b.i.d.  3. Crestor 20 mg one daily  4. Actos 45 mg one p.o. daily.  5. Lisinopril 20 mg one p.o. daily.  6. Cozaar 50 mg one p.o. b.i.d.   REVIEW OF SYSTEMS:  GASTROINTESTINAL:  Hemorrhoids.  GENITOURINARY:  Problems with urinary incontinence.  GYNECOLOGICAL:  Menopausal.  Otherwise see history of present illness.   PHYSICAL EXAMINATION:  VITAL SIGNS:  Pulse 64, respirations 18, blood  pressure 144/82.  GENERAL:  She is awake, alert and oriented, well-developed, well-  nourished, in no acute distress.  NECK:  Neck is supple, no carotid  bruits.  CHEST:  Lungs are clear to auscultation.  BREAST:  Deferred.  HEART:  Heart has a regular rate and rhythm.  She has a distinct  systolic murmur.  S1, S2 are distinct.  ABDOMEN:  Abdomen soft, nontender, nondistended.  GENITOURINARY:  Deferred.  EXTREMITIES:  Right knee comes out to full extension, has diffused  tenderness.  SKIN:  No cellulitis.  No skin break-down.  No dystrophic nails.  NEUROLOGIC:  Intact distal sensibilities.   LABORATORY DATA:  Labs, EKG, chest x-ray pending.   IMPRESSION:  1. Osteoarthritis.  2. Diabetes.  3. Hypertension.  4. History of left total knee replacement.   PLAN OF ACTION:  Right total knee arthroplasty at [facility] on May 01, 2007, by Dr. Durene Nunez.  The risks and complications were  discussed.  Questions were encouraged, answered and reviewed.     ______________________________  [name], [place]      [name], M.D.  Electronically Signed    [name]  D:  04/25/2007  T:  04/25/2007  Job:  [id number]

## 2011-03-20 NOTE — Op Note (Signed)
[name], [name]                ACCOUNT NO.:  [id number]   MEDICAL RECORD NO.:  [id number]          PATIENT TYPE:  AMB   LOCATION:  DSC                          FACILITY:  [facility]   PHYSICIAN:  Ollen Gross. Vernell Morgans, M.D. DATE OF BIRTH:  December 03, 1940   DATE OF PROCEDURE:  08/31/2008  DATE OF DISCHARGE:                               OPERATIVE REPORT   PREOPERATIVE DIAGNOSIS:  Left breast ductal carcinoma in situ.   POSTOPERATIVE DIAGNOSIS:  Left breast ductal carcinoma in situ.   PROCEDURE:  Left breast needle-localized lumpectomy with placement of  MammoSite catheter.   SURGEON:  Ollen Gross. Vernell Morgans, MD   ANESTHESIA:  General via LMA.   PROCEDURE:  After informed consent was obtained, the patient was brought  to the operating room, and placed in supine position on the operating  table.  After adequate induction of general anesthesia, the patient's  left breast was prepped with Betadine and draped in usual sterile  manner.  Earlier in the day, the patient had undergone a wire  localization procedure and the wire was entering the breast laterally in  the lower outer quadrant and aimed towards the nipple and areola.  A  radial type incision was made between the nipple and the edge of the  areola and towards the entry site of the wire.  This radial incision was  made with #15 blade knife.  The incision was carried down through the  skin and subcutaneous tissue sharply with electrocautery until the  breast tissue was entered.  Laterally the dissection was carried down  until the wire was identified.  The path of the wire could then be  appreciated.  We then were able to excise a circular portion of breast  tissue around the path of the wire.  It appeared as though we may have,  as we were doing this came across the tumor.  At that point, we backed  up and took the dissection much more widely around that area and once  the specimen was completely removed, we then repaired the cleft with a  stitch sort of anteriorly to show the true dissection plane around the  mass.  Once the specimen was removed, it was then marked with paint  according to the assigned colors and this was all sent for specimen  radiograph which showed the clip and what appeared to be a mass in the  center of the specimen.  It was then sent to pathology for further  evaluation.  Hemostasis was achieved using the Bovie electrocautery.  Because we got close to the tumor cavity, we then took some additional  anterior and inferior and inferolateral margin where it seemed that we  were closest to this, these were all marked separately with a stitch on  the true surgical margin.  The wound was infiltrated with 0.25%  Marcaine.  The wound was then irrigated with copious amounts of saline.  The small incision was then made lateral to the operative bed on the  lateral portion of the breast.  The trocar was then placed through this  opening  directly straight into the middle of the cavity.  It was then  removed and then the cavity evaluation device was placed through this  opening and then into the cavity.  The deep layer of the wound was then  closed with interrupted 3-0 Vicryl stitches.  The skin was closed with a  running 4-0 Monocryl subcuticular stitch.  The catheter was then  anchored to the skin at about 10-1/2 cm with a 3-0 nylon stitch.  The  catheter was then filled with 70 mL of Hypaque saline solution and it  appeared to be in good position.  Ultrasound was performed  intraoperatively that showed the cavity, the skin distance to be at  least 16 mm, everything else looked good.  At this point, the Dermabond  was applied to the  breast incision and then once this was dry, sterile dressings were  placed around the catheter.  The patient tolerated the procedure well.  At the end of the case, all needle, sponge, instrument counts were  correct.  The patient was then awakened and taken to recovery room in   stable condition.      Ollen Gross. Vernell Morgans, M.D.  Electronically Signed     PST/MEDQ  D:  08/31/2008  T:  09/01/2008  Job:  [id number]

## 2011-03-20 NOTE — Assessment & Plan Note (Signed)
[facility]                            CARDIOLOGY OFFICE NOTE   NAME:[name], [name]                       MRN:          [id number]  DATE:09/08/2008                            DOB:          [birth year]    [name] returns today for followup.  She is a significantly overweight  female that I have been following for severe AS.  She is not an ideal  surgical candidate.  Her mean gradient has been 33, her peak gradient  was 49.  I reviewed her echo today and she continues to have a mean  gradients in the 40s with a peak velocity of about 4.  She has had 1  episode of what was thought to be congestive heart failure few months  back.  She is currently asymptomatic.  Unfortunately, she has developed  breast cancer.  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  She has had 2  surgeries by Dr. Carolynne Edouard.  I do not have any details; however, I was  somewhat concerned.  She apparently is planning to get external beam  radiation.  She had Dr. Rennie Plowman card with her.  My concern is that the  radiation would hasten the deterioration of her valve.   I talked to Dr. Roselind Messier today over at the [facility].  He assured me  that they could probably use the tomographic scanner to plan her  radiation to stay away from her aortic valve.  Her aortic valve was  already extensively calcified, and although they are unlikely to be  acute effects from the radiation, I think it would hasten the time to  aortic valve replacement.  The patient knows to make sure that when they  go to [place] for scanning that they know that she needs tomographic  planning to avoid her aortic valve.  Otherwise, she is doing well.  She  is not having PND or orthopnea.  She has some difficulty ambulating due  to chronic arthritis.  She has had trace lower extremity edema.  She has  no significant chest pain.   She had a heart catheterization done on October 03, 2007.  She had  moderate-to-severe mitral  regurgitation and severe aortic stenosis.  She  had no significant coronary artery disease and had normal LV function.  Her right heart pressures showed a wedge of 33 and a PA pressure of  51/29.   CURRENT MEDICATIONS:  1. Lasix 40 a day.  2. Potassium 20 a day.  3. Crestor 20 a day.  4. Glipizide 5 a day.  5. Metformin 1 g b.i.d.  6. Colace.  7. Lisinopril 40 a day.  8. Aspirin a day.   PHYSICAL EXAMINATION:  GENERAL:  Remarkable for an overweight [name]  female in no distress.  VITAL SIGNS:  Weight is 274, blood pressure is 130/80, pulse 60 and  regular, respiratory rate 14, afebrile.  HEENT:  Unremarkable.  Carotids have mild parvus et tardus with  transmitted murmur.  No lymphadenopathy, thyromegaly, or JVP elevation.  LUNGS:  Clear.  Good diaphragmatic motion.  No wheezing.  S1 and S2 is  preserved.  There is an AS murmur and a mild AI murmur.  PMI not  palpable.  ABDOMEN:  Benign.  Bowel sounds positive.  No AAA, no tenderness, no  bruit, no hepatosplenomegaly or hepatojugular reflux.  EXTREMITIES:  Distal pulses are intact.  No edema.  NEURO:  Nonfocal.  SKIN:  Warm and dry.  No muscular weakness.   IMPRESSION:  1. Severe aortic stenosis, currently asymptomatic.  Not an ideal      candidate for surgery, good left ventricular function with no      coronary disease.  There is no doubt that she will probably need      her heart valve replaced in the next 5 years, but currently she is      doing well.  2. Breast cancer.  Follow up with Dr. Dayton Scrape specifically.   PLAN:  1. Tomographic radiation port to not involved aortic valves.  2. High filling pressures with lower extremity edema.  Continue      current dose of Lasix and potassium.  3. 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      Continue current dose of oral hypoglycemics.  4. Hypertension, currently well controlled.  Continue lisinopril, low-      sodium diet.  5. Hyperlipidemia in the  setting of aortic valve disease.  Continue      statin therapy.  Lipid and liver profile in 6 months.   Considerable time was spent with Ms. Sieh today trying to allay her  fears regarding therapy for breast cancer.  I also personally called the  [facility] and talked to Dr. Roselind Messier.     Noralyn Pick. Eden Emms, MD, [facility]  Electronically Signed    PCN/MedQ  DD: 09/08/2008  DT: 09/09/2008  Job #: [id number]

## 2011-03-20 NOTE — Op Note (Signed)
[name], [name]                ACCOUNT NO.:  [id number]   MEDICAL RECORD NO.:  [id number]          PATIENT TYPE:  INP   LOCATION:  0003                         FACILITY:  [facility]   PHYSICIAN:  Madlyn Frankel. Charlann Boxer, M.D.  DATE OF BIRTH:  Aug 20, 1941   DATE OF PROCEDURE:  05/01/2007  DATE OF DISCHARGE:                               OPERATIVE REPORT   PREOPERATIVE DIAGNOSIS:  Right knee osteoarthritis, history of left  total knee replacement.   POSTOPERATIVE DIAGNOSIS:  Right knee osteoarthritis, history of left  total knee replacement.   PROCEDURE:  Right total knee replacement.   COMPONENTS USED:  A Pugh rotating platform posterior stabilized knee  system with a size 2.5 femur, 2.5 tibia, 15-mm insert, 35 patellar  button.   SURGEON:  Madlyn Frankel. Charlann Boxer, M.D.   ASSISTANT:  Surgical technician.   ANESTHESIA:  Dermal, spinal.   COMPLICATIONS:  None.   DRAINS:  None.   TOURNIQUET TIME:  51 minutes at 300 mmHg.   INDICATIONS FOR PROCEDURE:  [name] is a 70 year old female followed  for bilateral knee osteoarthritis.  She had failed conservative measures  and has already had her left knee replaced.  She has done very well with  this and wished to proceed with her right knee.  Risks, benefits were  reviewed.  Consent was obtained.   PROCEDURE IN DETAIL:  The patient was brought to the operative theater.  Once adequate anesthesia and preoperative antibiotics, 2 grams of Ancef,  were administered the patient was positioned supine.  Proximal thigh  tourniquet was placed.  The right lower extremity was then prescrubbed  and prepped and draped in sterile fashion.  The leg was elevated,  exsanguinated, tourniquet elevated.  Midline incision was made followed  by median parapatellar arthrotomy that was modified with patella  subluxation and eversion.  Following exposure, attention was first  directed to the patella where precut measurement was 24 mm.  I resected  down to 14 mm and used  a 35 patellar button as I had done on the  contralateral knee.  A metal shim was placed to protect the patella from  retractors.  At this point attention was directed to the femur.  The  canal was opened, irrigated to prevent fat emboli.  I then passed an  intramedullary rod and cut the distal femur at 5 degrees of valgus  removing 10 mm of bone end.  I then sized the femur and felt the femur  was a size 2.5 at this time as opposed to a 3, the contralateral knee.   Then anterior, posterior and chamfer cuts were made off the posterior  reference which was perpendicular to the [employer].   The final box cut was made based off the lateral aspect of distal femur  all to help with patella tracking.  At this point attention was directed  to the tibia.  Tibial subluxation was carried out.  An extramedullary  guide was placed.  A perpendicular cut was then made resecting 10 mm of  bone off the lateral proximal tibia.  I did this as  it appeared that the  cut was going to be too big with a 10-mm resection.  Following this cut  I checked with the tibial tray and the 2.5 tibial tray fit best.  I  looked at the alignment rod and found that the cut did pass through the  ankle in both planes perpendicular.  I then drilled and keel punched the  tibia and did a trial reduction.   The knee came out at least to full extension with a 12.5 with a 15-mm  insert, and felt the knee was more stable from extension and flexion and  came out to full extension.   The patella was noted to have a little bit of lift-off.  I spent some  time removing some of the extra synovial tissue outside the joint, as  well as debriding some of the retinacular tissue off the lateral aspect  of the patella.  This help the patella fit more stable.   At this point all trial components were removed, the knee was irrigated  with pulse lavage normal saline solution.  I injected the knee was 6 mL  of 0.25% Marcaine with  epinephrine and 1 mL of Toradol.  Cement was  mixed and final components were cemented into position.  Extruded cement  was removed using Personal assistant.   Once the cement had cured, excess was debrided throughout the knee,  particularly posteriorly.   I injected a few cubic centimeters of FloSeal into the posterior  capsular tissue and placed the final 15 x 2.5 insert.   The knee was then irrigated again lightly.  The remainder of FloSeal was  placed into the medial and lateral gutters.  I then brought the knee to  flexion, closed the extensor mechanism using #1 Vicryl.  The remainder  of the wound was closed in layers with 4-0 running Monocryl.  The skin  was cleaned, dried and dressed sterilely with Steri-Strips and a bulky  sterile dressing.  She was brought to the recovery room stable.      Madlyn Frankel Charlann Boxer, M.D.  Electronically Signed     MDO/MEDQ  D:  05/01/2007  T:  05/01/2007  Job:  [id number]

## 2011-03-20 NOTE — Discharge Summary (Signed)
Jennifer, Nunez                ACCOUNT NO.:  [id number]   MEDICAL RECORD NO.:  [id number]          PATIENT TYPE:  INP   LOCATION:  1619                         FACILITY:  [facility]   PHYSICIAN:  Madlyn Frankel. Charlann Boxer, M.D.  DATE OF BIRTH:  [birth year]   DATE OF ADMISSION:  05/01/2007  DATE OF DISCHARGE:  05/04/2007                               DISCHARGE SUMMARY   ADMITTING DIAGNOSES:  1. Osteoarthritis.  2. Hypertension.  3. Diabetes.   DISCHARGE DIAGNOSES:  1. 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  2. Hypertension.  3. Diabetes.   CONSULTATION:  None.   PROCEDURE:  Right total knee arthroplasty by surgeon Dr. Durene Romans;  assistant, surgical tech.   HISTORY OF PRESENT ILLNESS:  A 70 year old female with a history of  persistent and progressive right knee pain secondary to osteoarthritis.  She also has a history of left total knee replacement in the past by Dr.  Charlann Boxer.  She was presurgically assessed by Dr. Marga Melnick.   LABS:  Preoperative CBC, hematocrit 32.9.  Subsequent tracking upon  discharge, she was stable with a  hematocrit of 27.3.  White cell  differential within normal limits.  Coagulation upon admission 1.0, INR  all within normal.  Preadmission coags, her chemistry all within normal  limits.  Postop day #1, a little bit of hyponatremia at 130, glucose  126.  Upon discharge, sodium 129, glucose 126.  Kidney function remained  stable.  Her calcium at discharge was 8.5.  GI workup all within normal  limits.  UA negative upon admission,  retaken postoperatively and showed  a moderate amount of leukocyte esterase.   CARDIOLOGY:  EKG showed normal sinus rhythm.   HOSPITAL COURSE:  The patient underwent right total knee replacement,  tolerated the procedure well and was admitted to the orthopedic floor.  The pain was well-controlled throughout.  She remained neurovascularly  intact to the right lower extremity throughout.  PT/OT was begun on  postop day #1. She progressed nicely and at the time of discharge was  able to ambulate at least 50 feet using a rolling walker.  She remained  afebrile throughout.  Celebrex was started on postop day #1 and gave her  a little bit of Ambien for sleep.  Developed a little bit of a cough but  otherwise stable.  PT/OT evaluation recommended home health care.  Postop day #3 doing well. Low grade fever 100.3 on the last evening  however, resolved.  Home health care was selected.  She was ready for  discharge on postop day #3.   DISCHARGE DISPOSITION:  Discharged to home health care physical therapy  stable and improved condition.   DISCHARGE PHYSICAL THERAPY:  She is weightbearing as tolerated with the  use of a rolling walker for 2 weeks and then transition to a single-  point cane.   GOALS OF PHYSICAL THERAPY:  Will work on gait training, proprioception,  minimize pain, maximize strength, increase range of motion, encourage  independence with  activities of daily living.  She will transition from  rolling walker to a single-point cane  in 2 weeks.   DISCHARGE FOLLOWUP:  1. Follow with Dr. Charlann Boxer at [phone] in 2 weeks.  2. If the patient develops shortness of breath or severe calf pain      call emergency services.   DISCHARGE MEDICATIONS:  1. Vicodin 5/325 one to two p.o. q. 4-6.  2. Robaxin 500 mg one p.o. q.6.  3. Lovenox 30 mg one subcu q.12.  4. Colace 100 mg b.i.d.  5. Iron 325 mg one p.o. t.i.d.   HOME MEDICATIONS:  1. Metformin 1000 mg one at breakfast and one at lunch.  2. Lisinopril 20 mg one at lunch.  3. Cozaar 50 mg one in morning and lunchtime.  4. Actos 45 mg 1 q.a.m.  5. Glipizide 5 mg 1 q.a.m.  6. Crestor 20 mg one q.h.s.  7. Calcium, magnesium, zinc one in the evening.  8. Chromium picolinate 200 mcg one in the morning.  9. Multivitamin one a day.  10.Fish oil 600 mg one daily.  11.Vitamin E one daily.  12.Aleve p.r.n.  13.Tylenol arthritis p.r.n.   14.Celebrex 200 mg 2 daily.   DISCHARGE WOUND CARE:  Keep wound dry, change dressing on daily basis.     ______________________________  Yetta Glassman [address], [place]      Madlyn Frankel. Charlann Boxer, M.D.  Electronically Signed    BLM/MEDQ  D:  05/16/2007  T:  05/17/2007  Job:  [id number]   cc:   Titus Dubin. Alwyn Ren, MD,FACP,FCCP  [phone] [place]  [place] [address]

## 2011-03-20 NOTE — Discharge Summary (Signed)
[name], [name]                ACCOUNT NO.:  [id number]   MEDICAL RECORD NO.:  [id number]          PATIENT TYPE:  INP   LOCATION:  1402                         FACILITY:  [facility]   PHYSICIAN:  Jennifer Pick. Eden Emms, MD, FACCDATE OF BIRTH:  [birth year]   DATE OF ADMISSION:  10/01/2007  DATE OF DISCHARGE:  10/03/2007                               DISCHARGE SUMMARY   PROCEDURES:  1. Right heart catheterization.  2. Left heart catheterization.  3. Coronary arteriogram  4. Left ventriculogram.  5. 2-D echocardiogram.   PRIMARY FINAL DISCHARGE DIAGNOSIS:  Acute diastolic congestive heart  failure.   SECONDARY DIAGNOSIS:  1. Moderate aortic valve stenosis with a mean gradient 33 mmHg and      estimated aortic valve area by VPI 0.6 cm2, mitral mean transmitral      gradient 4 mm and mitral valve area by pressure half-time 3.06 cm2,      EF 55% and PA of 37 at echo.  2. Diabetes.  3. Hypertension.  4. Hyperlipidemia.  5. Family history of coronary artery disease.  6. Morbid obesity.  7. Osteoarthritis.  8. Gastroesophageal reflux disease.  9. Status post bilateral total knee replacement as well as      hysterectomy, bilateral tubal ligation, hernia repair and      colonoscopy.   TIME AT DISCHARGE:  35 minutes.   HOSPITAL COURSE:  [name] is a 70 year old female who had been cathed  5-6 years ago with no significant coronary artery disease.  She had  increased dyspnea and weight gain and came to the emergency room where  she was diagnosed with pulmonary edema and admitted for further  evaluation.   Her cardiac enzymes were negative for MI.  A right and left heart  catheterization was performed to help evaluate the heart failure.  She  had an echocardiogram as well with the results described above.  The  cardiac catheterization showed an EF of greater than 50%, although she  possibly had some mild hypokinesis at the inferior base.  She had no  significant coronary artery disease.   Her PA pressures were 51/29 with a  wedge of 33.   Dr. Eden Nunez evaluated the echocardiogram and the cath lab data.  He felt  that she would need an aortic valve replacement at some time in the  future but not right now.  An October 05, 2007 chest x-ray showed that  her effusions had resolved.  Her respiratory status was much improved  and she was maintaining O2 saturations of 97% on room air.  Her kidney  function and potassium remained stable.  She had some hyponatremia with  a sodium 131, but her BUN was 40, creatinine 0.87 after diuresis.  Dr.  Eden Nunez evaluated [name] on October 05, 2007 and felt that she could  be discharged safely home and follow up as an outpatient.  Of note, she  is to follow up with Dr. Alwyn Ren as Dr. Eden Nunez feels that she should be  taken off the Actos, but is to continue this until she sees Dr. Alwyn Ren  for better blood  sugar control.   DISCHARGE INSTRUCTIONS:  1. Her activity level is supposed to be increased gradually.  2. She is to weigh herself daily.  3. She is to stick to a low-fat diabetic diet.  4. She is to follow up with Dr. Alwyn Ren and with Dr. Eden Nunez.   DISCHARGE MEDICATIONS:  1. Furosemide 40 mg daily.  2. K-Dur 20 mEq daily.  3. Actos 45 mg daily.  4. Crestor 20 mg daily.  5. Cozaar 50 mg daily.  6. Glipizide XL 5 mg daily.  7. Metformin 1000 mg b.i.d.  8. Colace.  9. MiraLax as prior to admission.      Theodore Demark, PA-C      Jennifer Pick. Eden Emms, MD, [facility]  Electronically Signed    RB/MEDQ  D:  10/05/2007  T:  10/05/2007  Job:  [id number]   cc:   Peyton Najjar, MD

## 2011-03-20 NOTE — Assessment & Plan Note (Signed)
[facility]                            CARDIOLOGY OFFICE NOTE   NAME:Nunez Nunez ZEIS                       MRN:          [id number]  DATE:10/13/2007                            DOB:          01/23/1941    Ms. Nunez Nunez is a 70 year old obese [place] woman, who is admitted  to the hospital with congestive heart failure and a right effusion.  She  cleared quickly with diuretics.  She had a heart catheterization, which  essentially showed no coronary disease.  She has moderate aortic  stenosis and mild aortic insufficiency.  Dr. Riley Kill felt that her  pressures were higher, and he was more concerned about needing an aortic  valve replacement at the time.  However, I clearly reviewed the  patient's catheterization and her echocardiogram.  Her mean gradient was  about 35 mmHg; this was her first episode of congestive heart failure.   I prefer to follow her at this time.  She needs to lose weight and be  more physically active.  She does water aerobics at [employer].  We will  try to get her involved in cardiac rehabilitation.  She does not have  concomittant coronary disease, and has good LV function.   Her review of systems is otherwise negative.   MEDICATIONS:  She has been compliant with her medications.  She is  currently on:  1. Lasix 40 mg daily.  2. K-Dur 20 mEq daily.  Fortunately for [place], her Actos has been stopped since hospital discharge.  1. Crestor 20 mg daily.  2. Glipizide 5 mg daily.  3. Metformin 1 gram b.i.d.  4. Colace.  5. Miralax.  6. Cozaar 100 mg daily.   PHYSICAL EXAMINATION:  Remarkable for an obese middle-aged white female  in no distress.  Blood pressure 128/70, pulse 70 and regular, weight  271, respiratory rate 14, afebrile.  HEENT:  Unremarkable.  NECK:  Carotids have minimal parvus and tardus.  There is a transmitted  murmur.  No JVP elevation, no lymphadenopathy or thyromegaly.  LUNGS:  Clear to diaphragmatic  motion.  No wheezing, no residual right  effusion.  CARDIAC:  There is an S1.  The second heart sound is clearly preserved,  indicating a lack of severe AS.  She has a mid-peaking AS murmur and a  soft diastolic AI murmur.  PMI is increased, but not displaced.  ABDOMEN:  Mobile.  Bowel sounds positive.  No AAA.  No bruit, no  hepatosplenomegaly.  Without rigidity or reflex.  No tenderness.  EXTREMITIES:  Distal pulses are intact.  No edema.  NEUROLOGIC:  Nonfocal.  No muscular weakness.   IMPRESSION:  1. Moderate Aortic Stenosis With Mild Aortic Regurgitation.  Follow-up      echocardiogram in 6 months.  SP prophylaxis.  The patient was given      a prescription for ampicillin 2 grams.  She has a cousin who is a      Education officer, community, she will get her teeth in good shape.  I suspect she will      need an aortic valve replacement sometime in  the next 3 years.  2. Recent Congestive Heart Failure.  Currently improved.  Continue      with current dose of Lasix and potassium.  Followup BNP and BMET in      3 months.  3. Previous Diabetes.  On Actos, which might have exacerbated her      congestive heart failure.  Current medication is stopped.  Continue      Glipizide and metformin; possibly add Januvia in the future.      Follow up with Dr. Alwyn Ren.  4. Hypertension.  Currently well controlled on Cozaar.  We will have      to be a little careful of this, due to her fixed aortic stenosis.      But clearly this will benefit her diabetes in terms of renal      protection.  5. Hyperlipidemia.  In the setting of aortic valve disease.  Continue      Crestor 20 mg daily.  This may slow down the progression of her      aortic stenosis.   Overall, Nunez Nunez is doing well.  I will see her back in 3 months.  We will  try to get her enrolled in the [facility] cardiac rehabilitation, as  well as water aerobics at [place].     Noralyn Pick. Eden Emms, MD, [facility]  Electronically Signed    PCN/MedQ  DD: 10/13/2007   DT: 10/14/2007  Job #: [id number]

## 2011-03-20 NOTE — Cardiovascular Report (Signed)
NAMEEILIANA, DRONE                ACCOUNT NO.:  [id number]   MEDICAL RECORD NO.:  [id number]           PATIENT TYPE:   LOCATION:                                 FACILITY:   PHYSICIAN:  Arturo Morton. Riley Kill, MD, FACCDATE OF BIRTH:  10-Aug-1941   DATE OF PROCEDURE:  DATE OF DISCHARGE:                            CARDIAC CATHETERIZATION   INDICATIONS:  The patient's main complaint is progressive shortness of  breath on exertion.  She has orthopnea but denies PND.  She had a recent  upper respiratory infection and was treated with a Z-Pak.  She has not  had chest pain.  She feels that her weight is increased about 15-20  pounds.  She has also been treated for diabetes and is on Actos.  Echocardiogram is compatible with at least moderate aortic valve  stenosis and a gradient of over 30 mm.  Current study was done to assess  coronary anatomy, and hemodynamics.   PROCEDURE:  1. Left and right heart catheterization  2. Selective coronary territory.  3. Selective left ventriculography.   DESCRIPTION OF PROCEDURE:  The patient was brought to the  catheterization laboratory and prepped and draped in the usual fashion  following informed consent.  Through an anterior puncture the femoral  vein was entered.  Following this, right heart catheterization was  performed without complication using a 7.5 French thermodilution Swan-  Ganz catheter.  Left heart catheterization was then performed using a 5-  French sheath, a 5-French pigtail.  Coronary arteriography was used  using standard Judkins catheters.  The patient tolerated the procedure  well and there were no complications.  All catheters were subsequently  removed.  She was taken to the holding area in satisfactory clinical  condition.   HEMODYNAMIC DATA.:  1. Right atrial pressure 12.  2. RV 451/12.  3. Pulmonary artery 51/29.  4. Pulmonary capillary wedge 33 with __________ to 45 or 50.  5. Aortic 142/65, mean 94.  6. LV 176/20.  7.  Aortic valve gradient approximately 35.5 mm.  8. Aortic valve area 1.2 sq cm.  9. Saturation of the aorta 98%.  10.Saturation PA 74%.  11.Saturations SVC 70%.   ANGIOGRAPHIC DATA:  1. Ventriculography in the RAO projection reveals mild inferobasal      hypokinesis.  There was at least moderate mitral regurgitation.      Ejection fraction would be estimated in the 55% range.  2. There was evidence of calcification of the aortic valve with      probable reduced excursion.  3. The left main was free of critical disease.  4. The left anterior descending artery demonstrates a small first      diagonal and then 2 second and third diagonals, with the third      being moderate in size.  The LAD and the diagonals has minimal      luminal irregularity but no critical stenoses.  5. The ramus intermedius is without critical disease.  6. The circumflex is a dominant vessel providing a large marginal, a      posterolateral and then a bifurcating posterior  descending system.      None of this demonstrates critical stenosis.  7. The right coronary artery is a nondominant vessel.   CONCLUSION:  1. Overall preserved left ventricular function with at least moderate      mitral regurgitation, and moderately severe to severe aortic valve      stenosis.  2. No critical coronary disease.   DISPOSITION:  The patient's hemodynamics are compatible with pulmonary  hypertension.  This may be from a combination.  At minimum of TEE would  be warranted.  Based on the results, further evaluation may be  necessary.  There is clearly a delayed upstroke in the aortic pressure  trace, and I am concerned about the severity of aortic stenosis given  the other findings.  Decisions will be deferred to Dr. Eden Emms.      Arturo Morton. Riley Kill, MD, [facility]  Electronically Signed     TDS/MEDQ  D:  10/03/2007  T:  10/03/2007  Job:  [id number]   cc:   Noralyn Pick. Eden Emms, MD, [facility]  Titus Dubin. Alwyn Ren, MD,FACP,FCCP

## 2011-03-20 NOTE — Assessment & Plan Note (Signed)
OFFICE VISIT   AVIELLA, DISBROW H  DOB:  06-Aug-1941                                        December 09, 2009  CHART #:  [id number]   The patient comes to the office today for postop visit after aortic  valve replacement done November 08, 2009.  At that time, she had a mitral  flow aortic pericardial tissue valve, 21-mm valve placed.  Her  postoperative course has been somewhat hampered by her weight of 276  pounds.  She had early postoperative fluid retention, which has improved  with increasing her Lasix dose and adding Zaroxolyn 3 times a week.  She  comes to the office today, notes that overall she feels better than she  did last week when her diuretics were increased, though she has had some  nasal congestion, drainage, and cough which she was seen by Primary Care  and started on a Z-Pak.  She denies any fevers or chills.  She is  increasing her physical activity appropriately.   On exam, her blood pressure 140/72, pulse is 74, respiratory rate is 18,  and O2 sats 97%.  She is afebrile.  On exam, her breath sounds are  somewhat distant, but without any active wheezing or rhonchi.  She does  have obvious nasal congestion with some drainage.  Cardiac exam reveals  regular rate and rhythm.  I do not appreciate any murmur of aortic  stenosis or aortic insufficiency.  She has no pedal edema.   A followup chest x-ray was done that shows clearing of the small left  pleural effusion that she had and clearing of the interstitial lung  markings consistent with improvement in her fluid overload.   She had an echocardiogram done this morning.  I do not have the full  report, but did discuss it with Dr. Eden Emms and was felt that initially  the velocity across the valve was still elevated.  There was no  perivalvular leak or obvious valve malfunction.   She continues on folic acid, which told her she could stop when the  prescription is filled.  She is on guaifenesin 600  twice a day, iron  supplementation which she will stop when its prescription is completed.  She is on lisinopril 20 mg once a day.  With her blood pressure being  slightly elevated today, I have returned her to her preop dose of 40 mg  of lisinopril once a day.  She continues on aspirin 81 mg a day, Colace  100 mg a day, Crestor 20 mg a day, Lasix 40 mg a day, metformin 1000 mg  twice a day, glyburide 5 mg a day, and potassium 40 mEq a day.  She is  also on Zaroxolyn 2.5 mg on Monday, Wednesdays, and Fridays.   Overall, she seems to be doing reasonably well considering her  significant obesity.  We will see if active treatment for fluid overload  and maximizing her blood pressure control, how she does with symptomatic-  wise.  I will plan to see her back in 1 month and in 2-3 months to  repeat the echocardiogram.   Sheliah Plane, MD  Electronically Signed   EG/MEDQ  D:  12/09/2009  T:  12/09/2009  Job:  [id number]   cc:   Noralyn Pick. Eden Emms, MD, [facility]  Marga Melnick, MD

## 2011-03-20 NOTE — Assessment & Plan Note (Signed)
OFFICE VISIT   [name], [name]  DOB:  November 27, 1940                                        January 05, 2010  CHART #:  [id number]   The patient returns to the office today in followup visit after her  aortic valve replacement with a pericardial tissue valve, 21-mm done  November 08, 2009.  The patient continues to improve.  She is now in  cardiac rehab and increasing her activity appropriately.  She denies any  shortness of breath.  She has had some left lower chest/rib pain when  she lays on her left side, not exercise related.  She has had no pedal  edema.   On examination, her blood pressure is 120/80 with a large cuff, pulse is  68 and regular, respiratory rate 20, and O2 sats 98% on room air.  Her  bowel sounds are crisp.  I do not appreciate any murmur of aortic  insufficiency.  She has early systolic murmur 2/6.  The lower  extremities are without any pedal edema.   Followup chest x-ray was done today that shows clearing of effusions and  resolution of interstitial edema noted on early postoperative films.  Overall, she seems to be making good progress, now interested in  increasing her activity at home and will continue with cardiac rehab.  With the lack of edema, I have decreased her Lasix dose to 40 mg once a  day and potassium 20 mEq once a day.  She continues on Zaroxolyn 2.5 mg  Monday, Wednesday, and Friday, Lasix 40 mg once a day, and potassium 20  mEq once a day.  Also on lisinopril 40 mg a day, aspirin 81 mg a day,  Crestor 20 mg a day, and glyburide 5 mg a day.   PLAN:  Overall, I am pleased with her progress especially over the last  3 weeks.  Her activity level has increased.  We will plan to see her  back in about 3 months and at that time discuss with Dr. Eden Emms a repeat  echocardiogram.   Sheliah Plane, MD  Electronically Signed   EG/MEDQ  D:  01/05/2010  T:  01/06/2010  Job:  [id number]   cc:   Noralyn Pick. Eden Emms, MD, [facility]

## 2011-03-20 NOTE — Assessment & Plan Note (Signed)
OFFICE VISIT   [name], [name]  DOB:  [birth year]                                        April 06, 2010  CHART #:  [id number]   HISTORY:  The patient comes into the office today in followup after her  aortic valve replacement with a tissue mitral flow pericardial tissue  valve November 08, 2009.  She has now completed cardiac rehab, still  limited by knee and back pain.  Denies any shortness of breath.  Denies  any overt symptoms of congestive heart failure.  She does continue on 40  mg of Lasix once a day.  Unfortunately, her weight continues to remain  high with a BMI of greater than 45.  She denies any pedal edema.  She  does note that in April, a repeat echocardiogram was performed.   PHYSICAL EXAMINATION:  Her blood pressure 142/71, pulse 64, respiratory  rate 20, and O2 sats 94%.  The sternum is stable and well healed.  Bowel  sounds are crisp.  I do not appreciate any murmur of aortic  insufficiency.  She has no pedal edema on exam today.   MEDICATIONS:  She continues on medications include:  1. Lisinopril 40 mg a day.  2. Aspirin 81 mg a day.  3. Colace.  4. Crestor 20 mg a day.  5. Lasix 40 mg a day.  6. Metformin 1000 twice a day.  7. Glyburide 5 mg a day.  8. Potassium 20 mEq a day.   DIAGNOSTIC TESTS:  Echocardiogram is reviewed and the patient's valve  area is estimated at 0.94, peak velocity across the valve was measured  356 cm/sec with a mean gradient 28 mm.  There is no evidence of aortic  insufficiency.   IMPRESSION:  Overall, the patient appears to be making reasonable  progress postoperatively without any overt symptoms of congestive heart  failure.  Obviously, her severe obesity with a BMI of greater than 45 is  major limiting her overall wellbeing.  She has been working on a regular  walking program and now that she has completed cardiac rehab.  I have  again discussed with her the importance of losing weight.   Sheliah Plane,  MD  Electronically Signed   EG/MEDQ  D:  04/06/2010  T:  04/06/2010  Job:  [id number]   cc:   Noralyn Pick. Eden Emms, MD, [facility]  Titus Dubin. Alwyn Ren, MD,FACP,FCCP

## 2011-03-20 NOTE — Op Note (Signed)
NAMEMILLEY, VINING                ACCOUNT NO.:  [id number]   MEDICAL RECORD NO.:  [id number]          PATIENT TYPE:  AMB   LOCATION:  DAY                          FACILITY:  [facility]   PHYSICIAN:  Ollen Gross. Vernell Morgans, M.D. DATE OF BIRTH:  1941-02-07   DATE OF PROCEDURE:  09/02/2008  DATE OF DISCHARGE:                               OPERATIVE REPORT   PREOPERATIVE DIAGNOSIS:  Left breast ductal carcinoma in situ.   POSTOPERATIVE DIAGNOSIS:  Left breast ductal carcinoma in situ.   PROCEDURE:  Left breast re-excision of inferior margin and replacement  of cavity evaluation device with MammoSite.   SURGEON:  Ollen Gross. Vernell Morgans, M.D.   ANESTHESIA:  General anesthesia via LMA.   DESCRIPTION OF PROCEDURE:  After an informed consent was obtained, the  patient was brought to the operating room and placed in the supine  position on the operating room table.  After administering general  anesthesia, the patient's left breast was prepped with Betadine and  draped in the usual sterile manner.  An incision was made along her old  incision and the stitches were divided.  This was done at the skin level  and then the subcutaneous tissue.  Once this was accomplished, we were  able to access the lumpectomy cavity.  We took the fluid out of the  cavity evaluation device balloon.  We then excised the entire inferior  margin of the cavity.  The first specimen was the inferior and lateral  portion.  It was marked with a short double stitch anterior, the long  double stitch medial and single stitch on the true surgical margin.  We  then took the inferior and medial portion of this cavity as well with a  separate specimen.  The stitch was on the true surgical margin.  All of  this had a very normal fatty breast tissue appearance.  Hemostasis was  then achieved using the Bovie electrocautery.  Next, the cavity  evaluation device was removed and the actual MammoSite balloon.  A large  5 to 6 cm sized balloon was  inserted back through the tract and into the  cavity.  A #3-0 nylon stitch was used to anchor the catheter and hold it  in place at about 10 to 10.5 cm from the skin.  The deep layer of the  wound was then closed with interrupted #3-0 Vicryl stitches and the skin  was closed with a running #4-0 Monocryl subcuticular stitch.  then 90 mL  of the Hypaque and saline solution were then injected into the balloon  of the catheter.  An ultrasound confirmed the skin to cavity distance at  about 1.2 cm.   She tolerated the operation well.  A Dermabond dressing and sterile  dressings were applied.  The patient tolerated the procedure well.  At  the end of the case all needle, sponge and instrument counts were  correct.  The patient was then awakened and taken to the recovery room  in stable condition.      Ollen Gross. Vernell Morgans, M.D.  Electronically Signed  PST/MEDQ  D:  09/02/2008  T:  09/02/2008  Job:  [id number]

## 2011-03-20 NOTE — Assessment & Plan Note (Signed)
[facility]                            CARDIOLOGY OFFICE NOTE   NAME:Nunez, Jennifer RACHELS                       MRN:          [id number]  DATE:02/16/2008                            DOB:          07/21/1941    [name] returns today for follow-up.  She has moderate aortic  stenosis.  She had an echo in November, when she was hospitalized with  question of congestive heart failure.  Her mean gradient was 33, her  peak gradient was 49.   The patient has normal LV function.  She was cathed.  She had a high  LVEDP, but had no significant coronary artery disease.   She has done well since hospital discharge.  [name] actually does very  well.  She goes to the club and uses the elliptical trainer and does  aerobic activity for 30-60 minutes 3 days a week.  She does this at 5:30  in the morning and then watches her three grandchildren.   She has not had any undue dyspnea.  She has had a couple of episodes of  breathlessness when she carries groceries, but these seem benign.  There  has been no syncope or chest pain.   She has mild chronic lower extremity edema which is stable.   Overall, she has done well.   The patient had her Actos stopped due to concerns about volume.  She  could not afford her Cozaar and had her lisinopril doubled.  Other than  this, her medications have not changed.   REVIEW OF SYSTEMS:  Negative.   She is on Lasix 40 a day, K-Dur 20 a day, Crestor 20 a day, glipizide 5  a day, metformin 1 gm b.i.d., Colace, MiraLax, lisinopril 40 a day, an  aspirin a day, fish oil.   PHYSICAL EXAMINATION:  GENERAL:  Remarkable for an overweight [place]  [place] female in no distress.  VITAL SIGNS:  Weight is 272.  Blood pressure is 135/76, pulse 62 and  regular, respiratory rate 18, afebrile.  HEENT:  Unremarkable.  NECK:  Carotids have a mild parvus et tardus.  There is a fairly loud  transmitted murmur to the carotids.  There is no  lymphadenopathy,  thyromegaly or JVP elevation.  LUNGS:  Clear with good diaphragmatic motion.  No wheezing.  CARDIOVASCULAR:  There is an S1.  The second heart sound is actually  quite loud and clearly preserved.  There is a moderate AS murmur and a  mild AI murmur.  PMI is normal.  ABDOMEN:  Benign.  Bowel sounds positive.  No AAA, no tenderness, no  bruit, no hepatosplenomegaly or hepatojugular reflux.  EXTREMITIES:  Distal pulses are intact with trace edema.  NEURO:  Nonfocal.  SKIN:  Warm and dry.  No muscle weakness.   IMPRESSION:  1. Aortic stenosis, moderate.  No indication for valve surgery.      Follow-up echo in November.  2. Lower extremity edema, stable.  Continue Lasix.  Consider checking      BMET and BNP in December.  3.  Diabetes, fairly well controlled.  Follow with Dr. Alwyn Ren.  Hemoglobin A1c quarterly.  3. Hypertension, currently well controlled.  Continue higher dose of      lisinopril.  The patient unable to afford Cozaar.  4. Hyperlipidemia in the setting of aortic stenosis.  Reasonable to      continue Crestor 20 mg a day.   Overall, I think [name] is doing well, and I will see her back in  November when she has her echo.     [name]. Eden Emms, MD, [facility]  Electronically Signed    PCN/MedQ  DD: 02/26/2008  DT: 02/26/2008  Job #: [id number]

## 2011-03-20 NOTE — H&P (Signed)
[name], KEPPLE                ACCOUNT NO.:  [id number]   MEDICAL RECORD NO.:  [id number]          PATIENT TYPE:  INP   LOCATION:  0105                         FACILITY:  [facility]   PHYSICIAN:  Noralyn Pick. Eden Emms, MD, FACCDATE OF BIRTH:  [birth year]   DATE OF ADMISSION:  10/01/2007  DATE OF DISCHARGE:                              HISTORY & PHYSICAL   PRIMARY CARE PHYSICIAN:  Titus Dubin. Alwyn Ren, MD,FACP,FCCP.   PRIMARY CARDIOLOGIST:  Everardo Beals. Juanda Chance, MD, [facility], who catheterized her  in 2002.   CHIEF COMPLAINT:  Shortness of breath.   HISTORY OF PRESENT ILLNESS:  [name] is a 70 year old female with no  critical coronary artery disease by catheterization in 2002.  She  reports a several-week history of increased dyspnea on exertion as well  as orthopnea, but denies PND.  Approximately 3 weeks ago, she had an  upper respiratory infection for which she went to Urgent Care and was  treated with a Z-PAK.  Her symptoms improved somewhat, but she still had  episodic worsening of her dyspnea on exertion which is also chronic, and  her orthopnea continued.  Today, she felt that her symptoms were worse  and was short of breath just getting dressed.  She went on to her  exercise class because she thought she would feel better, and instead  felt worse.  As she was more short of breath, she came to the emergency  room.  Here, she was diagnosed with pulmonary edema and given 80 mg of  Lasix, with significant diuresis and improvement in her respiratory  status.   Ms. Bohnet has not had chest pain.  She also has not had palpitations,  despite frequent PVCs and occasional bigeminy.  She does not know of any  edema.  However, she feels that her weight is increased about 15-20  pounds recently and is not sure why.  Her dyspnea on exertion has  limited her activity somewhat, but she is still taking care of her  grandchildren.  Currently on O2 and after approximately 2 liters of  diuresis, she is resting  comfortably.  She does, however, get short of  breath with conversation.   PAST MEDICAL HISTORY:  1. Diabetes.  2. Hyperlipidemia.  3. Hypertension.  4. Family history of coronary artery disease.  5. Morbid obesity.  6. Osteoarthritis.  7. Gastroesophageal reflux disease.  8. History of a recently diagnosed systolic ejection murmur.  9. Status post cardiac catheterization in 2002, with no significant      coronary artery disease and preserved left ventricular function.   SURGICAL HISTORY:  She is status post left total knee replacement in  2007 and right total knee replacement in July 2008.  She is also status  post hysterectomy, tubal ligation, hernia repair cardiac  catheterization, and colonoscopy.   ALLERGIES:  No known drug allergies.   CURRENT MEDICATIONS:  1. Cozaar 50 mg daily  2. Multivitamins plus vitamin E.  3. Aspirin 325 mg daily  4. Lisinopril 20 mg daily  5. Crestor 20 mg daily  6. Glipizide XL 5 mg daily  7.  Metformin 1000 mg b.i.d.  8. Fish oil.  9. Actos 45 mg daily  10.Chromium, calcium, and magnesium over the counter.  11.Promethazine with codeine p.r.n.   SOCIAL HISTORY:  She lives in [place] with her husband and has no  history of tobacco use.  She also denies alcohol or drug abuse.  She  tries to exercise but does not stick tightly to a diabetic diet.   FAMILY HISTORY:  Her mother died at age 53 of an aneurysm, and her  father died at age 18 of pneumonia.  One of her five siblings have  coronary artery disease.   REVIEW OF SYSTEMS:  Significant for a history of bright red blood per  rectum secondary to hemorrhoids.  She also has a history of stress  incontinence.  Weight gain and dyspnea, as described above.  She denies  palpitations.  She has chronic joint pains.  A full 14-point review of  systems is otherwise negative.   PHYSICAL EXAMINATION:  VITAL SIGNS: Temperature is 97.1, blood pressure  162/78, pulse 85, respiratory rate 28, O2  saturation 94% on 2 liters.  GENERAL:  She is a well-developed, morbidly obese white female who is  short of breath with talking.  HEENT: Normal.  NECK:  She has a murmur that radiates to both carotids.  No JVD is seen  secondary to body habitus.  CHEST:  She has decreased breath sounds, right greater than left bases,  but no crackles are noted.  CV:  Her heart is regular in rate and rhythm, with a 3/6 systolic  ejection murmur.  ABDOMEN:  Obese, soft, and nontender, with active bowel sounds.  EXTREMITIES:  1+ edema, with distal pulses intact, and no bruits noted.  SKIN:  No rashes or lesions are noted.  NEUROLOGIC:  She is alert and oriented.  Cranial nerves II-XII grossly  intact.  MUSCULOSKELETAL:  No joint deformity or effusions, and no spine or CVA  tenderness is noted.   LABORATORY VALUES:  BNP 122.  D-dimer 2.1.  Hemoglobin 10.3, hematocrit  31.1.  BUN 22, creatinine 0.58, glucose 221, potassium 4.8, but  hemolyzed.  MCV 87.   EKG:  Sinus rhythm, rate 84, with bigeminy, but no acute ischemic  changes.   CHEST X-RAY:  Symmetric pulmonary edema, with a right effusion and  possible right base infiltrate.   IMPRESSION:  1. Shortness of breath:  CT results will be followed up.  She will be      diuresed and will be started on anticoagulation if her CT is      positive for PE.  2. Cardiac issues:  We will cycle cardiac enzymes and check an      echocardiogram to follow up on her murmur which is by exam      consistent with aortic sclerosis/stenosis.  If stenosis as present,      by exam it is not severe.  We will also check a left lateral      decubitus chest x-ray to see if she has a layering effusion.  She      will be scheduled      for right and left heart catheterization on Friday, but rescheduled      if she is not able to tolerate the procedure.  She will be      continued on her other home medications.  Her BUN, creatinine, and      potassium levels will be followed  closely.      Bjorn Loser  Barrett, PA-C      Peter C. Eden Emms, MD, [facility]  Electronically Signed    RB/MEDQ  D:  10/01/2007  T:  10/01/2007  Job:  [id number]   cc:   Titus Dubin. Alwyn Ren, MD,FACP,FCCP  [phone] [place]  [place] [address]

## 2011-03-23 NOTE — H&P (Signed)
[name], [name]                ACCOUNT NO.:  [id number]   MEDICAL RECORD NO.:  [id number]          PATIENT TYPE:  INP   LOCATION:  NA                           FACILITY:  [facility]   PHYSICIAN:  Madlyn Frankel. Charlann Boxer, M.D.  DATE OF BIRTH:  09-13-1941   DATE OF ADMISSION:  08/27/2006  DATE OF DISCHARGE:                                HISTORY & PHYSICAL   Date of procedure will be on August 27, 2006 at [facility] for a  left total knee arthroplasty.   CHIEF COMPLAINT:  Left knee pain.   HISTORY OF PRESENT ILLNESS:  This is a 70 year old female who has a history  of chronic knee pain.  She has tried conservative measures but continues to  have bilateral knee pain.  She shows degenerative changes on x-ray, has had  cortisone injections in the past, which have not provided any longstanding  relief.  She states that the knee hurts her all of the time, even at night  it will wake her up.  Her gait is significantly altered and due to failed  conservative measures has elected to go ahead with total knee replacement.   PAST MEDICAL HISTORY:  Hypertension, diabetes, and osteoarthritis.   FAMILY HISTORY:  Significant for coronary artery disease, hypertension,  diabetes, and stroke.   SOCIAL HISTORY:  She is a nonsmoker.  Lives at home with husband and  daughter, who will be primary caregivers afterwards.   ALLERGIES:  No known drug allergies.  No known food allergies.  No latex  allergies.   MEDICATIONS:  Include Arthrotec, Actos, glipizide, lisinopril, Crestor,  Cozaar, Metformin.  She takes a multivitamins, fish oil, vitamin E, chromium  picolinate, calcium, magnesium, aspirin 81 mg, Senokot, and Tylenol  Arthritis.   REVIEW OF SYSTEMS:  She has no new cardiovascular, pulmonary, abdominal,  genitourinary, gastrointestinal, or neurological signs or symptoms.   PHYSICAL EXAMINATION:  VITAL SIGNS:  Temperature 98.3, pulse 64,  respirations 18, blood pressure 146/66.  GENERAL:   This is a well-developed, well-nourished, obese female who is  awake, alert and oriented, answered all questions appropriately.  NECK:  Supple.  No lymphadenopathy.  No carotid bruits noted.  CHEST:  Lungs are clear to auscultation bilaterally.  BREASTS:  Deferred.  HEART:  Regular rate and rhythm without gallops, clicks, or rubs.  Do note a  systolic murmur.  ABDOMEN:  Obese, nontender.  Bowel sounds present in all four quadrants.  GENITOURINARY:  Deferred.  EXTREMITIES:  Left knee is tender medially and laterally.  She has near full  extension from 0-100 but she does have some noted crepitation.  SKIN:  She has no rashes, no skin breakdown.  She is dorsalis pedis  positive.  NEUROLOGIC:  She is intact distally.   Labs were performed at [facility], awaiting them to be sent to our office.   X-rays have been taken of the left knee, AP and lateral.   IMPRESSION:  Left knee osteoarthritis.   PLAN OF ACTION:  Left total knee arthroplasty to be performed by surgeon,  Dr. Durene Romans.  All risks and  complications were discussed.  Questions  were encouraged, answered, and reviewed to the patient's satisfaction.  We  look forward to treating [name].     ______________________________  Yetta Glassman. [address], [place]      Madlyn Frankel. Charlann Boxer, M.D.  Electronically Signed    BLM/MEDQ  D:  08/23/2006  T:  08/24/2006  Job:  [id number]

## 2011-03-23 NOTE — Op Note (Signed)
[facility]  Patient:    [name], [name] Visit Number: [id number] MRN: [id number]          Service Type: DSU Location: DAY Attending Physician:  Malon Kindle Dictated by:   Malachi Pro. Ambrose Mantle, M.D. Proc. Date: 03/27/02 Admit Date:  03/27/2002 Discharge Date: 03/27/2002                             Operative Report  PREOPERATIVE DIAGNOSES: 1. Postmenopausal bleeding. 2. Endometrial polyp. 3. Degenerated endometrial polypoid tissue at the time of an endometrial    biopsy.  POSTOPERATIVE DIAGNOSES: 1. Postmenopausal bleeding. 2. Endometrial polyp. 3. Degenerated endometrial polypoid tissue at the time of an endometrial    biopsy.  OPERATION:  D&C hysteroscopy.  SURGEON:  Malachi Pro. Ambrose Mantle, M.D.  ANESTHESIA:  General.  DESCRIPTION OF PROCEDURE:  The patient was brought to the operating room and placed under satisfactory general anesthesia with a mask.  So, instead of being a true general, it was more of a sedation with mask.  She was placed in the lithotomy position.  The vulva, vagina, and urethra were prepped with Betadine solution.  The cervix was drawn into the operative field after the area was draped as a sterile field and with a pouch that would collect the sorbitol.  The uterus sounded to four inches slightly to the right and slightly anteriorly.  The cervical canal was dilated so that it would easily admit the hysteroscope.  I placed the hysteroscope into the cervical canal and immediately ran into some polyps in the cervical canal.  I tried to remove these with the hysteroscope biopsy instrument, but it would have taken too long, so I proceeded to use the polyp forceps to remove these.  I then replaced the hysteroscope and I could not get a good view of the endometrial cavity because of tissue present and because of bleeding.  I then removed a large amount of polypoid tissue, mainly from the right uterine fundus.  At one point  with the hysteroscope, I did see some tissue that appeared yellow.  It did not appear to be attached to anything.  It could have been omentum, but I do not think it was omentum.  I think it was degenerated tissue because it was not attached in any way, and I never felt that the instrument went through the endometrial fundus or the uterine fundus, but I never could see with the hysteroscope to the uterine fundus.  At any rate, I continued to remove polypoid tissue from mainly the right uterine fundus, and after I felt like I had pretty much completely removed it, and could not see with the hysteroscope, I did a D&C, producing very little additional tissue.  The procedure was then terminated.  Blood loss was about 10 cc of the deficit was 190 cc, but there was a considerable amount of sorbitol on the floor and on the drape, so I feel like the deficit was almost zero.  The patient was returned to satisfactory condition. Dictated by:   Malachi Pro. Ambrose Mantle, M.D. Attending Physician:  Malon Kindle DD:  03/27/02 TD:  03/31/02 Job: [id number]

## 2011-03-23 NOTE — Op Note (Signed)
NAMELOYD, SALVADOR                ACCOUNT NO.:  [id number]   MEDICAL RECORD NO.:  [id number]          PATIENT TYPE:  INP   LOCATION:  2899                         FACILITY:  [facility]   PHYSICIAN:  Madlyn Frankel. Charlann Boxer, M.D.  DATE OF BIRTH:  1941-01-19   DATE OF PROCEDURE:  08/27/2006  DATE OF DISCHARGE:                                 OPERATIVE REPORT   PREOPERATIVE DIAGNOSIS:  Left knee end-stage osteoarthritis.   POSTOPERATIVE DIAGNOSIS:  Left knee end-stage osteoarthritis.   PROCEDURE:  Left total knee replacement.   COMPONENTS USED:  DePuy rotating platform posterior stabilized knee system,  with a size 3 femur, 3 tibia, 15 mm posterior stabilized insert, and a 35  patellar button.   SURGEON:  Madlyn Frankel. Charlann Boxer, M.D.   ASSISTANT:  Yetta Glassman. Loreta Ave, PA-C.   ANESTHESIA:  General plus regional femoral nerve block.   DRAINS:  x1.   COMPLICATIONS:  None.   TOURNIQUET TIME:  35 minutes at 350 mmHg.   BLOOD LOSS:  200 cc.   INDICATIONS FOR PROCEDURE:  Ms. 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  After reviewing with her  the risk and benefits of surgical options, she wished to proceed with more  definitive measures of total knee replacement surgery.  I reviewed these  risks and benefits with her, including infection, DVT, and need for revision  surgery.   Consent was obtained.   PROCEDURE IN DETAIL:  The patient was brought to the operative theater.  Once adequate anesthesia and preoperative antibiotics, 2 g of Ancef, were  administered, the patient was positioned supine with a left proximal thigh  tourniquet placed.  The left lower extremity was then prepped and draped in  sterile fashion.  A midline incision was made, followed by a median  parapatellar arthrotomy.  Knee exposure was obtained.  The patient's tissues  were noted to be rather thick and  adhesive, so debridement and setting up  the knee surgery took some time.  We also had problems with the tourniquet,  so the tourniquet was let down for the vast majority of the case due to the  venous tourniquet situation.  Nonetheless, once exposure was obtained,  attention was first directed to the femur.  Femoral canal was opened and  irrigated to prevent fat emboli.  The intramedullary rod was then  positioned, and at 5 degrees of valgus 10 mm of bone was resected off the  medial femoral condyle.  There was noted to be pretty significant femoral  defect due to this AVN.  The cut defect at 10 mm did not require any  augmentation distally.   I then sized the femur to be a size three. The anterior, posterior, and  chamfer cuts were then made.  The rotation was set off the perpendicular to  Whiteside's line.   Following this, I went ahead and made the box cut on the lateral aspect of  the distal femur.   Following these preparations, attention  was directed to the tibia.  Tibial  cut was then made based off 10 mm of the lateral tibial plateau surface.  The patient's medial proximal tibia was noted be pretty significantly  deformed due to her degenerative changes and perhaps some other underlying  complication.  There was sclerotic bone medially.   Following this cut, I checked with a spacer block, and that at least is  enough space for a 12.5 spacer.  With this, I went ahead and exposed the  tibia, finished up the tibial preparation using a size 3 tibial tray.  It  was drilled and keel punched with the rotation, and it was set. Alignment  rod was passed and passed to the center of the ankle in the AP and lateral  planes.   At this point, the trial reduction was carried out with a size 3 femur, 3  tibia.  Initially, I used a 12.5 spacer. The knee came out to extension and  was stable in flexion.  I did still have significant debridements in the  posterior aspect of the knee to carry  out, and so I held off the option of  having to use a 15 insert.   Given this, the trial component was then placed.  Attention was directed to  the patella. Patellar debridement in the suprapatellar and infrapatellar  region was carried out.  I then measured the patella to be about 24 mm  thick.  I then resected down to 15 mm and chose to use a 35 patellar button  positioned in the superior medial aspect of the patella.   The drill holes were made. The patella tracked without application of  pressure, though at full flexion there was evidence of some lift-off perhaps  related to her soft tissues posteriorly.   Nevertheless, I was happy with the thickness of the cut, as a caliper used  to indicate preserved patellar height.   Attention was then redirected back to the joint.  The trial components were  removed. Laminar spreaders were placed on the cut surface of the tibia to  allow for exposure of the posterior aspect of the knee.  Radiographs had  indicated large posterior osteophytes. Both were debrided out the medial and  lateral aspects of the joint.  Following this debridement with a curette and  rongeur, I irrigated the knee out with almost a liter of pulse lavage normal  saline solution.   At this point, I did re-exsanguinate the leg and elevated the tourniquet to  350 mmHg pressure.  At this point, I was able to get a relatively dry knee.   The cement was mixed, and final components were brought into the field.  I  injected the knee with 60 cc of Marcaine with epinephrine and 1 cc of  Toradol.   The knee was dried. Final components were cemented into position, starting  with the tibia, then the femur and the patella.  I used a 12.5 insert to  bring the knee out to extension and allowed the cement to cure.  Excessive  cement was debrided while this was going on, and then once it had cured I removed the posterior cement and debris.  Once I was satisfied that all  cement had  been removed, I did trial with a 15 mm insert.  I felt the knee  came out to extension without any issue and flexed with very good stability.  With this, the final 15 mm posterior stabilized insert was brought  to the  field and placed onto a clean and prepared tibial surface.   The knee was reduced, and the knee was again irrigated.  A medium Hemovac  drain was placed deep.   I then reapproximated the extensor mechanism in flexion with #1 Vicryl.  The  rest of the wound was closed in layers with 2-0 Vicryl and staples on the  skin.  The skin was cleaned, dressed sterilely with an Adaptic, dressing  sponges, ABDs, and sterile bulky wrap.  The patient was then brought to the  recovery room and extubated in stable condition.      Madlyn Frankel Charlann Boxer, M.D.  Electronically Signed     MDO/MEDQ  D:  08/27/2006  T:  08/28/2006  Job:  [id number]

## 2011-03-23 NOTE — Discharge Summary (Signed)
[facility]  Patient:    [name], [name] Visit Number: [id number] MRN: [id number]          Service Type: GYN Location: 4W 0455 01 Attending Physician:  Malon Kindle Dictated by:   Malachi Pro. Ambrose Mantle, M.D. Admit Date:  05/13/2002 Discharge Date: 05/15/2002                             Discharge Summary  HISTORY OF PRESENT ILLNESS: This is a 70 year old white female, with atypical, complex endometrial hyperplasia, admitted for abdominal hysterectomy and bilateral salpingo-oophorectomy.  HOSPITAL COURSE: The patient underwent that procedure by  Dr. Ambrose Mantle with Dr. Jackelyn Knife assisting under general anesthesia with blood loss of about 100 cc. Postoperatively, the patient did well. Her capillary blood glucoses remained pretty much normal. Her hematocrit dropped from 36 to 31. She passed flatus, had a bowel movement and late on the afternoon of the second postoperative day she was ready for discharge.  LABORATORY DATA: Initial hemoglobin 12.5, hematocrit 36.6, white count 9000, platelet count 262,000. Followup hematocrits were 34.6 and 31.5. Comprehensive metabolic profile was normal except for a glucose of 142. There was 60 segs, 33 lymphs, 6 monos, and 2 eosinophils. Urinalysis was negative.  Pathology report showed uterus, ovaries, and fallopian tubes with benign cervix with microglandular hyperplasia, no dysplasia or malignancy. Endometrium with complex endometrial hyperplasia with focal atypia. Leiomyoma intramural. Right and left ovaries benign. No endometriosis or malignancy identified and benign right and left fallopian tubes.  Chest x-ray showed suboptimal inspiration but no active disease. ECG showed nonspecific T wave abnormality.  FINAL DIAGNOSES: Complex atypical endometrial hyperplasia, leiomyoma, diabetes, hypertension, and hypercholesterolemia.  OPERATION: Abdominal hysterectomy, bilateral salpingo-oophorectomy.  FINAL  CONDITION: Improved.  INSTRUCTIONS: Include diabetic diet, no vaginal entrance, no heavy lifting or strenuous activity. Call with any fever about 100.4 degrees, call with any heavy vaginal bleeding or incisional drainage. Return to the office in 4-5 days for followup examination and staple removal. She is to taken Aleve for pain. She declines Demerol at discharge. Dictated by:   Malachi Pro. Ambrose Mantle, M.D. Attending Physician:  Malon Kindle DD:  05/15/02 TD:  05/19/02 Job: [id number]

## 2011-03-23 NOTE — H&P (Signed)
[facility]  Patient:    [name], [name] Visit Number: [id number] MRN: [id number]          Service Type: DSU Location: DAY Attending Physician:  Malon Kindle Dictated by:   Malachi Pro. Ambrose Mantle, M.D. Admit Date:  03/27/2002 Discharge Date: 03/27/2002                           History and Physical  HISTORY OF PRESENT ILLNESS:  This is a 70 year old, white, married female, para 4-0-1-4, who is admitted to the hospital for abdominal hysterectomy, bilateral salpingo-oophorectomy because of atypical complex endometrial hyperplasia on D&C.  Last menstrual period, November 2001.  This patient, after one and a half years without bleeding, began bleeding again in May 2003.  The bleeding began on Mar 12, 2002, and persisted until her exam on Mar 17, 2002.  An endometrial biopsy at the time of the exam on Mar 17, 2002, showed endometrium with complex hyperplasia without atypia, but it was associated with extensive degenerative change, and Dr. Guilford Shi advised D&C. The D&C was done and showed endometrium with atypical complex hyperplasia and benign endometrial polyps associated with hyperplastic change.  The case was reviewed between Dr. Guilford Shi and Dr. Laureen Ochs, and they concurred with the diagnosis.  The patient was advised hysterectomy, and she is admitted now for that procedure.  PAST MEDICAL HISTORY:  No known drug allergies.  OPERATIONS:  She had a D&C in May 2003.  She has had a tubal ligation and umbilical hernia repair.  ILLNESSES:  High blood pressure, high cholesterol, diabetes.  No known heart problems.  HABITS:  She drinks moderately.  She does not smoke.  REVIEW OF SYSTEMS:  Noncontributory.  MEDICATIONS: 1. Lescol XL 80 mg at night. 2. Glucotrol XL 5 mg before breakfast. 3. Metformin 500 mg 2 before breakfast and 2 before lunch. 4. Cozaar 50 mg 1 q.d.  FAMILY HISTORY:  Mother died at 50 of an aneurysm.  Father died at 69 of pneumonia.   Two sisters have diabetes.  There are three brothers, one with heart problems and two with diabetes.  PHYSICAL EXAMINATION:  GENERAL:  Well-developed, quite obese, white female, weighing 266.5 pounds.  VITAL SIGNS:  Blood pressure 140/84, pulse 80.  HEENT:  No cranial abnormalities.  Extraocular movements intact.  Nose and pharynx clear.  There is an upper dental plate.  NECK:  Supple without thyromegaly.  HEART:  Normal size and sounds.  No murmurs.  LUNGS:  Clear to P&A.  BREASTS:  Soft without masses.  ABDOMEN:  Massively obese without obvious masses or tenderness.  There is a supraumbilical transverse scar from her umbilical herniorrhaphy.  VULVA/VAGINA:  Clean.  PELVIC:  The cervix is hard to see because the patient has a hard time coming to the end of the table.  Pap smear on October 01, 2001, was within normal limits.  The uterus is difficult to feel.  During her surgical procedure, the uterus sounded to four inches, slightly to the right, and slightly anteriorly. The adnexa are free of masses.  RECTAL:  Negative in November 2002.  ADMITTING IMPRESSION: 1. Atypical complex endometrial hyperplasia with endometrial polyps on D&C. 2. Massive obesity. 3. Hypertension. 4. Hypercholesterolemia. 5. Diabetes.  PLAN:  The patient is admitted for abdominal hysterectomy and bilateral salpingo-oophorectomy.  She understands the risks of surgery include but are not limited to heart attack, stroke, pulmonary embolus, wound disruption, hemorrhage with need for re-operation and/or  transfusion, fistula formation, nerve injury.  She understands and agrees to proceed. Dictated by:   Malachi Pro. Ambrose Mantle, M.D. Attending Physician:  Malon Kindle DD:  05/13/02 TD:  05/13/02 Job: [id number]

## 2011-03-23 NOTE — Letter (Signed)
August 23, 2006     [name]. Charlann Boxer, M.D.  [facility]  [address]  [address]  [place], [place] [address]   RE:  [name], [name]  MRN:  [id number]  /  DOB:  [birth year]   Dear Dr. Charlann Boxer:   Thank you for referring [name] for preoperative clearance.  I saw her on  August 16, 2006, and requested follow-up labs prior to clearance.  She did  not complete the lab studies until August 21, 2006, and I am in receipt of  them only at this time.  She has had hysterectomy and bilateral salpingo-  oophorectomy following a D&C as well as catheterization and a tubal.  She  has had no perioperative complications.   She does have the metabolic syndrome and has exhibited varying compliance  with recommendations.  I had requested follow-up labs in July which were not  done and then these were reordered in September and again not completed.  I  will fax you a copy of the labs which were completed on August 21, 2006.  She states that her diabetes is controlled with fasting blood sugars less  than 120.  She is not monitoring two-hour postprandial sugars. She describes  polyuria and polydipsia.  She denies any cardiopulmonary symptoms.  She does  have symptoms of carpal tunnel at times in the mornings.   She states that she is involved in water aerobics three times a week for 60  minutes with no cardiopulmonary symptoms.   She is 5 feet 4-1/2 inches and weight 257 pounds.  Blood pressure was  110/70, pulse 64 and regular.  She does have bilateral carotid bruits.  The  carotid Dopplers were negative in 2002.  She has a grade 1.5 to 2 systolic  murmur at the left base.  Abdomen is protuberant.  There is fusiform changes  in the knees.  She had difficulty getting onto the table due to her  arthritic condition.   EKG was stable.   She exhibited a mild anemia with hematocrit of 35.4.  Total cholesterol was  206 with an LDL of 133.  Her BUN, creatinine, potassium, SGOT and SGPT were  normal.  Iron, B12 and folate levels were all normal.   Her hemoglobin A1c was 5.9 indicating average sugars of 131 with an 18%  increased cardiovascular risk.  She is on Crestor 20 mg daily as well as  Actos 30 mg daily.  Because of the persistent lipid abnormalities and the  A1c of 5.9, I would recommend that she take Actos 45 mg daily.  I am afraid  that unless she can optimize her weight and dietary compliance, she will  salpingo-oophorectomy be on high dose insulins.   I will recommend sliding scale coverage perioperatively.  Should medical  issues arise, she can be seen by the [facility] or  Hospitalist, Dr. Vikki Ports A. Leschber.   I have recommended follow-up carotid Doppler to assess the bruits as her  last study was in 2002.   I did explain to [name] that the knee surgery will be involved and  painful and require her motivation and drive to have a good postoperative  result.   I appreciate your care of this nice lady.    Sincerely,      Titus Dubin. Alwyn Ren, MD,FACP,FCCP    [facility]/MedQ  /  Job #:  [id number]  DD:  08/23/2006 / DT:  08/23/2006

## 2011-03-23 NOTE — Discharge Summary (Signed)
[name], [name]                ACCOUNT NO.:  [id number]   MEDICAL RECORD NO.:  [id number]          PATIENT TYPE:  INP   LOCATION:  5024                         FACILITY:  [facility]   PHYSICIAN:  Madlyn Frankel. Charlann Boxer, M.D.  DATE OF BIRTH:  09/27/1941   DATE OF ADMISSION:  08/27/2006  DATE OF DISCHARGE:  08/31/2006                                 DISCHARGE SUMMARY   ADMITTING DIAGNOSES:  1. Osteoarthritis.  2. Hypertension.  3. Diabetes.  4. Esophageal reflux.  5. Diabetes type 2.   DISCHARGE DIAGNOSES:  Include:  1. Osteoarthritis.  2. Hypertension.  3. Diabetes.  4. Esophageal reflux.   PROCEDURE:  Patient was admitted to hospital for left total knee  replacement.  Surgeon was Dr. Durene Romans, first assistant Dwyane Luo, PA-C.   CONSULTATIONS:  [facility] for cough and wheezing.   BRIEF HISTORY:  [name] is a 70 year old female who is well known to our  practice and has a history of chronic knee pain.  She had tried conservative  measures but had continued to have bilateral knee pain, degenerative changes  on x-rays, and cortisone injections had not provided any longstanding  relief.  Her knee hurts her all the time and even wakes her up at night.  Her gait is significantly altered, and due to failed conservative measures,  has elected to go ahead with total knee replacement.   LABORATORY DATA:  Preoperative labs:  Complete blood count shows  preoperatively on August 21, 2006, hemoglobin 12.3, hematocrit 36.4.  Tracked serially during her stay, then August 28, 2006, her hemoglobin was  8.7, hematocrit 25.6.  On August 29, 2006, hemoglobin 9.0, hematocrit 26.6.  On August 30, 2006, hemoglobin 7.4, hematocrit 21.5.  She was administered  2 units packed red blood cells.  Afterwards, hemoglobin 9.8, hematocrit  29.0.  Routine chemistries preoperatively shows sodium 139, potassium 4.2,  glucose 108.  August 28, 2006 sodium 131, glucose 141.  August 29, 2006  sodium 137, glucose 152.  August 30, 2006 sodium 136, glucose 113.  Blood  bank testing said that she was A-positive.  X-rays pre admission:  Chest x-  ray showed heart size mildly enlarged.  No effusions or edema, no airspace  opacities identified, no pneumonia.  Cardiology EKG showed normal sinus  rhythm.   HOSPITAL COURSE:  Patient tolerated left total knee replacement, was  admitted to the orthopedic floor.  Recovery was doing well coming out of the  PACU.  On postop day #1, the femoral nerve block was continuing to wear off.  Pain was well controlled.  Home medications were administered.  Pain was  well controlled with analgesics.  Wound dressing was clean, dry, and intact.  Postop day #2, patient continued to do okay; did have some hurting.  Wound  was clean, dry, and intact.  She was still unable to do a straight leg  raise.  We continued PT, OT and planned for discharge planning on Friday.  On postop day #3, patient had some postoperative anemia.  She was  administered 2 units of packed red blood cells  and we checked her H&H  approximately q.2 afterwards and it had returned to 9.8 hemoglobin.  Wound  continued to be clean, dry, and intact.  She was neuromuscularly and  vascularly intact throughout the course of her stay.  On day #3, she was  complaining of a cough with productive sputum.  And on physical exam, her  lungs were clear on the left side but she did have some mild upper right  lobe congestion.  [facility] was consulted to assess her  condition.  They stated that there was chest x-ray was performed which  showed no pneumonia and she was cleared for discharge on postop day #4.  Physical therapy during her course of stay, PT and OT evaluation found that  she, prior to surgery, she was completely independent.  They did instruct  her on self care, ambulation, transfers, recommended treatment plans.  By  postop day #2, patient was utilizing the rolling walker,  walked 76 feet.  By  postop day #3, patient was ambulating 200 feet with the rolling walker.  And  was progressing nicely.  After being cleared medically, she was discharged  home on the 4th day.   DISPOSITION:  Discharged home.   DISCHARGE CONDITION:  Stable and improved.   DISCHARGE DIET:  Regular as per diabetic condition.   DISCHARGE MEDICATIONS:  Include:  1. Lisinopril 20 mg p.o. daily.  2. Actos 30 mg p.o. daily.  3. Glipizide 5 mg p.o. daily.  4. Metformin 1000 mg p.o. b.i.d.  5. Crestor 20 mg p.o. daily.  6. Cozaar 50 mg p.o. b.i.d.  7. Lovenox 30 mg 1 subcu q.12 x10 more days.  8. Robaxin 500 mg 1-2 p.o. q.4-6 p.r.n. muscle spasm or pain.  9. Norco 1-2 p.o. q.4-6 p.r.n. pain.  10.Enteric coated aspirin 325 after Lovenox for 4 more weeks.  11.Iron 325 x3 daily.  12.Guaifenesin 400 mg 1 p.o. q.4-6 p.r.n. congestion.   DISCHARGE PLANS:  Physical therapy recommended for an additional 7 times per  week for the next week at which time further evaluation will be performed.  Physical therapy would continue to try to maximize the patient's range of  motion, minimize pain, improve muscle strength, promote ambulation, and  encourage independence in performing the activities of daily living.  She  will utilizing a rolling walker.  After she progresses from that, she will  move to a cane in her opposite hand.   WOUND CARE:  She should maintain a clean and dry wound.  She can shower, but  she needs to clean and dry and replace the dressing, and she should do so on  a daily basis.   DISCHARGE FOLLOWUP:  She should follow up with Dr. Charlann Boxer at [phone] in  approximately 10 days to check the incision as well as placement of the  prosthesis.     ______________________________  Yetta Glassman Loreta Ave, [place]      Madlyn Frankel. Charlann Boxer, M.D.  Electronically Signed    BLM/MEDQ  D:  09/24/2006  T:  09/24/2006  Job:  [id number]

## 2011-03-23 NOTE — Cardiovascular Report (Signed)
[facility]  Patient:    [name], [name] Visit Number: [id number] MRN: [id number]          Service Type: CAT Location: [facility] 2871 01 Attending Physician:  Lenoria Farrier Proc. Date: 09/16/01 Admit Date:  09/16/2001 Discharge Date: 09/16/2001   CC:         Titus Dubin. Alwyn Ren, M.D. [facility]                        Cardiac Catheterization  INDICATIONS:  Ms. Slutsky is 70 years old and has diabetes, borderline hypertension, and hyperlipidemia.  Because of her risk factors, she had a screening Cardiolite done by Dr. Alwyn Ren which suggested anterior ischemia.  I saw her in consultation and arranged for her to come in for evaluation with angiography.  DESCRIPTION OF PROCEDURE:  The procedure was performed via the right femoral artery using arterial sheath and 6 French preformed coronary catheters.  A front wall arterial puncture was performed and Omnipaque contrast was used. The right femoral artery was closed with Perclose at the end of the procedure. The patient tolerated the procedure well and left the laboratory in satisfactory condition.  RESULTS:  The left main coronary artery was free of significant disease.  Left anterior descending artery gave rise to four septal perforators and two diagonal branches.  These and the LAD proper were free of significant disease.   The circumflex artery gave rise to an intermediate branch, a marginal branch, two posterolateral branches, and a posterior descending branch.  These vessels were free of significant disease.  The right coronary artery was a nondominant vessel.  This vessel was free of significant disease.  The left ventriculogram performed in the RAO projection showed good wall motion with no areas of hypokinesis.  The estimated ejection fraction was 60%.  The aortic pressure was 147/70 with a mean of 97.  The left ventricular pressure was 147/21.  CONCLUSION:  Normal coronary angiography and  left ventricular wall motion.  RECOMMENDATIONS:  Reassurance.  In view of these findings, I suspect the Cardiolite scan was a false positive scan possibly related to breast attenuation.  Will plan reassurance and continued primary prevention per Dr. Alwyn Ren. Attending Physician:  Lenoria Farrier DD:  09/16/01 TD:  09/17/01 Job: [id number]

## 2011-03-23 NOTE — Op Note (Signed)
[facility]  Patient:    [name], [name] Visit Number: [id number] MRN: [id number]          Service Type: GYN Location: 4W [id number] [id number] Attending Physician:  Malon Kindle Dictated by:   Malachi Pro. Ambrose Mantle, M.D. Proc. Date: 05/13/02 Admit Date:  05/13/2002 Discharge Date: 05/15/2002                             Operative Report  PREOPERATIVE DIAGNOSIS:  Complex atypical endometrial hyperplasia.  POSTOPERATIVE DIAGNOSIS:  Complex atypical endometrial hyperplasia.  OPERATION:  Abdominal hysterectomy with bilateral salpingo-oophorectomy.  SURGEON:  Malachi Pro. Ambrose Mantle, M.D.  ASSISTANT:  Zenaida Niece, M.D.  ANESTHESIA:  General.  DESCRIPTION OF PROCEDURE:  The patient was brought to the operating room and placed under satisfactory general anesthesia and placed in the frog-leg position.  While I was prepping her, she became ________, so I had to delay inserting the Foley catheter.  I then inserted the Foley catheter to straight drain, and after I prepped the urethra, placed the patient supine.  The abdomen was draped as a sterile field.  The abdomen was entered by a transverse incision and carried in layers through the skin, subcutaneous tissue, and fascia.  The fascia was separated from the rectus muscle inferiorly and superiorly.  The rectus muscle was already split in the midline and the peritoneum was opened vertically.  Exploration of the upper abdomen revealed the liver to be smooth.  The gallbladder was huge and very tensely cystic.  I did not feel any stones.  Both kidneys felt normal.  Exploration of the pelvis revealed the uterus to be anterior and normal size.  Both tubes and ovaries appeared normal except for evidence of previous tubal ligation.  The cul-de-sacs were free of disease.  The bowel was packed away with packs and a self-retaining retractor was used for exposure.  The upper pedicles of the uterus were clamped across.  The  uterus was drawn up into the operative field. I could not see the infundibulopelvic ligaments well enough to attack them at this point, so I divided both round ligaments with the electrical current, and developed a bladder flap.  I divided the utero-ovarian ligaments between the clamps and then suture ligated the utero-ovarian ligament.  The uterine vessels were skeletonized, clamped, cut, and suture ligated.  The parametrium and paracervical tissues and uterosacral ligaments were clamped, cut, and suture ligated and held.  Dr. Jackelyn Knife had to leave the operating theater to go deliver a baby at [facility], so I used two surgical assistants in the operating room to assist.  Dr. Jackelyn Knife returned prior to removing the uterus.  We entered the right side of the vagina, placed a right vaginal angle suture, and then removed the uterus by transecting the upper vagina.  The left tube and ovary had been removed while Dr. Jackelyn Knife was away by doubly suture ligating the left infundibulopelvic ligament.  At this point, the right infundibulopelvic ligament was easily seen.  I clamped, cut, and suture ligated it with two ligatures for hemostasis.  I inspected the pelvis.  I did close the vagina with multiple interrupted figure-of-eight sutures of 0 Vicryl after placing the left vaginal angle suture.  Hemostasis was achieved with a small amount of use of the Bovie. Liberal irrigation confirmed hemostasis.  The bladder was filled with methylene blue stained fluid.  There was no leakage and there were no sutures close  to the bladder.  I then placed a suture through the uterosacral ligaments bilaterally and tied it in the midline to give the vagina some support.  I did not reperitonealize over the cuff.  All packs and retractors were removed after hemostasis was deemed completely adequate and the peritoneum was closed with a running suture of 0 Vicryl, the rectus muscle with interrupted 0 Vicryl,  the fascia with two running sutures of 0 Vicryl, subcutaneous with a running 3-0 Vicryl, and the skin was closed with automatic staples.  The patient seemed to tolerate the procedure well.  Blood loss was about 100 cc.  Sponge and needle counts were correct and she was returned to the recovery room in satisfactory condition.  Note that the patients quite obese stature made the surgery quite difficult and the pelvis was deep. Dictated by:   Malachi Pro. Ambrose Mantle, M.D. Attending Physician:  Malon Kindle DD:  05/13/02 TD:  05/17/02 Job: [id number]

## 2011-04-09 ENCOUNTER — Encounter (INDEPENDENT_AMBULATORY_CARE_PROVIDER_SITE_OTHER): Payer: Self-pay | Admitting: General Surgery

## 2011-04-26 ENCOUNTER — Other Ambulatory Visit (INDEPENDENT_AMBULATORY_CARE_PROVIDER_SITE_OTHER): Payer: Self-pay | Admitting: General Surgery

## 2011-04-26 DIAGNOSIS — Z853 Personal history of malignant neoplasm of breast: Secondary | ICD-10-CM

## 2011-04-26 DIAGNOSIS — Z9889 Other specified postprocedural states: Secondary | ICD-10-CM

## 2011-04-30 ENCOUNTER — Other Ambulatory Visit: Payer: Self-pay | Admitting: Internal Medicine

## 2011-05-07 ENCOUNTER — Ambulatory Visit (INDEPENDENT_AMBULATORY_CARE_PROVIDER_SITE_OTHER): Payer: Medicare Other | Admitting: General Surgery

## 2011-05-07 ENCOUNTER — Other Ambulatory Visit (INDEPENDENT_AMBULATORY_CARE_PROVIDER_SITE_OTHER): Payer: Self-pay | Admitting: General Surgery

## 2011-05-07 VITALS — Temp 98.0°F | Wt 274.6 lb

## 2011-05-07 DIAGNOSIS — N61 Mastitis without abscess: Secondary | ICD-10-CM

## 2011-05-07 NOTE — Patient Instructions (Signed)
Start doxycycline. U/S guided aspiration left breast with cultures F/U 1 week

## 2011-05-07 NOTE — Progress Notes (Signed)
History of present illness The patient returns today with her tenderness and redness of her left breast. She was seen approximately one week ago at which time her exam was essentially normal. She has had fever she she has had no drainage from her left nipple.  Physical exam The patient has significant cellulitis centrally in the left breast. She continues to have the same fullness that she's had on previous exam laterally at her old lumpectomy site.  Assesment and plan She has cellulitis and a possible infection of her old seroma cavity. We will  plan to start her on doxycycline twice a day for the next 2 weeks. We will also schedule an ultrasound guided aspiration of the fluid with culture. I will plan to see her back in one week to check her progress.

## 2011-05-08 ENCOUNTER — Ambulatory Visit
Admission: RE | Admit: 2011-05-08 | Discharge: 2011-05-08 | Disposition: A | Discharge status: Home / Self Care | Payer: Medicare Other | Source: Ambulatory Visit | Attending: General Surgery | Admitting: General Surgery

## 2011-05-08 DIAGNOSIS — N61 Mastitis without abscess: Secondary | ICD-10-CM

## 2011-05-21 ENCOUNTER — Ambulatory Visit (INDEPENDENT_AMBULATORY_CARE_PROVIDER_SITE_OTHER): Payer: Medicare Other | Admitting: General Surgery

## 2011-05-21 DIAGNOSIS — C50919 Malignant neoplasm of unspecified site of unspecified female breast: Secondary | ICD-10-CM

## 2011-05-21 NOTE — Patient Instructions (Signed)
Finish antibiotics Call if the redness returns F/U in 3 months

## 2011-05-21 NOTE — Progress Notes (Signed)
Subjective:     Patient ID: [name], female   DOB: 01/09/1941, 70 y.o.   MRN: [id number]  HPI   Review of Systems The patient is a 70 year old female who is now 3 years out from left breast lumpectomy for DCIS. 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 The drainage finally resolved after a couple years. We saw her recently with some redness of the breast. We had the seroma cavity aspirated and started her on antibiotics. The redness is now resolved and she feels normal again. She's not having any pain.    Objective:   Physical Exam On exam the redness of the left breast is resolved. She still has some palpable fullness in the lateral aspect of the left breast which is persistent. Other than that she has no other palpable mass in either breast. No axillary supraclavicular or cervical lymphadenopathy in either side. Her abdomen is soft and nontender. Lungs are clear bilaterally with no use of accessory respiratory muscles .    Assessment:     3 years postop from left breast lumpectomy for DCIS.    Plan:     She will call me if the redness recurs. Her last day of antibiotics his today and I've asked her to finish out her course. If she does well then we will plan to see her back in 3 months at the time of her mammogram and ultrasound

## 2011-05-28 ENCOUNTER — Other Ambulatory Visit: Payer: Self-pay | Admitting: Internal Medicine

## 2011-06-04 ENCOUNTER — Other Ambulatory Visit: Payer: Self-pay | Admitting: Internal Medicine

## 2011-06-04 NOTE — Telephone Encounter (Signed)
2)  Please schedule a follow-up appointment in 4 months. 3)  HbgA1C prior to visit, ICD-9:250.02 4)  Urine Microalbumin prior to visit, ICD-9:250.02  Copied from 12/2010, labs were due 04/2011

## 2011-06-18 ENCOUNTER — Other Ambulatory Visit: Payer: Self-pay | Admitting: Internal Medicine

## 2011-06-18 NOTE — Telephone Encounter (Signed)
Lipid/Hep 272.4/995.20  

## 2011-06-29 ENCOUNTER — Other Ambulatory Visit: Payer: Self-pay | Admitting: Internal Medicine

## 2011-07-02 ENCOUNTER — Other Ambulatory Visit (INDEPENDENT_AMBULATORY_CARE_PROVIDER_SITE_OTHER): Payer: Medicare Other

## 2011-07-02 DIAGNOSIS — IMO0001 Reserved for inherently not codable concepts without codable children: Secondary | ICD-10-CM

## 2011-07-02 LAB — MICROALBUMIN / CREATININE URINE RATIO
Creatinine,U: 89.8 mg/dL
Microalb Creat Ratio: 1.7 mg/g (ref 0.0–30.0)
Microalb, Ur: 1.5 mg/dL (ref 0.0–1.9)

## 2011-07-02 LAB — HEMOGLOBIN A1C: Hgb A1c MFr Bld: 7.3 % — ABNORMAL HIGH (ref 4.6–6.5)

## 2011-07-02 NOTE — Progress Notes (Signed)
Labs only

## 2011-07-03 ENCOUNTER — Other Ambulatory Visit: Payer: Self-pay | Admitting: Internal Medicine

## 2011-07-11 ENCOUNTER — Ambulatory Visit (INDEPENDENT_AMBULATORY_CARE_PROVIDER_SITE_OTHER): Payer: Medicare Other | Admitting: Internal Medicine

## 2011-07-11 ENCOUNTER — Encounter: Payer: Self-pay | Admitting: Internal Medicine

## 2011-07-11 VITALS — BP 130/86 | HR 60 | Wt 277.0 lb

## 2011-07-11 NOTE — Progress Notes (Signed)
Subjective:    Patient ID: [name], female    DOB: December 14, 1940, 70 y.o.   MRN: [id number]  HPI Diabetes status assessment: Fasting or morning glucose range:  Low ? - high  150+ or average :  ?  . Highest glucose 2 hours after any meal:  Not checked. Hypoglycemia :  no .                                                     Excess thirst :yes;  Excess hunger:  no ;  Excess urination:  no.                                  Lightheadedness with standing:  no. Chest pain:  no ; Palpitations :no ;  Pain in  calves with walking:  no .                                                                                                                                 Non healing skin  ulcers or sores,especially over the feet:  no. Numbness or tingling or burning in feet : no .                                                                                                                                              Significant change in  Weight : up 2 #. Vision changes : no  .                                                                    Exercise : 30 min on elliptical & 60 min in water [employer]; elliptical x 60 min Th-Sat . Nutrition/diet: " Trying to leave bread alone". Medication compliance : yes. Medication adverse  Effects:  no . Eye exam : 7/12: negative. Foot care : yes ,  6 mos ago.  A1c/ urine microalbumin monitor:  7.3%/1.5 (7.1/ 1.9)    Review of Systems     Objective:   Physical Exam Gen.: Weight excess. Alert, appropriate and cooperative throughout exam.  Eyes: No corneal or conjunctival inflammation noted. Extraocular motion intact.  Lungs: Normal respiratory effort; chest expands symmetrically. Lungs are clear to auscultation without rales, wheezes, or increased work of breathing. Heart: Normal rate and rhythm. Normal S1 and S2. No gallop, click, or rub. Grade 1/6 systolic murmur.                                                               Musculoskeletal/extremities: No  clubbing, cyanosis, edema, or deformity noted.  Nail health  good. Vascular: Carotid, radial artery pulses are full and equal.Peddal pulses slightly decreased. Carotid rumble from the murmur  bilaterally. Neurologic: Alert and oriented x3. Skin: Intact without suspicious lesions or rashes. Psych: Mood and affect are normal. Normally interactive                                                                                         Assessment & Plan:    #1 diabetes, suboptimal control. Risks, pathophysiology of insulin resistance, and goals discussed   Plan: Continue excellent exercise program. Initiate the nutrition instructions as closely as possible.

## 2011-07-11 NOTE — Patient Instructions (Signed)
Eat a low-fat diet with lots of fruits and vegetables, up to 7-9 servings per day. Avoid obesity; your goal is waist measurement < 40 inches, ideally < 35.Consume less than  30 grams of sugar per day from foods & drinks with High Fructose Corn Sugar as #1,2,3 or # 4 on label. Follow the low carb nutrition program in [employer] as closely as possible to prevent Diabetes progression & complications. White carbohydrates (potatoes, rice, bread, and pasta) have a high spike of sugar and a high load of sugar. For example a  baked potato has a cup of sugar and a  french fry  2 teaspoons of sugar. Yams, wild  rice, whole grained bread &  wheat pasta have been much lower spike and load of  sugar. Portions should be the size of a deck of cards or your palm.  Please  schedule fasting Labs in 3 months : BUN,ctreat, K+,Lipids, hepatic panel, A1c(250.02, 272.4)

## 2011-07-22 IMAGING — CR DG CHEST 2V
2 series · 2 of 2 positions shown · non-contrast
Comparison: 10/30/2009 and 07/25/2009.

CLINICAL DATA: Shortness of breath.  Acute congestive heart
failure.

CHEST - 2 VIEW

[w chest pa]
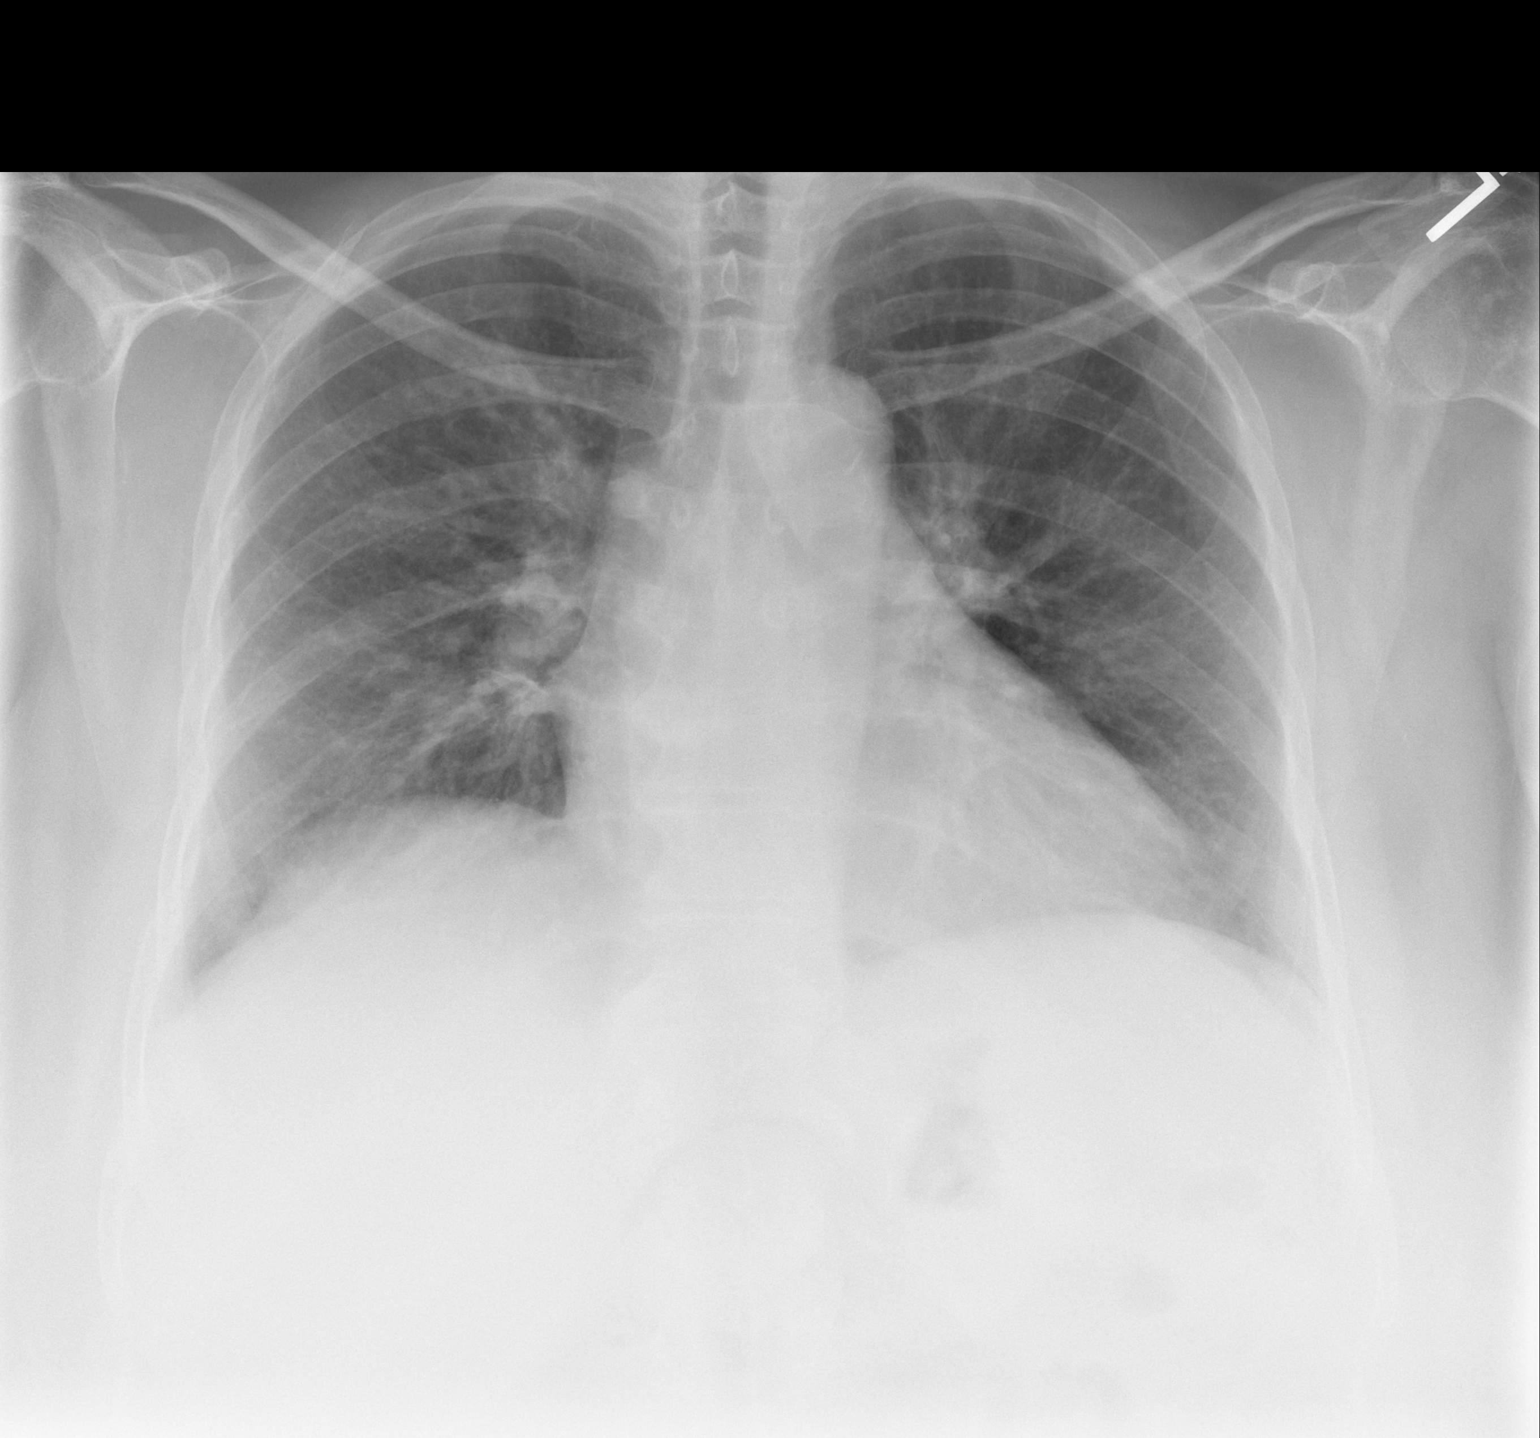

[w chest lat]
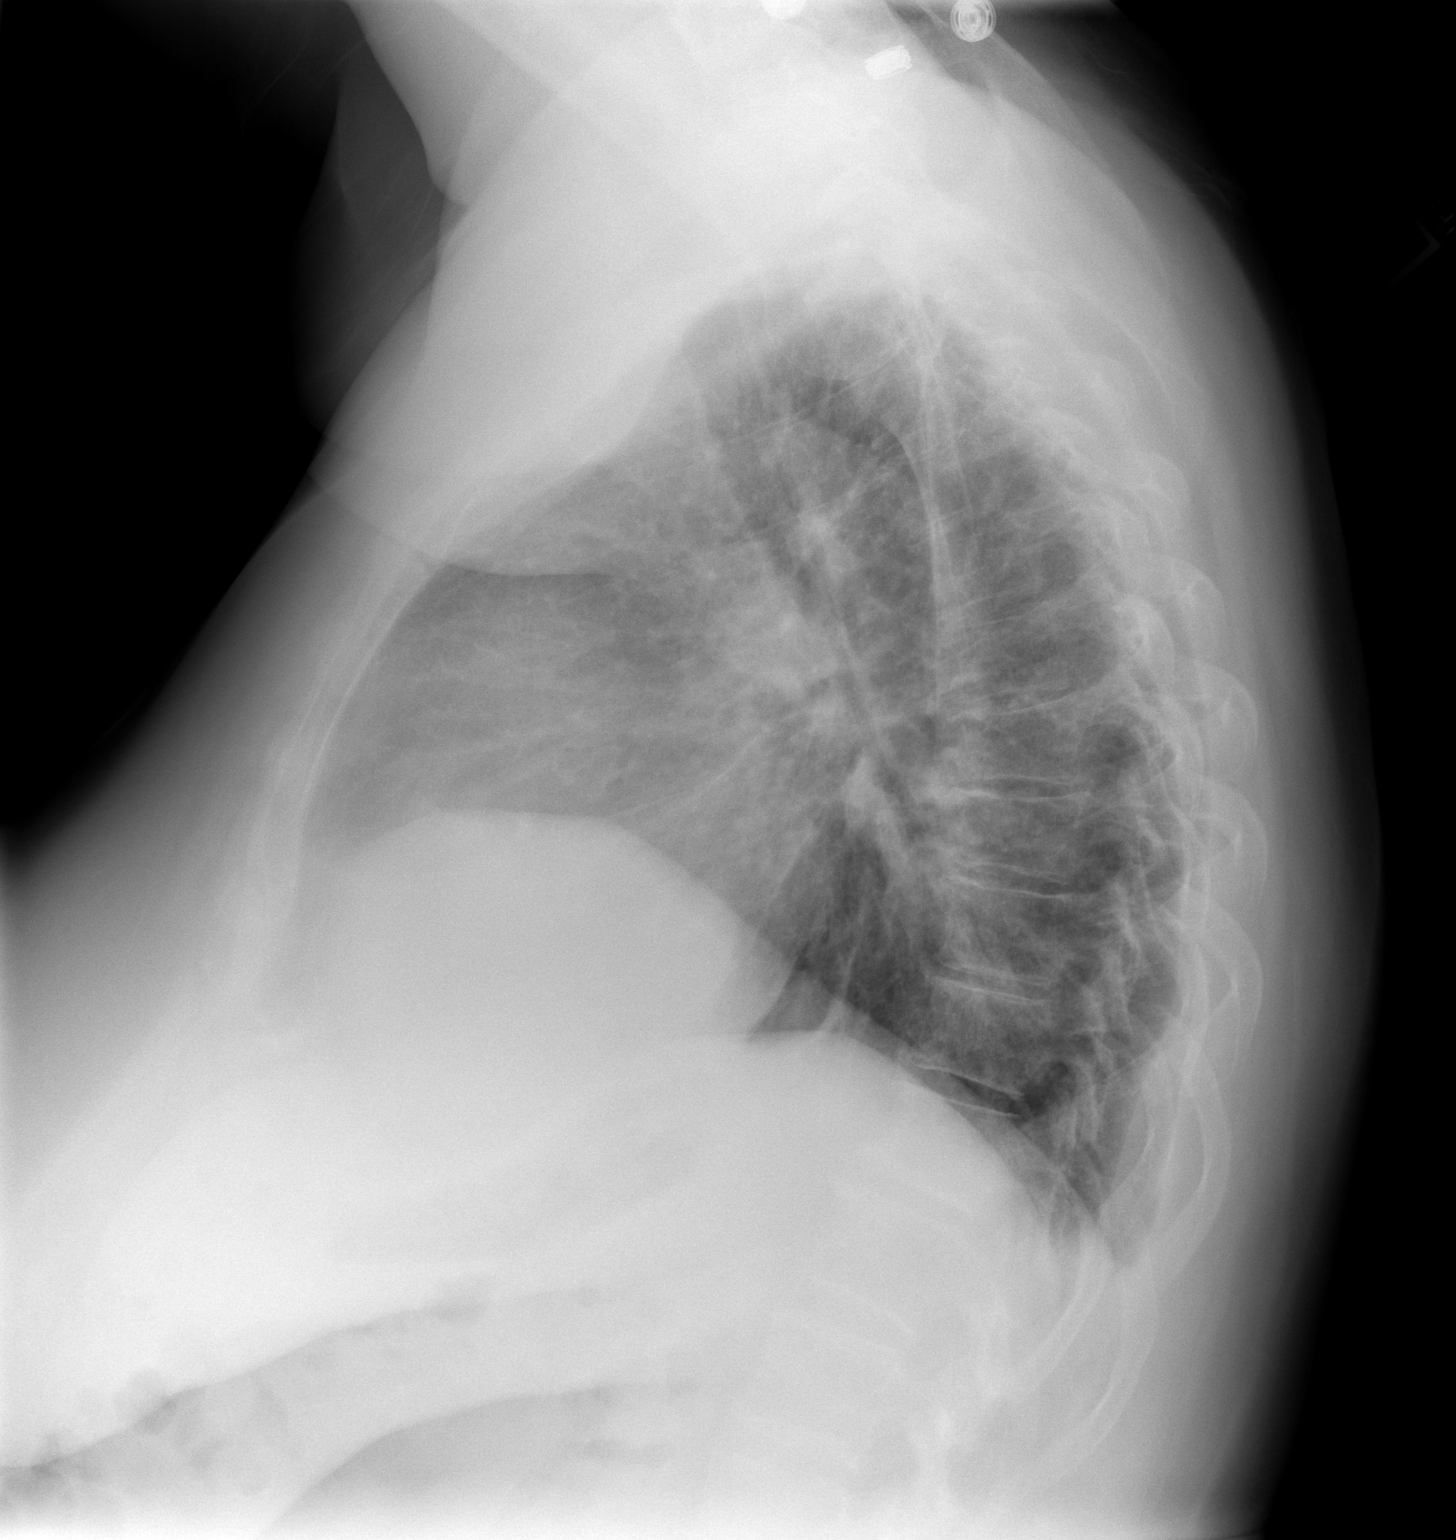

[2 of 2 positions shown; findings below may reference images not displayed]

FINDINGS: There is stable cardiac enlargement.  Pulmonary edema has
resolved with mild residual vascular congestion.  There is no
significant pleural effusion.  The osseous structures appear
unchanged.
IMPRESSION: Interval resolution of pulmonary edema.  Stable mild cardiac
enlargement and vascular congestion.

## 2011-07-24 ENCOUNTER — Other Ambulatory Visit: Payer: Self-pay | Admitting: Internal Medicine

## 2011-07-29 IMAGING — CR DG CHEST 1V PORT
1 series · 1 of 1 positions shown · non-contrast
Comparison: 11/01/2009 and earlier.

CLINICAL DATA: 68-year-old female status post CABG.

PORTABLE CHEST - 1 VIEW

[AP]
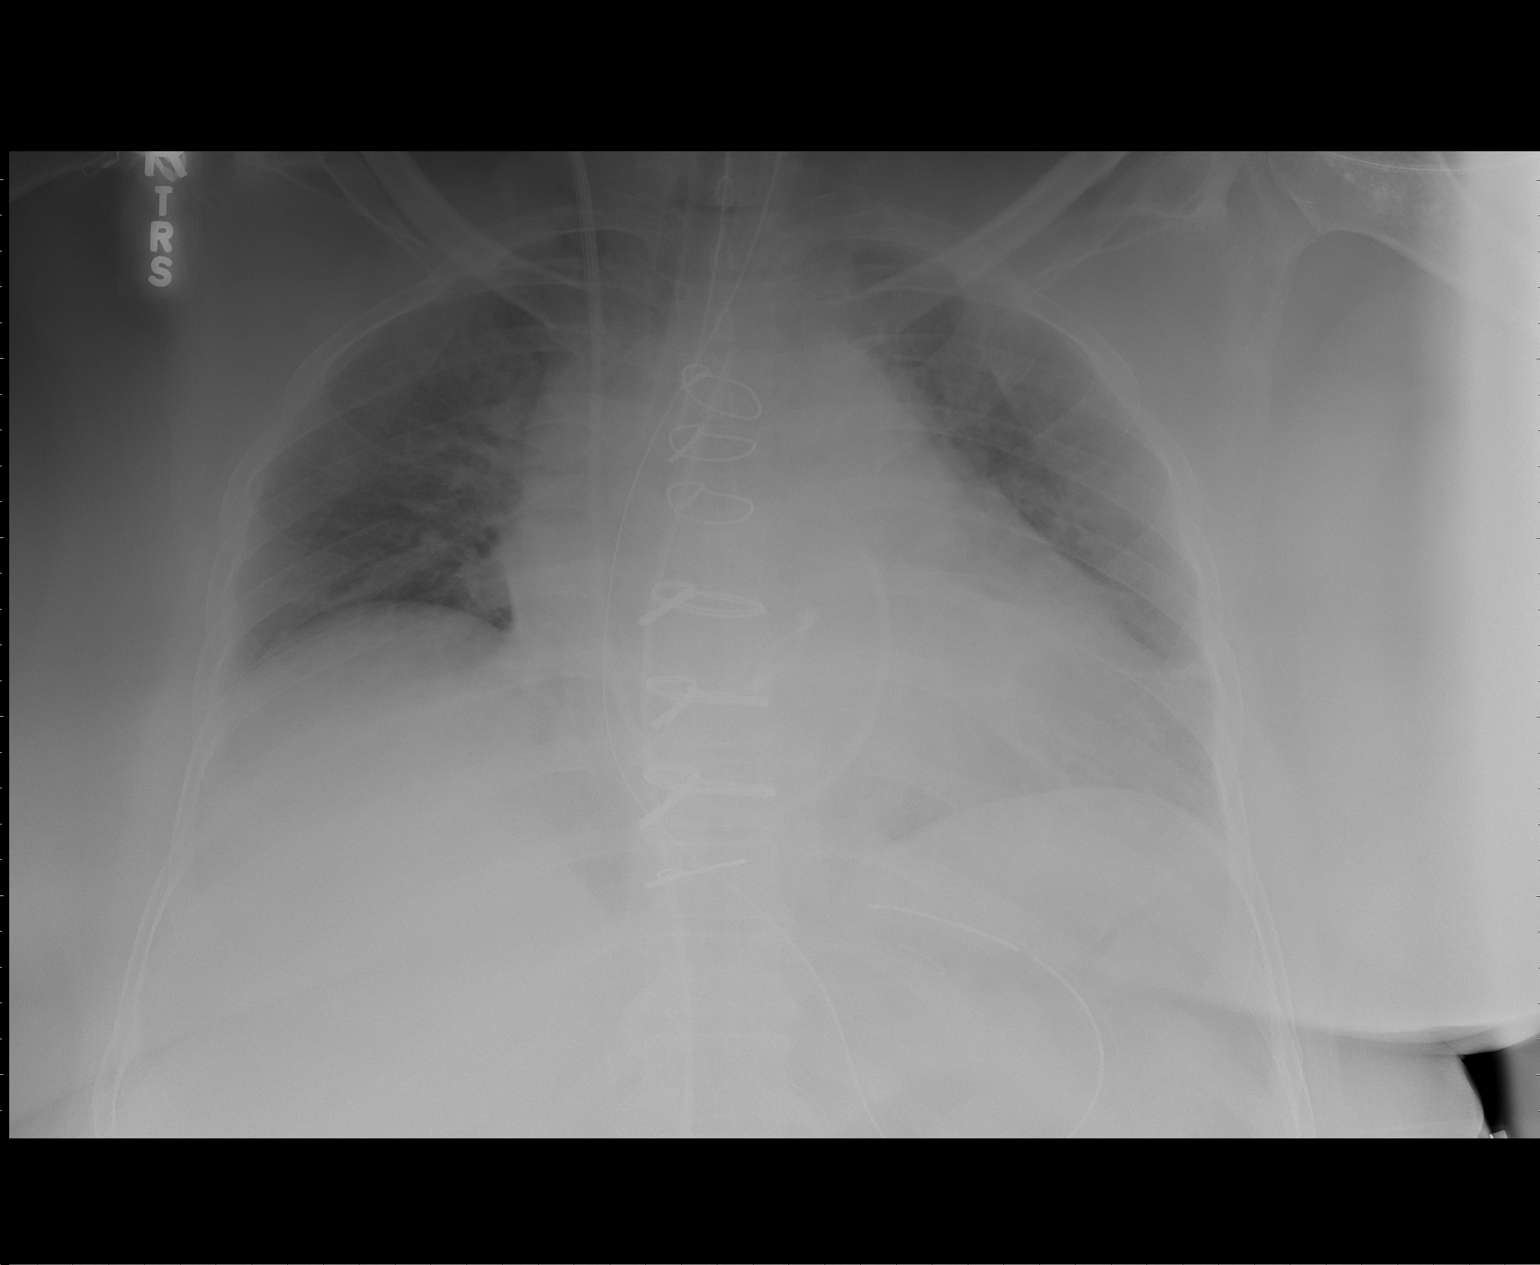

[1 of 1 positions shown; findings below may reference images not displayed]

FINDINGS: Portable supine AP view 0003 hours.

Endotracheal tube tip is the air the carina; the carina is not well
visualized.  Enteric tube is looped in the left upper quadrant.
Right IJ approach Swan-Ganz catheter with tip at the level of the
main pulmonary artery.  Midline mediastinal drain.

Lower lung volumes.  Stable cardiac size and mediastinal contours.
New sternotomy wires and cardiac valve replacement.  No
pneumothorax, pulmonary edema, or pleural effusion.
IMPRESSION: 1.  Endotracheal tube tip is near the carina, could be withdrawn
and up to 2 cm for place at the level of the clavicles.  Other
lines and tubes appear appropriately placed as above.
2. Low lung volumes.  No pneumothorax.

## 2011-07-31 ENCOUNTER — Other Ambulatory Visit: Payer: Self-pay | Admitting: Cardiovascular Disease

## 2011-07-31 IMAGING — CR DG CHEST 1V PORT
1 series · 1 of 1 positions shown · non-contrast
Comparison: 11/09/2009

CLINICAL DATA: Aortic valve replacement.

PORTABLE CHEST - 1 VIEW

[AP]
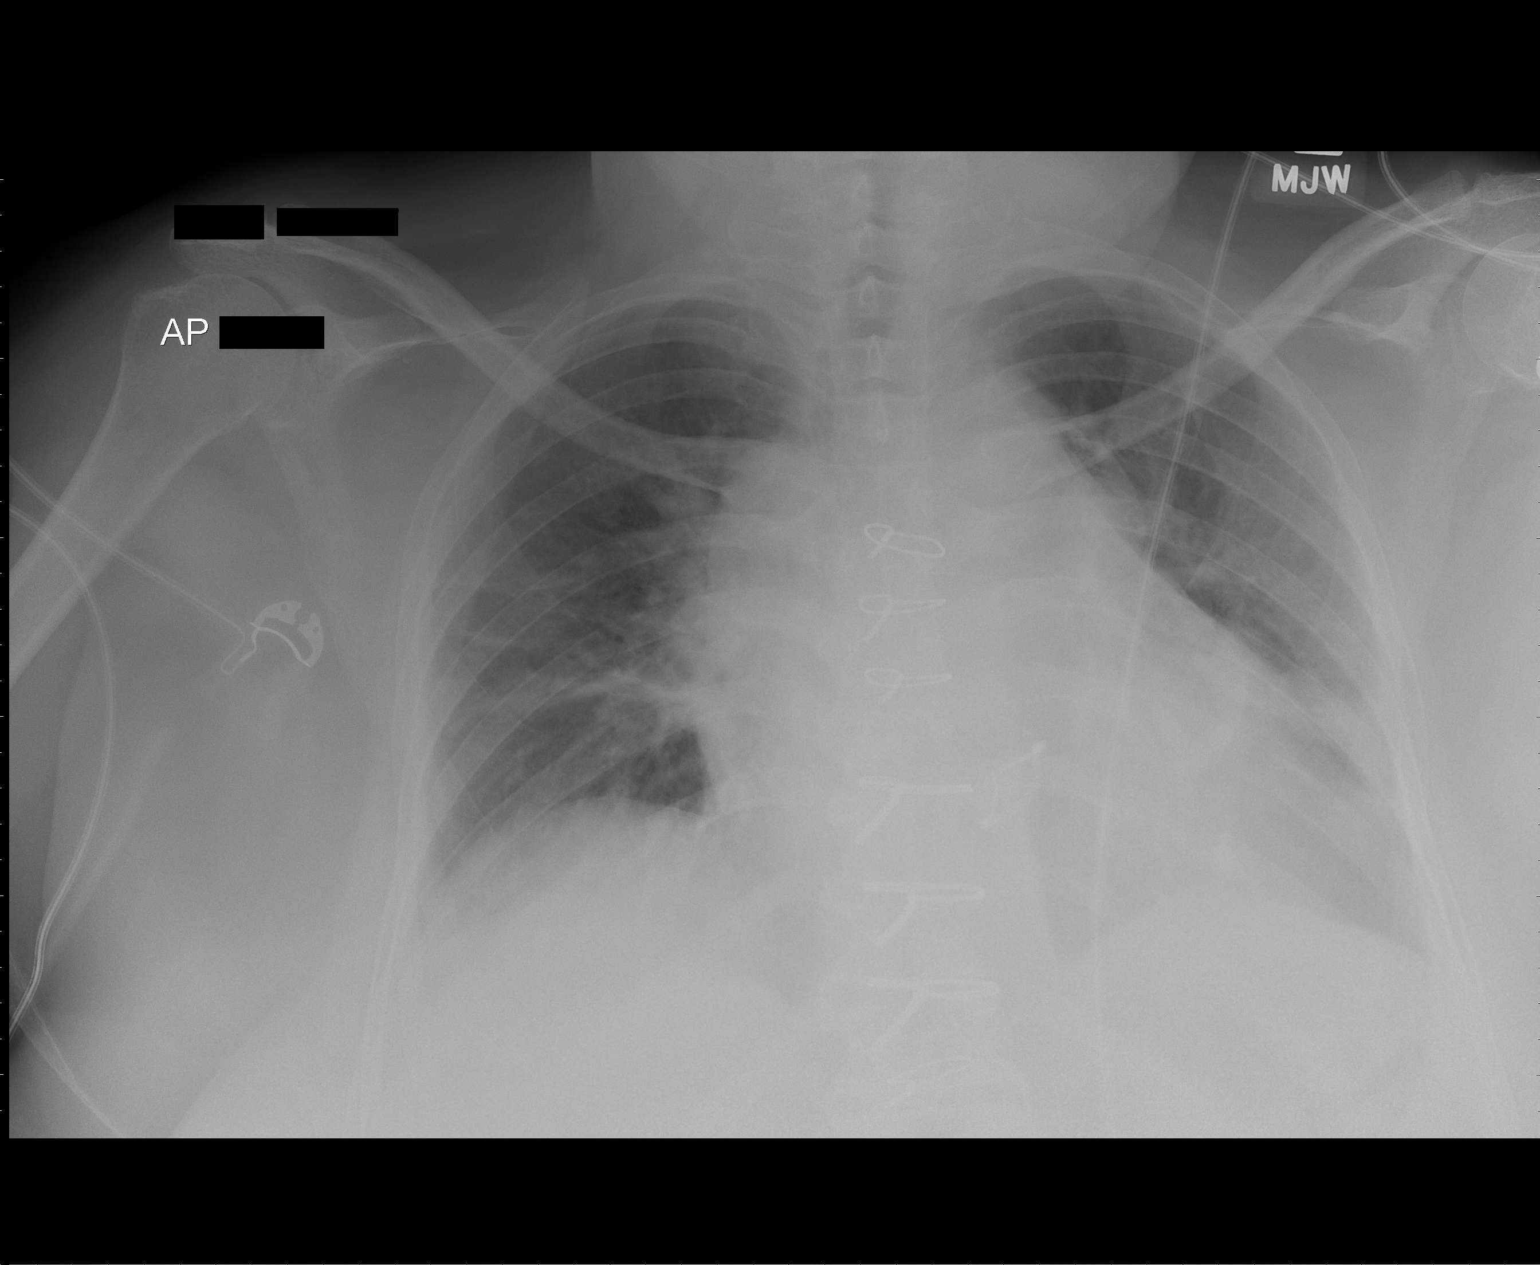

[1 of 1 positions shown; findings below may reference images not displayed]

FINDINGS: Cardiomegaly and aortic valve replacement again noted.
Mild bilateral lower lung atelectasis is improved.
There is no evidence of pneumothorax.
A right Swan-Ganz catheter has been removed.
IMPRESSION: Swan-Ganz catheter removal and decreased bilateral lower lung
atelectasis.

Otherwise stable chest.

## 2011-08-03 LAB — GLUCOSE, CAPILLARY: Glucose-Capillary: 112 — ABNORMAL HIGH

## 2011-08-03 IMAGING — CR DG CHEST 2V
2 series · 2 of 2 positions shown · non-contrast
Comparison: Chest 11/12/2009.

CLINICAL DATA: Status post CABG.  Aortic stenosis.

CHEST - 2 VIEW

[w chest pa]
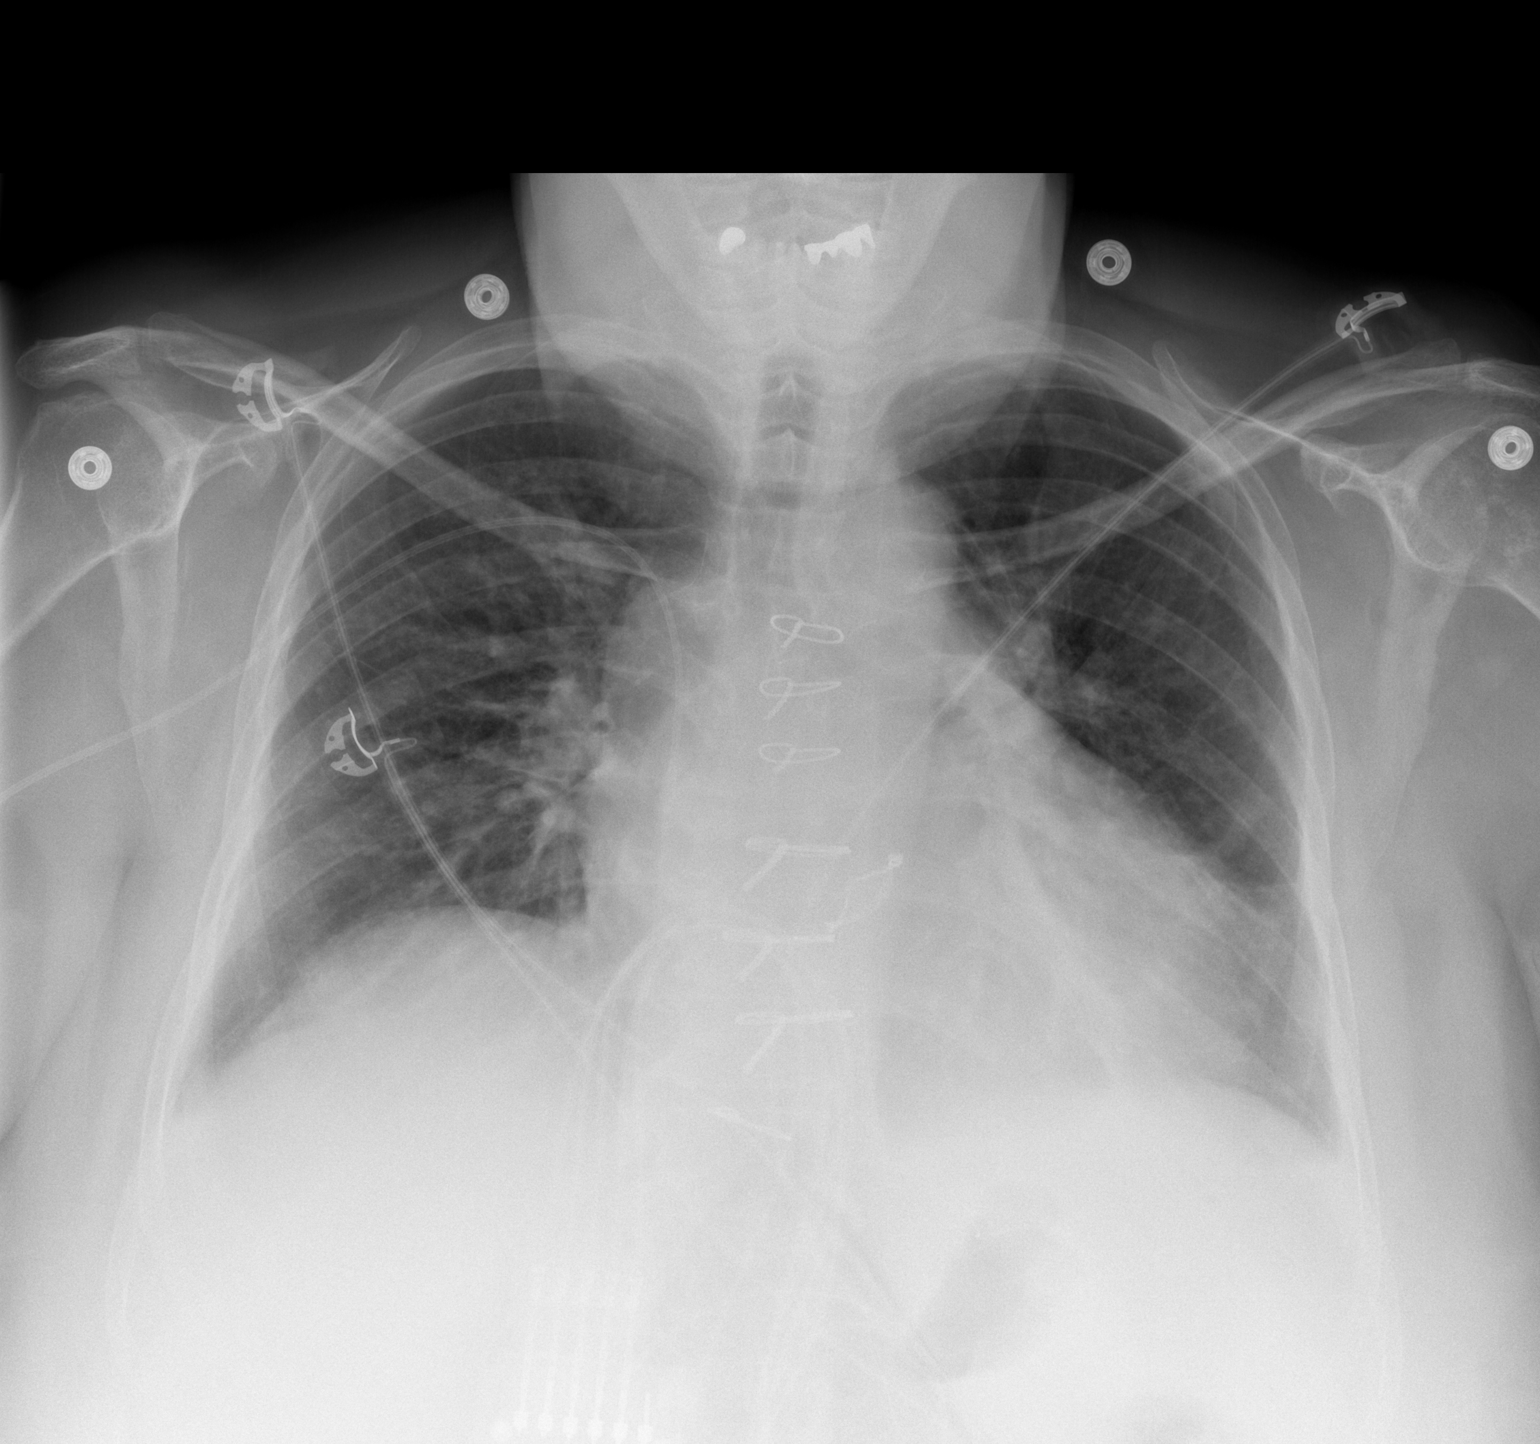

[w chest lat *]
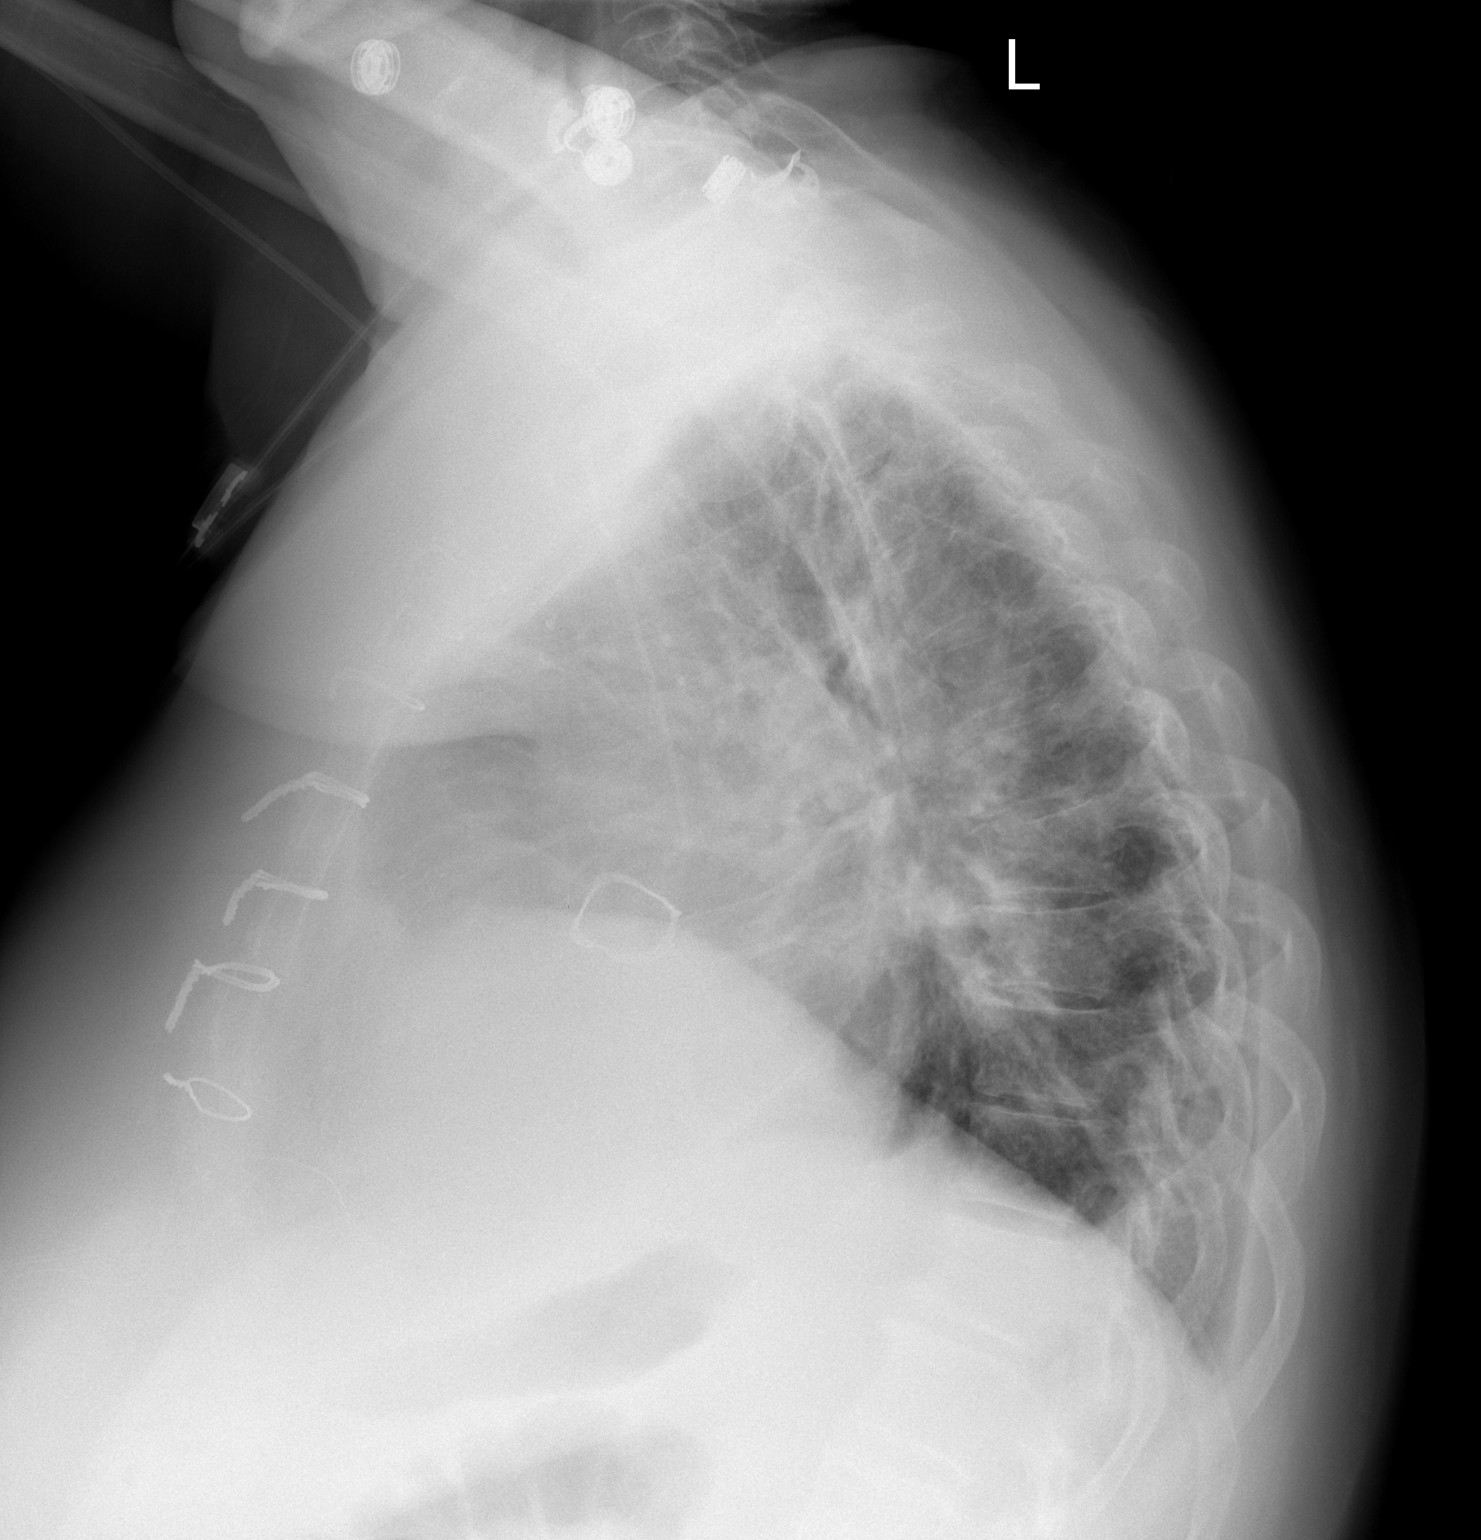

[2 of 2 positions shown; findings below may reference images not displayed]

FINDINGS: Right PICC remains in place.  There is some left basilar
atelectatic change.  Right lung clear.  No effusion.  Cardiomegaly.
IMPRESSION: Cardiomegaly and left basilar atelectasis.

## 2011-08-06 LAB — COMPREHENSIVE METABOLIC PANEL
AST: 19
BUN: 18
CO2: 26
Calcium: 9.7
Chloride: 104
Creatinine, Ser: 0.66
GFR calc Af Amer: >60
GFR calc non Af Amer: >60
Glucose, Bld: 136 — ABNORMAL HIGH
Total Bilirubin: 0.7

## 2011-08-06 LAB — GLUCOSE, CAPILLARY
Glucose-Capillary: 91
Glucose-Capillary: 78
Glucose-Capillary: 85

## 2011-08-06 LAB — DIFFERENTIAL
Basophils Absolute: 0
Eosinophils Relative: 1
Lymphocytes Relative: 35
Lymphs Abs: 3
Neutro Abs: 5.1
Neutrophils Relative %: 58

## 2011-08-06 LAB — CBC
HCT: 35.5 — ABNORMAL LOW
MCHC: 33.9
MCV: 87.3
RBC: 4.06
WBC: 8.8

## 2011-08-14 LAB — BASIC METABOLIC PANEL
CO2: 23
CO2: 25
Calcium: 8.6
Calcium: 8.9
Calcium: 9.5
Chloride: 105
Chloride: 98
Chloride: 98
Chloride: 99
Creatinine, Ser: 0.58
Creatinine, Ser: 0.65
Creatinine, Ser: 0.69
Creatinine, Ser: 0.87
Creatinine, Ser: 1.02
GFR calc Af Amer: >60
GFR calc Af Amer: >60
GFR calc Af Amer: >60
GFR calc Af Amer: >60
GFR calc Af Amer: >60
GFR calc non Af Amer: >60
Glucose, Bld: 112 — ABNORMAL HIGH
Glucose, Bld: 148 — ABNORMAL HIGH
Potassium: 4.4
Sodium: 131 — ABNORMAL LOW
Sodium: 134 — ABNORMAL LOW
Sodium: 136

## 2011-08-14 LAB — POCT I-STAT 3, VENOUS BLOOD GAS (G3P V)
Acid-Base Excess: 5 — ABNORMAL HIGH
Acid-Base Excess: 5 — ABNORMAL HIGH
Bicarbonate: 25.6 — ABNORMAL HIGH
Bicarbonate: 30.5 — ABNORMAL HIGH
O2 Saturation: 70
Operator id: 113391
Operator id: 113391
Operator id: 113391
pH, Ven: 7.376 — ABNORMAL HIGH
pH, Ven: 7.42 — ABNORMAL HIGH

## 2011-08-14 LAB — POCT I-STAT 3, ART BLOOD GAS (G3+)
Acid-Base Excess: 3 — ABNORMAL HIGH
O2 Saturation: 98
Operator id: 113391
pO2, Arterial: 104 — ABNORMAL HIGH

## 2011-08-14 LAB — FERRITIN: Ferritin: 34 (ref 10–291)

## 2011-08-14 LAB — CBC
Hemoglobin: 10.3 — ABNORMAL LOW
Hemoglobin: 12.4
MCHC: 33.8
MCV: 86.3
RBC: 3.56 — ABNORMAL LOW
RBC: 4.24
RDW: 14.2
WBC: 9.2

## 2011-08-14 LAB — CK TOTAL AND CKMB (NOT AT ARMC)
CK, MB: 2.5
Relative Index: 2.1
Total CK: 121

## 2011-08-14 LAB — URINALYSIS, ROUTINE W REFLEX MICROSCOPIC
Nitrite: NEGATIVE
Protein, ur: 100 — AB
Specific Gravity, Urine: 1.021
Urobilinogen, UA: 0.2

## 2011-08-14 LAB — POCT CARDIAC MARKERS
Myoglobin, poc: 78.6
Myoglobin, poc: 90
Operator id: 3067

## 2011-08-14 LAB — URINE MICROSCOPIC-ADD ON

## 2011-08-14 LAB — LIPID PANEL
HDL: 44
Total CHOL/HDL Ratio: 3.8
VLDL: 14

## 2011-08-14 LAB — DIFFERENTIAL
Lymphs Abs: 1.9
Monocytes Absolute: 0.4
Monocytes Relative: 5
Neutro Abs: 6.7
Neutrophils Relative %: 73

## 2011-08-14 LAB — PROTIME-INR
INR: 1
Prothrombin Time: 12.9

## 2011-08-14 LAB — APTT: aPTT: 31

## 2011-08-14 LAB — B-NATRIURETIC PEPTIDE (CONVERTED LAB): Pro B Natriuretic peptide (BNP): 122 — ABNORMAL HIGH

## 2011-08-14 LAB — IRON AND TIBC: TIBC: 381

## 2011-08-16 ENCOUNTER — Other Ambulatory Visit: Payer: Self-pay | Admitting: Internal Medicine

## 2011-08-20 ENCOUNTER — Other Ambulatory Visit: Payer: Self-pay | Admitting: Internal Medicine

## 2011-08-22 LAB — BASIC METABOLIC PANEL
BUN: 13
BUN: 18
CO2: 26
Calcium: 8.2 — ABNORMAL LOW
Calcium: 8.5
Creatinine, Ser: 0.44
Creatinine, Ser: 0.57
GFR calc Af Amer: >60
GFR calc non Af Amer: >60
Potassium: 4.6

## 2011-08-22 LAB — DIFFERENTIAL
Basophils Absolute: 0
Lymphocytes Relative: 25
Monocytes Absolute: 0.7
Neutro Abs: 6.6

## 2011-08-22 LAB — URINALYSIS, ROUTINE W REFLEX MICROSCOPIC
Bilirubin Urine: NEGATIVE
Nitrite: NEGATIVE
Protein, ur: NEGATIVE
Specific Gravity, Urine: 1.013
Urobilinogen, UA: 0.2

## 2011-08-22 LAB — CBC
Hemoglobin: 10.9 — ABNORMAL LOW
MCHC: 34.1
Platelets: 203
Platelets: 240
RBC: 3.09 — ABNORMAL LOW
RDW: 14.4 — ABNORMAL HIGH
WBC: 10.1
WBC: 8.1

## 2011-08-22 LAB — COMPREHENSIVE METABOLIC PANEL
Albumin: 3.7
BUN: 17
Calcium: 9.1
Chloride: 104
Creatinine, Ser: 0.52
Total Bilirubin: 0.4
Total Protein: 6.6

## 2011-08-22 LAB — URINALYSIS, MICROSCOPIC ONLY
Nitrite: NEGATIVE
Specific Gravity, Urine: 1.023
Urobilinogen, UA: 0.2

## 2011-08-22 LAB — TYPE AND SCREEN: ABO/RH(D): A POS

## 2011-08-22 LAB — ABO/RH: ABO/RH(D): A POS

## 2011-08-22 LAB — PROTIME-INR: INR: 1

## 2011-08-22 LAB — URINE MICROSCOPIC-ADD ON

## 2011-08-22 LAB — APTT: aPTT: 28

## 2011-08-26 ENCOUNTER — Other Ambulatory Visit: Payer: Self-pay | Admitting: Internal Medicine

## 2011-08-27 ENCOUNTER — Other Ambulatory Visit (INDEPENDENT_AMBULATORY_CARE_PROVIDER_SITE_OTHER): Payer: Self-pay | Admitting: General Surgery

## 2011-08-27 ENCOUNTER — Ambulatory Visit
Admission: RE | Admit: 2011-08-27 | Discharge: 2011-08-27 | Disposition: A | Discharge status: Home / Self Care | Payer: Medicare Other | Source: Ambulatory Visit | Attending: General Surgery | Admitting: General Surgery

## 2011-08-27 DIAGNOSIS — Z853 Personal history of malignant neoplasm of breast: Secondary | ICD-10-CM

## 2011-08-27 DIAGNOSIS — Z9889 Other specified postprocedural states: Secondary | ICD-10-CM

## 2011-09-19 ENCOUNTER — Other Ambulatory Visit: Payer: Self-pay | Admitting: Internal Medicine

## 2011-09-19 DIAGNOSIS — T887XXA Unspecified adverse effect of drug or medicament, initial encounter: Secondary | ICD-10-CM

## 2011-09-19 DIAGNOSIS — E785 Hyperlipidemia, unspecified: Secondary | ICD-10-CM

## 2011-09-20 ENCOUNTER — Other Ambulatory Visit (INDEPENDENT_AMBULATORY_CARE_PROVIDER_SITE_OTHER): Payer: Medicare Other

## 2011-09-20 DIAGNOSIS — T887XXA Unspecified adverse effect of drug or medicament, initial encounter: Secondary | ICD-10-CM

## 2011-09-20 DIAGNOSIS — E785 Hyperlipidemia, unspecified: Secondary | ICD-10-CM

## 2011-09-20 LAB — HEPATIC FUNCTION PANEL
ALT: 15 U/L (ref 0–35)
Albumin: 3.7 g/dL (ref 3.5–5.2)
Alkaline Phosphatase: 76 U/L (ref 39–117)
Total Protein: 7.3 g/dL (ref 6.0–8.3)

## 2011-09-20 LAB — LIPID PANEL
Cholesterol: 172 mg/dL (ref 0–200)
LDL Cholesterol: 101 mg/dL — ABNORMAL HIGH (ref 0–99)
Triglycerides: 114 mg/dL (ref 0.0–149.0)

## 2011-09-20 NOTE — Progress Notes (Signed)
12  

## 2011-09-25 ENCOUNTER — Encounter (INDEPENDENT_AMBULATORY_CARE_PROVIDER_SITE_OTHER): Payer: Self-pay | Admitting: General Surgery

## 2011-10-03 ENCOUNTER — Other Ambulatory Visit: Payer: Self-pay | Admitting: *Deleted

## 2011-10-03 MED ORDER — ROSUVASTATIN CALCIUM 20 MG PO TABS: 20.0000 mg | ORAL_TABLET | Freq: Every day | ORAL | Status: DC

## 2011-10-03 NOTE — Telephone Encounter (Signed)
Rx sent to pharmacy   

## 2011-10-08 ENCOUNTER — Other Ambulatory Visit: Payer: Medicare Other

## 2011-10-11 ENCOUNTER — Encounter: Payer: Self-pay | Admitting: Internal Medicine

## 2011-10-11 ENCOUNTER — Ambulatory Visit (INDEPENDENT_AMBULATORY_CARE_PROVIDER_SITE_OTHER): Payer: Medicare Other | Admitting: Internal Medicine

## 2011-10-11 DIAGNOSIS — G5600 Carpal tunnel syndrome, unspecified upper limb: Secondary | ICD-10-CM

## 2011-10-11 DIAGNOSIS — IMO0001 Reserved for inherently not codable concepts without codable children: Secondary | ICD-10-CM

## 2011-10-11 DIAGNOSIS — I10 Essential (primary) hypertension: Secondary | ICD-10-CM

## 2011-10-11 DIAGNOSIS — E785 Hyperlipidemia, unspecified: Secondary | ICD-10-CM

## 2011-10-11 LAB — TSH: TSH: 2.28 u[IU]/mL (ref 0.35–5.50)

## 2011-10-11 LAB — CREATININE, SERUM: Creatinine, Ser: 0.7 mg/dL (ref 0.4–1.2)

## 2011-10-11 LAB — MICROALBUMIN / CREATININE URINE RATIO: Creatinine,U: 28.9 mg/dL

## 2011-10-11 NOTE — Patient Instructions (Signed)
Annual podiatry and retinal assessments are indicated because of the diabetes.  Eat a low-fat diet with lots of fruits and vegetables, up to 7-9 servings per day. Avoid obesity; your goal is waist measurement < 40 inches.Consume less than 40 grams of sugar per day from foods & drinks with High Fructose Corn Sugar as #1,2,3 or # 4 on label. Follow the low carb nutrition program in [employer] as closely as possible to prevent Diabetes progression & complications. White carbohydrates (potatoes, rice, bread, and pasta) have a high spike of sugar and a high load of sugar. For example a  baked potato has a cup of sugar and a  french fry  2 teaspoons of sugar. Yams, wild  rice, whole grained bread &  wheat pasta have been much lower spike and load of  sugar. Portions should be the size of a deck of cards or your palm.  If her thyroid function tests are normal; the numbness in the hands should improve using wrist splints and continuing your weight loss program.

## 2011-10-11 NOTE — Progress Notes (Signed)
Subjective:    Patient ID: [name], female    DOB: 12-05-1940, 70 y.o.   MRN: [id number]  HPI Diabetes status assessment: Fasting or morning glucose range:  120-162  Highest glucose 2 hours after any meal:  Not measured. Hypoglycemia :  no .                                                     Excess thirst :no;  Excess hunger:  no ;  Excess urination:  no.                                  Lightheadedness with standing:  no. Chest pain:  no ; Palpitations :no ;  Pain in  calves with walking:  no .                                                                                                                                 Non healing skin  ulcers or sores,especially over the feet:  no. Numbness or tingling or burning in feet : no ; she does wake with numbness in her hands at times.                                                                                                                                               Significant change in  Weight : variable. Vision changes : no  .                                                                    Exercise : Elliptical 5X/ week & water aerobics X 2 . Nutrition/diet:  Decreased bread. Medication compliance : yes. Eye exam : 01/12; no retinopathy. Foot care : seen this year.  A1c/ urine microalbumin monitor:  7.3% in 8/12 .  Lipids were reviewed; they are  essentially @ goal    Review of Systems     Objective:   Physical Exam Gen.:  well-nourished, appropriate and alert, weight excess Eyes: No lid/conjunctival changes, extraocular motion intact Neck: Normal  thyroid Respiratory: No increased work of breathing or abnormal breath sounds Cardiac : regular rhythm, no extra heart sounds, gallop. 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 Thyroid status should be checked. Wrist splints are an option at night  Plan: See orders and recommendations

## 2011-11-07 ENCOUNTER — Encounter (HOSPITAL_COMMUNITY): Payer: Self-pay | Admitting: Emergency Medicine

## 2011-11-07 ENCOUNTER — Emergency Department (HOSPITAL_COMMUNITY)
Admission: EM | Admit: 2011-11-07 | Discharge: 2011-11-07 | Disposition: A | Discharge status: Home / Self Care | Payer: Medicare Other | Attending: Emergency Medicine | Admitting: Emergency Medicine

## 2011-11-07 ENCOUNTER — Emergency Department (HOSPITAL_COMMUNITY): Payer: Medicare Other

## 2011-11-07 ENCOUNTER — Other Ambulatory Visit: Payer: Self-pay

## 2011-11-07 DIAGNOSIS — I509 Heart failure, unspecified: Secondary | ICD-10-CM | POA: Insufficient documentation

## 2011-11-07 DIAGNOSIS — R5381 Other malaise: Secondary | ICD-10-CM | POA: Insufficient documentation

## 2011-11-07 DIAGNOSIS — E669 Obesity, unspecified: Secondary | ICD-10-CM | POA: Insufficient documentation

## 2011-11-07 DIAGNOSIS — E119 Type 2 diabetes mellitus without complications: Secondary | ICD-10-CM | POA: Insufficient documentation

## 2011-11-07 DIAGNOSIS — E785 Hyperlipidemia, unspecified: Secondary | ICD-10-CM | POA: Insufficient documentation

## 2011-11-07 DIAGNOSIS — K219 Gastro-esophageal reflux disease without esophagitis: Secondary | ICD-10-CM | POA: Insufficient documentation

## 2011-11-07 DIAGNOSIS — I10 Essential (primary) hypertension: Secondary | ICD-10-CM | POA: Insufficient documentation

## 2011-11-07 DIAGNOSIS — Z954 Presence of other heart-valve replacement: Secondary | ICD-10-CM | POA: Insufficient documentation

## 2011-11-07 DIAGNOSIS — R0602 Shortness of breath: Secondary | ICD-10-CM | POA: Insufficient documentation

## 2011-11-07 LAB — DIFFERENTIAL
Basophils Absolute: 0 10*3/uL (ref 0.0–0.1)
Basophils Relative: 0 % (ref 0–1)
Neutro Abs: 7.4 10*3/uL (ref 1.7–7.7)
Neutrophils Relative %: 83 % — ABNORMAL HIGH (ref 43–77)

## 2011-11-07 LAB — COMPREHENSIVE METABOLIC PANEL
AST: 16 U/L (ref 0–37)
Albumin: 3.4 g/dL — ABNORMAL LOW (ref 3.5–5.2)
Alkaline Phosphatase: 94 U/L (ref 39–117)
Chloride: 100 mEq/L (ref 96–112)
Potassium: 4 mEq/L (ref 3.5–5.1)
Total Bilirubin: 0.3 mg/dL (ref 0.3–1.2)

## 2011-11-07 LAB — CBC
MCHC: 32.7 g/dL (ref 30.0–36.0)
Platelets: 233 10*3/uL (ref 150–400)
RDW: 14.6 % (ref 11.5–15.5)

## 2011-11-07 LAB — CARDIAC PANEL(CRET KIN+CKTOT+MB+TROPI)
CK, MB: 3.8 ng/mL (ref 0.3–4.0)
Relative Index: 3.6 — ABNORMAL HIGH (ref 0.0–2.5)

## 2011-11-07 LAB — PRO B NATRIURETIC PEPTIDE: Pro B Natriuretic peptide (BNP): 530.6 pg/mL — ABNORMAL HIGH (ref 0–125)

## 2011-11-07 MED ORDER — FUROSEMIDE 10 MG/ML IJ SOLN
40.0000 mg | Freq: Once | INTRAMUSCULAR | Status: AC
  Administered 2011-11-07: 40 mg via INTRAVENOUS
  Filled 2011-11-07: qty 4

## 2011-11-07 NOTE — ED Notes (Signed)
[id number]<JY> Expected date:<BR> Expected time:<BR> Means of arrival:<BR> Comments:<BR> EMS, Elderly Generalized Weakness

## 2011-11-07 NOTE — ED Provider Notes (Addendum)
History     CSN: [id number]  Arrival date & time 11/07/11  2841   First MD Initiated Contact with Patient 11/07/11 0719      Chief Complaint  Patient presents with  . Weakness    Weakness starting today. hx of CHF    (Consider location/radiation/quality/duration/timing/severity/associated sxs/prior treatment) HPI Comments: Patient woke this morning and went to gym, then began feeling very weak, tired, no energy.  Became short of breath just trying to walk up steps.  Has history of aortic valve replacement about two years ago.  States she has had chf in the past and this feels the same.  No fevers or cough.  No chest pain. No history of CAD.  Patient is a 71 y.o. female presenting with weakness. The history is provided by the patient.  Weakness The symptoms are worsening. The neurological symptoms are diffuse.  Additional symptoms include weakness.    Past Medical History  Diagnosis Date  . Adenomatous polyps   . Hemorrhoids, external   . Constipation, chronic   . Hypertension   . CHF (congestive heart failure)   . Aortic stenosis, moderate   . Hyperlipidemia   . Carotid bruit   . Osteoarthritis   . Edema   . Hemoptysis   . Cancer     BREAST-HX OF  . Dysmetabolic syndrome X   . Low back pain syndrome   . Diabetes mellitus     TYPE 2  . Anemia   . Obesity   . Radiation   . GERD (gastroesophageal reflux disease)     hiatal hernia    Past Surgical History  Procedure Date  . Colonoscopy     AUGUST 2009  . Abdominal hysterectomy   . Breast lumpectomy     2009  . Tubal ligation   . Polypectomy     2004  . Replacement total knee     BILATERAL  . Aortic valve replacement   . Breast recision     LEFT BREAST RECISION OF INFERIOR MARGIN    Family History  Problem Relation Age of Onset  . Stroke Mother   . Aneurysm Mother   . Kidney disease Father   . Diabetes Sister   . Diabetes Brother   . Heart disease Brother   . Diabetes Sister   . Diabetes Brother     . Colon cancer Other   . Colon polyps Other     History  Substance Use Topics  . Smoking status: Former Games developer  . Smokeless tobacco: Not on file  . Alcohol Use: No    OB History    Grav Para Term Preterm Abortions TAB SAB Ect Mult Living                  Review of Systems  Neurological: Positive for weakness.  All other systems reviewed and are negative.    Allergies  Septra  Home Medications   Current Outpatient Rx  Name Route Sig Dispense Refill  . ASPIRIN 81 MG PO TABS Oral Take 81 mg by mouth daily.      Marland Kitchen CALCIUM-MAGNESIUM 500-250 MG PO TABS Oral Take by mouth.      . CHROMIUM 200 MCG PO CAPS Oral Take 1 each by mouth daily.      . FUROSEMIDE 40 MG PO TABS Oral Take 40 mg by mouth 2 (two) times daily.      Marland Kitchen JANUMET 50-1000 MG PO TABS  TAKE ONE TABLET BY MOUTH TWICE DAILY WITH  LARGEST MEALS 60 each 0  . KLOR-CON M20 20 MEQ PO TBCR  TAKE ONE TABLET BY MOUTH EVERY DAY , MAY TAKE  ANOTHER TABLET AS NEEDED WHEN  SECOND  FUROSEMIDE IS TAKEN 45 each 6  . ONETOUCH ULTRASOFT LANCETS MISC  USE AS DIRECTED 100 each 3    Approved on 08/16/11  . LOSARTAN POTASSIUM 100 MG PO TABS Oral Take 1 tablet (100 mg total) by mouth daily. 90 tablet 2  . MOMETASONE FURO-FORMOTEROL FUM 200-5 MCG/ACT IN AERO Inhalation Inhale 2 Inhalers into the lungs 2 (two) times daily as needed.      Marland Kitchen ONE-DAILY MULTI VITAMINS PO TABS Oral Take 1 tablet by mouth daily.      . ONETOUCH ULTRA BLUE VI STRP  USE AS DIRECTED 100 each 3  . RANITIDINE HCL 150 MG PO TABS Oral Take 150 mg by mouth daily.     Marland Kitchen ROSUVASTATIN CALCIUM 20 MG PO TABS Oral Take 1 tablet (20 mg total) by mouth daily. 30 tablet 2  . TRAMADOL HCL 50 MG PO TABS Oral Take 50 mg by mouth every 6 (six) hours as needed.        BP 165/59  Pulse 73  Temp(Src) 98.5 F (36.9 C) (Oral)  Resp 22  SpO2 93%  Physical Exam  Nursing note and vitals reviewed. Constitutional: She is oriented to person, place, and time. She appears well-developed  and well-nourished. No distress.  HENT:  Head: Normocephalic and atraumatic.  Neck: Normal range of motion. Neck supple.  Cardiovascular: Normal rate and regular rhythm.  Exam reveals no gallop and no friction rub.   No murmur heard. Pulmonary/Chest: Effort normal and breath sounds normal. No respiratory distress. She has no wheezes. She has no rales.  Abdominal: Soft. Bowel sounds are normal. She exhibits no distension. There is no tenderness.  Musculoskeletal: Normal range of motion. She exhibits edema.       There is 1+ edema in the ble.    Neurological: She is alert and oriented to person, place, and time.  Skin: Skin is warm and dry. She is not diaphoretic.    ED Course  Procedures (including critical care time)   Labs Reviewed  CBC  DIFFERENTIAL  COMPREHENSIVE METABOLIC PANEL  CARDIAC PANEL(CRET KIN+CKTOT+MB+TROPI)  PRO B NATRIURETIC PEPTIDE     No diagnosis found.   Date: 11/07/2011  Rate: 58  Rhythm: normal sinus rhythm and sinus bradycardia  QRS Axis: normal  Intervals: normal  ST/T Wave abnormalities: normal  Conduction Disutrbances:none  Narrative Interpretation:   Old EKG Reviewed: unchanged    MDM  Patient appears to be in chf clinically and based on test results.  Gave lasix, diuresed and feels better.  Will discharge to home with increased lasix for the next 5 days.  Has an appointment with Dr. Eden Emms next week.  Encouraged to keep this.        Geoffery Lyons, MD 11/07/11 1610  Geoffery Lyons, MD 11/07/11 [phone]

## 2011-11-07 NOTE — ED Notes (Signed)
Pt denies complaint at this time; states she got up this am to go to the gym and when she got there to exercise she became weak and short of breath; denies cp at any time; lung sounds clear; respirations unlabored and equal; husband at bedside.

## 2011-11-07 NOTE — ED Notes (Signed)
Pt ambulated to restroom, increased shortness of breath with ambulation; O2 sat following ambulation at 92% on RA; cardiac monitoring and O2 reapplied; pt O2 sat at 97% on 2L via [place]

## 2011-11-14 ENCOUNTER — Encounter: Payer: Self-pay | Admitting: Cardiovascular Disease

## 2011-11-14 ENCOUNTER — Ambulatory Visit (INDEPENDENT_AMBULATORY_CARE_PROVIDER_SITE_OTHER): Payer: Medicare Other | Admitting: Cardiovascular Disease

## 2011-11-14 DIAGNOSIS — Z952 Presence of prosthetic heart valve: Secondary | ICD-10-CM

## 2011-11-14 DIAGNOSIS — E785 Hyperlipidemia, unspecified: Secondary | ICD-10-CM

## 2011-11-14 DIAGNOSIS — Z79899 Other long term (current) drug therapy: Secondary | ICD-10-CM

## 2011-11-14 DIAGNOSIS — M545 Low back pain: Secondary | ICD-10-CM

## 2011-11-14 DIAGNOSIS — E119 Type 2 diabetes mellitus without complications: Secondary | ICD-10-CM

## 2011-11-14 DIAGNOSIS — I509 Heart failure, unspecified: Secondary | ICD-10-CM

## 2011-11-14 DIAGNOSIS — I359 Nonrheumatic aortic valve disorder, unspecified: Secondary | ICD-10-CM

## 2011-11-14 DIAGNOSIS — I10 Essential (primary) hypertension: Secondary | ICD-10-CM

## 2011-11-14 DIAGNOSIS — R0602 Shortness of breath: Secondary | ICD-10-CM

## 2011-11-14 LAB — BRAIN NATRIURETIC PEPTIDE: Pro B Natriuretic peptide (BNP): 170 pg/mL — ABNORMAL HIGH (ref 0.0–100.0)

## 2011-11-14 LAB — BASIC METABOLIC PANEL
CO2: 28 mEq/L (ref 19–32)
Calcium: 9.2 mg/dL (ref 8.4–10.5)
Chloride: 102 mEq/L (ref 96–112)
Sodium: 140 mEq/L (ref 135–145)

## 2011-11-14 NOTE — Assessment & Plan Note (Signed)
Diastolic from stiff heart and smallish AVR.  Improved with higher dose lasix.  Weight down 5 lbs.  BMET /BNP and F/U echo  Patient known how to adjust lasix dose for weight and symptoms.  NO evidence of ischemia and doubt change in valve by exam no AR

## 2011-11-14 NOTE — Assessment & Plan Note (Signed)
S/P AVR tissue valve 1/11  Echo make sure mean gradient not increasing  Contibutes to diastolic dysfuntion

## 2011-11-14 NOTE — Assessment & Plan Note (Signed)
Cholesterol is at goal.  Continue current dose of statin and diet Rx.  No myalgias or side effects.  F/U  LFT's in 6 months. Lab Results  Component Value Date   LDLCALC 101* 09/20/2011             

## 2011-11-14 NOTE — Progress Notes (Signed)
Patient ID: [name], female   DOB: 26-Nov-1940, 71 y.o.   MRN: [id number] Delightful 71 yo [place] female post tissue AVR.  Echo 2011 normal EF with some annular valve mismatch mean gradient no AR.  Always been heavy but goes to the gym regularly and tries to exercise.  Seen in ER 1/4 with CHF improved with Lasix.  Continues to have exertional dsypnea with mild chronic LE edema.  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  Compliant with meds.  Weight stable around 269.  Some salt indiscretion with middle [place] diet.  No pal;pitations, or syncope  ROS: Denies fever, malais, weight loss, blurry vision, decreased visual acuity, cough, sputum, SOB, hemoptysis, pleuritic pain, palpitaitons, heartburn, abdominal pain, melena, lower extremity edema, claudication, or rash.  All other systems reviewed and negative  Reviewed echo 6/11 Reviewed ER notes 1/13 Reviewed CXR 11/09/11  Moderate CHF atelectasis  General: Affect appropriate Healthy:  appears stated age overweight HEENT: normal Neck supple with no adenopathy JVP normal no bruits no thyromegaly Lungs clear with no wheezing and good diaphragmatic motion Heart:  S1/S2 jsystolic  Murmur no ,rub, gallop or click PMI normal Abdomen: benighn, BS positve, no tenderness, no AAA no bruit.  No HSM or HJR Distal pulses intact with no bruits Trace bilateral edema Neuro non-focal Skin warm and dry No muscular weakness S/P right TKR   Current Outpatient Prescriptions  Medication Sig Dispense Refill  . aspirin 81 MG tablet Take 81 mg by mouth daily.        . Calcium-Magnesium 500-250 MG TABS Take by mouth.        . Chromium 200 MCG CAPS Take 1 each by mouth daily.        . furosemide (LASIX) 40 MG tablet Take 40 mg by mouth 2 (two) times daily.        Marland Kitchen JANUMET 50-1000 MG per tablet TAKE ONE TABLET BY MOUTH TWICE DAILY WITH LARGEST MEALS  60 each  0  . KLOR-CON M20 20 MEQ tablet TAKE ONE TABLET BY MOUTH EVERY DAY , MAY TAKE   ANOTHER TABLET AS NEEDED WHEN  SECOND  FUROSEMIDE IS TAKEN  45 each  6  . Lancets (ONETOUCH ULTRASOFT) lancets USE AS DIRECTED  100 each  3  . losartan (COZAAR) 100 MG tablet Take 1 tablet (100 mg total) by mouth daily.  90 tablet  2  . Mometasone Furo-Formoterol Fum (DULERA) 200-5 MCG/ACT AERO Inhale 2 Inhalers into the lungs 2 (two) times daily as needed.        . Multiple Vitamin (MULTIVITAMIN) tablet Take 1 tablet by mouth daily.        . ONE TOUCH ULTRA TEST test strip USE AS DIRECTED  100 each  3  . ranitidine (ZANTAC) 150 MG tablet Take 150 mg by mouth daily.       . rosuvastatin (CRESTOR) 20 MG tablet Take 1 tablet (20 mg total) by mouth daily.  30 tablet  2  . traMADol (ULTRAM) 50 MG tablet Take 50 mg by mouth every 6 (six) hours as needed.          Allergies  Septra  Electrocardiogram:  11/06/10  NSR poor R wave progression otherwise normal ECG  Assessment and Plan

## 2011-11-14 NOTE — Patient Instructions (Signed)
Your physician recommends that you schedule a follow-up appointment in: 6-8 WEEKS WITH DR [facility] Your physician recommends that you continue on your current medications as directed. Please refer to the Current Medication list given to you today. Your physician recommends that you return for lab work in: TODAY  BMET BNP DX V58.69  SHORTNESS OF BREATH Your physician has requested that you have an echocardiogram. Echocardiography is a painless test that uses sound waves to create images of your heart. It provides your doctor with information about the size and shape of your heart and how well your heart's chambers and valves are working. This procedure takes approximately one hour. There are no restrictions for this procedure. DX AVR

## 2011-11-14 NOTE — Assessment & Plan Note (Signed)
Discussed low carb diet.  Target hemoglobin A1c is 6.5 or less.  Continue current medications.  

## 2011-11-14 NOTE — Assessment & Plan Note (Signed)
Continue tramadol and PT  Given obesity, AVR, dyspnea and arthritis filled out forms for handicap sticker for car.

## 2011-11-14 NOTE — Assessment & Plan Note (Signed)
Well controlled.  Continue current medications and low sodium Dash type diet.    

## 2011-11-19 ENCOUNTER — Other Ambulatory Visit: Payer: Self-pay | Admitting: Cardiovascular Disease

## 2011-11-19 ENCOUNTER — Other Ambulatory Visit: Payer: Self-pay | Admitting: Internal Medicine

## 2011-11-21 ENCOUNTER — Ambulatory Visit (HOSPITAL_COMMUNITY): Payer: Medicare Other | Attending: Cardiovascular Disease

## 2011-11-21 DIAGNOSIS — E785 Hyperlipidemia, unspecified: Secondary | ICD-10-CM | POA: Insufficient documentation

## 2011-11-21 DIAGNOSIS — R0609 Other forms of dyspnea: Secondary | ICD-10-CM | POA: Insufficient documentation

## 2011-11-21 DIAGNOSIS — I10 Essential (primary) hypertension: Secondary | ICD-10-CM | POA: Insufficient documentation

## 2011-11-21 DIAGNOSIS — I359 Nonrheumatic aortic valve disorder, unspecified: Secondary | ICD-10-CM

## 2011-11-21 DIAGNOSIS — R0989 Other specified symptoms and signs involving the circulatory and respiratory systems: Secondary | ICD-10-CM | POA: Insufficient documentation

## 2011-11-21 DIAGNOSIS — I509 Heart failure, unspecified: Secondary | ICD-10-CM | POA: Insufficient documentation

## 2011-11-21 DIAGNOSIS — Z952 Presence of prosthetic heart valve: Secondary | ICD-10-CM | POA: Insufficient documentation

## 2011-12-10 ENCOUNTER — Other Ambulatory Visit: Payer: Self-pay | Admitting: Internal Medicine

## 2011-12-11 ENCOUNTER — Encounter: Payer: Self-pay | Admitting: Cardiovascular Disease

## 2011-12-26 ENCOUNTER — Ambulatory Visit: Payer: Medicare Other | Admitting: Cardiovascular Disease

## 2012-01-02 ENCOUNTER — Ambulatory Visit (INDEPENDENT_AMBULATORY_CARE_PROVIDER_SITE_OTHER): Payer: Medicare Other | Admitting: Cardiovascular Disease

## 2012-01-02 ENCOUNTER — Encounter: Payer: Self-pay | Admitting: Cardiovascular Disease

## 2012-01-02 DIAGNOSIS — I509 Heart failure, unspecified: Secondary | ICD-10-CM

## 2012-01-02 DIAGNOSIS — E785 Hyperlipidemia, unspecified: Secondary | ICD-10-CM

## 2012-01-02 DIAGNOSIS — E119 Type 2 diabetes mellitus without complications: Secondary | ICD-10-CM

## 2012-01-02 DIAGNOSIS — I359 Nonrheumatic aortic valve disorder, unspecified: Secondary | ICD-10-CM

## 2012-01-02 DIAGNOSIS — I10 Essential (primary) hypertension: Secondary | ICD-10-CM

## 2012-01-02 NOTE — Assessment & Plan Note (Signed)
Improved with less edema and low BNP Continue current dose of diuretic and patient knows how to use extra for weight gain

## 2012-01-02 NOTE — Assessment & Plan Note (Signed)
Well controlled.  Continue current medications and low sodium Dash type diet.    

## 2012-01-02 NOTE — Patient Instructions (Signed)
Your physician recommends that you schedule a follow-up appointment in:  3 MONTHS WITH  DR NISHAN  Your physician recommends that you continue on your current medications as directed. Please refer to the Current Medication list given to you today.  

## 2012-01-02 NOTE — Assessment & Plan Note (Signed)
Cholesterol is at goal.  Continue current dose of statin and diet Rx.  No myalgias or side effects.  F/U  LFT's in 6 months. Lab Results  Component Value Date   LDLCALC 101* 09/20/2011             

## 2012-01-02 NOTE — Assessment & Plan Note (Signed)
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   Discussed this with patient

## 2012-01-02 NOTE — Progress Notes (Signed)
Delightful 71 yo [place] female post tissue AVR. Echo 2011 normal EF with some annular valve mismatch mean gradient no AR. Always been heavy but goes to the gym regularly and tries to exercise. Seen in ER 1/4 with CHF improved with Lasix. Continues to have exertional dsypnea with mild chronic LE edema. 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 Compliant with meds. Weight stable around 269. Some salt indiscretion with middle [place] diet. No pal;pitations, or syncope  ROS: Denies fever, malais, weight loss, blurry vision, decreased visual acuity, cough, sputum, SOB, hemoptysis, pleuritic pain, palpitaitons, heartburn, abdominal pain, melena, lower extremity edema, claudication, or rash. All other systems reviewed and negative  Reviewed echo 6/11  Reviewed ER notes 1/13  Reviewed CXR 11/09/11 Moderate CHF atelectasis  Dyspnea improved.  Did elliptical for an hour this am and swam.  Her last BNP was only 170  ROS: Denies fever, malais, weight loss, blurry vision, decreased visual acuity, cough, sputum, SOB, hemoptysis, pleuritic pain, palpitaitons, heartburn, abdominal pain, melena, lower extremity edema, claudication, or rash.  All other systems reviewed and negative  General: Affect appropriate Jovial overweight [place] female HEENT: normal Neck supple with no adenopathy JVP normal no bruits no thyromegaly Lungs clear with no wheezing and good diaphragmatic motion Heart:  S1/S2 click SEM  murmur, no rub, gallop or click PMI normal Abdomen: benighn, BS positve, no tenderness, no AAA no bruit.  No HSM or HJR Distal pulses intact with no bruits No edema Neuro non-focal Skin warm and dry No muscular weakness   Current Outpatient Prescriptions  Medication Sig Dispense Refill  . aspirin 81 MG tablet Take 81 mg by mouth daily.        . Calcium-Magnesium 500-250 MG TABS Take 1 tablet by mouth daily.       . Chromium 200 MCG CAPS Take 1 each by mouth daily.        .  furosemide (LASIX) 40 MG tablet Take 1 tablet (40 mg total) by mouth 2 (two) times daily.  60 tablet  11  . JANUMET 50-1000 MG per tablet TAKE ONE TABLET BY MOUTH TWICE DAILY WITH LARGEST MEALS  60 each  2  . KLOR-CON M20 20 MEQ tablet TAKE ONE TABLET BY MOUTH EVERY DAY , MAY TAKE  ANOTHER TABLET AS NEEDED WHEN  SECOND  FUROSEMIDE IS TAKEN  45 each  6  . Lancets (ONETOUCH ULTRASOFT) lancets USE AS DIRECTED  100 each  3  . losartan (COZAAR) 100 MG tablet TAKE ONE TABLET BY MOUTH EVERY DAY  90 tablet  2  . Mometasone Furo-Formoterol Fum (DULERA) 200-5 MCG/ACT AERO Inhale 2 Inhalers into the lungs 2 (two) times daily as needed.        . Multiple Vitamin (MULTIVITAMIN) tablet Take 1 tablet by mouth daily.        . ONE TOUCH ULTRA TEST test strip USE AS DIRECTED  100 each  3  . ranitidine (ZANTAC) 150 MG tablet Take 150 mg by mouth daily.       . rosuvastatin (CRESTOR) 20 MG tablet Take 1 tablet (20 mg total) by mouth daily.  30 tablet  2  . traMADol (ULTRAM) 50 MG tablet Take 50 mg by mouth every 6 (six) hours as needed.          Allergies  Septra  Electrocardiogram:  Assessment and Plan

## 2012-01-02 NOTE — Assessment & Plan Note (Signed)
Discussed low carb diet.  Target hemoglobin A1c is 6.5 or less.  Continue current medications.  

## 2012-01-07 ENCOUNTER — Other Ambulatory Visit: Payer: Self-pay | Admitting: Cardiovascular Disease

## 2012-01-07 NOTE — Telephone Encounter (Signed)
Refill   Patient request 90 day supply for refills, Verified Preferred pharmacy [facility] on [address]     furosemide (LASIX) 40 MG tablet  potassium chloride SA (KLOR-CON M20) 20 MEQ tablet   Patient can be reached at hm# if there are any additional questions

## 2012-01-08 MED ORDER — POTASSIUM CHLORIDE CRYS ER 20 MEQ PO TBCR: EXTENDED_RELEASE_TABLET | ORAL | Status: DC

## 2012-01-08 MED ORDER — FUROSEMIDE 40 MG PO TABS: 40.0000 mg | ORAL_TABLET | Freq: Two times a day (BID) | ORAL | Status: DC

## 2012-01-21 ENCOUNTER — Other Ambulatory Visit: Payer: Self-pay | Admitting: Cardiovascular Disease

## 2012-01-21 ENCOUNTER — Telehealth: Payer: Self-pay

## 2012-01-21 MED ORDER — SITAGLIPTIN PHOS-METFORMIN HCL 50-1000 MG PO TABS: 1.0000 | ORAL_TABLET | Freq: Two times a day (BID) | ORAL | Status: DC

## 2012-01-21 NOTE — Telephone Encounter (Signed)
New Msg: Pt calling needing refill of potassium called into [facility] on [address]. Pt requesting THREE MONTH SUPPLY.

## 2012-01-21 NOTE — Telephone Encounter (Signed)
Refilled on 01/08/12

## 2012-01-21 NOTE — Telephone Encounter (Signed)
Request from patient--labs current and Rx faxed     KP

## 2012-01-28 ENCOUNTER — Telehealth: Payer: Self-pay | Admitting: Cardiovascular Disease

## 2012-01-28 MED ORDER — POTASSIUM CHLORIDE CRYS ER 20 MEQ PO TBCR: 20.0000 meq | EXTENDED_RELEASE_TABLET | Freq: Two times a day (BID) | ORAL | Status: DC

## 2012-01-28 NOTE — Telephone Encounter (Signed)
Please return call to patient regarding Potassium RX, she has questions regarding instructions.  She can be reached at [phone]

## 2012-01-28 NOTE — Telephone Encounter (Signed)
REFILL SENT TO [facility] AT PT'S REQUEST  IS CURRENTLY TAKING KCL 20 MEQ  2 TABS EVERY DAY .Zack Seal

## 2012-03-04 ENCOUNTER — Other Ambulatory Visit: Payer: Self-pay | Admitting: Internal Medicine

## 2012-03-17 ENCOUNTER — Telehealth: Payer: Self-pay | Admitting: Internal Medicine

## 2012-03-17 ENCOUNTER — Ambulatory Visit (INDEPENDENT_AMBULATORY_CARE_PROVIDER_SITE_OTHER): Payer: Medicare Other | Admitting: Internal Medicine

## 2012-03-17 ENCOUNTER — Encounter: Payer: Self-pay | Admitting: Internal Medicine

## 2012-03-17 VITALS — BP 130/82 | HR 56 | Temp 97.6°F | Wt 268.0 lb

## 2012-03-17 DIAGNOSIS — L259 Unspecified contact dermatitis, unspecified cause: Secondary | ICD-10-CM

## 2012-03-17 DIAGNOSIS — L309 Dermatitis, unspecified: Secondary | ICD-10-CM

## 2012-03-17 MED ORDER — HYDROCORTISONE 2.5 % EX CREA: TOPICAL_CREAM | Freq: Two times a day (BID) | CUTANEOUS | Status: AC

## 2012-03-17 MED ORDER — KETOCONAZOLE 2 % EX CREA: TOPICAL_CREAM | Freq: Every day | CUTANEOUS | Status: AC

## 2012-03-17 NOTE — Progress Notes (Signed)
  Subjective:    Patient ID: [name], female    DOB: 09-Jun-1941, 71 y.o.   MRN: [id number]  HPI Acute visit For 3 or 4 days, she is having an itchy rash on the hands, neck, under her breast and groin. The rash is not painful. Her left eyelids were quite swollen until yesterday, they are better now without any iintervention. She thinks her daughter has something similar.  Past Medical History  Diagnosis Date  . Adenomatous polyps   . Hemorrhoids, external   . Constipation, chronic   . Hypertension   . CHF (congestive heart failure)   . Aortic stenosis, moderate   . Hyperlipidemia   . Carotid bruit   . Osteoarthritis   . Edema   . Hemoptysis   . Cancer     BREAST-HX OF  . Dysmetabolic syndrome X   . Low back pain syndrome   . Diabetes mellitus     TYPE 2  . Anemia   . Obesity   . Radiation   . GERD (gastroesophageal reflux disease)     hiatal hernia     Review of Systems No fever or chills No recent yard work, she does not have pets. She's not taking any new medicines.     Objective:   Physical Exam  Constitutional: She appears well-developed. No distress.  Skin: She is not diaphoretic.          Has some erythema under the left breast, that specific area of the rash seems slightly macerated. also some redness in the right groin w/o maceration. The other lesions are at the extremities and neck are maculopapular, slightly erythematous. The left cheek is erythematous without much swelling. Eyes are not compromised        Assessment & Plan: Dermatitis, Dermatitis, very suggestive ma Der

## 2012-03-17 NOTE — Telephone Encounter (Signed)
Please advise 

## 2012-03-17 NOTE — Telephone Encounter (Signed)
Pt was seen today and forgot to ask Dr. Drue Novel if she can go to the pool. She goes swimming on Wednesdays. Please advise.

## 2012-03-17 NOTE — Telephone Encounter (Signed)
yes

## 2012-03-17 NOTE — Assessment & Plan Note (Addendum)
Dermatitis on the neck and extremities highly suggestive of contact dermatitis however she has not been outside in the yard. Other considerations are scabies. Plan:  Treat as contact dermatitis, if not better she will let me know. Rash at the right groin and under the breast seems rather fungal. Will prescribe ketoconazole Addendum, on further questioning, her husband has been clearing the yard from poison ivy, recommend all husband's clothing to be washed

## 2012-03-17 NOTE — Telephone Encounter (Signed)
Spoke with patient, patient ok'd information.  

## 2012-03-17 NOTE — Patient Instructions (Signed)
Benadryl as needed for itching Hydrocortisone cream, apply twice a day to every area, do not apply close to the eye. Ketoconazole, apply twice a day only at the left breast and right groin.   Call anytime if fever or if the rash gets worse. Please schedule a routine visit next week.

## 2012-03-26 ENCOUNTER — Ambulatory Visit: Payer: Medicare Other | Admitting: Internal Medicine

## 2012-04-01 ENCOUNTER — Ambulatory Visit (INDEPENDENT_AMBULATORY_CARE_PROVIDER_SITE_OTHER): Payer: Medicare Other | Admitting: Internal Medicine

## 2012-04-01 ENCOUNTER — Encounter: Payer: Self-pay | Admitting: Internal Medicine

## 2012-04-01 VITALS — BP 126/80 | HR 76 | Temp 98.4°F | Wt 271.6 lb

## 2012-04-01 DIAGNOSIS — IMO0001 Reserved for inherently not codable concepts without codable children: Secondary | ICD-10-CM

## 2012-04-01 DIAGNOSIS — E119 Type 2 diabetes mellitus without complications: Secondary | ICD-10-CM

## 2012-04-01 DIAGNOSIS — L259 Unspecified contact dermatitis, unspecified cause: Secondary | ICD-10-CM

## 2012-04-01 LAB — MICROALBUMIN / CREATININE URINE RATIO: Microalb, Ur: 0.8 mg/dL (ref 0.0–1.9)

## 2012-04-01 LAB — HEMOGLOBIN A1C: Hgb A1c MFr Bld: 6.7 % — ABNORMAL HIGH (ref 4.6–6.5)

## 2012-04-01 NOTE — Patient Instructions (Signed)
Use Eucerin or Aveeno Daily  Moisturizing Lotion  twice a day  for  dry skin. Bathe with moisturizing liquid soap , not bar soap.  Mix Eucerin 1 part to Cortisone cream 1 part and apply  Twice a day to  skin if itching .  Take Allegra 160 mg a day; this is a nonsedating antihistamine or take Zyrtec at bedtime,this tends to be sedating for allergic symptoms. Please try to go on My Chart within the next 24 hours to allow me to release the results directly to you.

## 2012-04-01 NOTE — Progress Notes (Signed)
  Subjective:    Patient ID: [name], female    DOB: [birth year], 71 y.o.   MRN: [id number]  HPI The rash for which she was seen 03/17/12 did improve with the kidney, small and topical steroid. The swelling around the left eye resolved slowly; the only treatment was Benadryl.  She had a recurrent pruritic pink rash on 5/20 involving the right lateral abdomen as well as a left. This has improved with the topical steroid. She denies fever, chills, or sweats.    Review of Systems She has had no discharge from the left eye. There's been no blurred vision, double vision or loss of vision.  She has had some intermittent wheezing especially at night without associated itchy, watery eyes or sneezing. Elwin Sleight has been of benefit in controlling reactive airways disease.  Fasting blood sugars have ranged from 140-163. Her last A1c was 6.8% in December 2012     Objective:   Physical Exam General appearance:in no acute distress or increased work of breathing is present.  No  lymphadenopathy about the head, neck, or axilla noted.   Eyes: No conjunctival inflammation or lid edema is present. Vision intact.  Ears:  External ear exam shows no significant lesions or deformities.  Otoscopic examination reveals clear canals, tympanic membranes are intact bilaterally without bulging, retraction, inflammation or discharge.  Nose:  External nasal examination shows no deformity or inflammation. Nasal mucosa are pink and moist without lesions or exudates. No septal dislocation or deviation.No obstruction to airflow.   Oral exam: Upper plate; lips and gums are healthy appearing.There is no oropharyngeal erythema or exudate noted.     Heart:  Normal rate and regular rhythm. S1 and S2 normal without gallop,  click, rub .Grade 1/6 systolic murmur   Lungs:Chest essentially clear to auscultation minimal rales  present.No increased work of breathing.    Extremities:  No cyanosis, edema, or clubbing  noted     Skin: Warm & dry ; she has bland, faint hyperpigmentation over the lateral abdomen bilaterally.          Assessment & Plan:   #1 probable contact dermatitis with residual hyperpigmentation  #2 intertriginous fungal infection in the context of #3  #3 diabetes; level control will need to be assessed  Plan: Generic Allegra would be recommended rather than Benadryl for extrinsic symptoms. Generic Singulair will be added if symptoms persist  A1c will be checked

## 2012-04-02 ENCOUNTER — Encounter: Payer: Self-pay | Admitting: Cardiovascular Disease

## 2012-04-02 ENCOUNTER — Ambulatory Visit (INDEPENDENT_AMBULATORY_CARE_PROVIDER_SITE_OTHER): Payer: Medicare Other | Admitting: Cardiovascular Disease

## 2012-04-02 VITALS — BP 128/80 | HR 64 | Wt 268.0 lb

## 2012-04-02 DIAGNOSIS — I10 Essential (primary) hypertension: Secondary | ICD-10-CM

## 2012-04-02 DIAGNOSIS — E119 Type 2 diabetes mellitus without complications: Secondary | ICD-10-CM

## 2012-04-02 DIAGNOSIS — E785 Hyperlipidemia, unspecified: Secondary | ICD-10-CM

## 2012-04-02 DIAGNOSIS — I359 Nonrheumatic aortic valve disorder, unspecified: Secondary | ICD-10-CM

## 2012-04-02 NOTE — Assessment & Plan Note (Signed)
Cholesterol is at goal.  Continue current dose of statin and diet Rx.  No myalgias or side effects.  F/U  LFT's in 6 months. Lab Results  Component Value Date   LDLCALC 101* 09/20/2011             

## 2012-04-02 NOTE — Assessment & Plan Note (Signed)
Progressive gradients in AVR.  Not a candidate for redo.  She is active and limitations would appear to be from obesity.   F/U echo in 6 months

## 2012-04-02 NOTE — Progress Notes (Signed)
Patient ID: [name], female   DOB: May 23, 1941, 71 y.o.   MRN: [id number] Delightful 71 yo [place] female post tissue AVR. Echo 2011 normal EF with some annular valve mismatch mean gradient no AR. Always been heavy but goes to the gym regularly and tries to exercise. Seen in ER 1/4 with CHF improved with Lasix. Continues to have exertional dsypnea with mild chronic LE edema. 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 Compliant with meds. Weight stable around 269. Some salt indiscretion with middle [place] diet. No pal;pitations, or syncope  ROS: Denies fever, malais, weight loss, blurry vision, decreased visual acuity, cough, sputum, SOB, hemoptysis, pleuritic pain, palpitaitons, heartburn, abdominal pain, melena, lower extremity edema, claudication, or rash. All other systems reviewed and negative  Reviewed echo 11/21/11 mean gradient reported as  Increased from 2011  - Left ventricle: The cavity size was normal. Wall thickness was increased in a pattern of moderate LVH. Systolic function was normal. The estimated ejection fraction was in the range of 50% to 55%. Wall motion was normal; there were no regional wall motion abnormalities. Features are consistent with a pseudonormal left ventricular filling pattern, with concomitant abnormal relaxation and increased filling pressure (grade 2 diastolic dysfunction). - Aortic valve: Elevated velocities through porcine valve likely from small valve and valve root mismatch. Leflet mobility looked normal - Mitral valve: Calcified annulus. - Left atrium: The atrium was mildly dilated. - Atrial septum: No defect or patent foramen ovale was identified. - Pulmonary arteries: PA peak pressure: 32mm Hg (S).    Reviewed ER notes 1/13  Reviewed CXR 11/09/11 Moderate CHF atelectasis  Dyspnea improved. Did elliptical for an hour this am and swam. Her last BNP was only 170  ROS: Denies fever, malais, weight loss, blurry vision,  decreased visual acuity, cough, sputum, SOB, hemoptysis, pleuritic pain, palpitaitons, heartburn, abdominal pain, melena, lower extremity edema, claudication, or rash.  All other systems reviewed and negative  General: Affect appropriate Healthy:  appears stated age HEENT: normal Neck supple with no adenopathy JVP normal no bruits no thyromegaly Lungs clear with no wheezing and good diaphragmatic motion Heart:  S1/S2 split with AS  murmur, no rub, gallop or click PMI normal Abdomen: benighn, BS positve, no tenderness, no AAA no bruit.  No HSM or HJR Distal pulses intact with no bruits Trace bilateral edema Neuro non-focal Skin warm and dry No muscular weakness   Current Outpatient Prescriptions  Medication Sig Dispense Refill  . aspirin 81 MG tablet Take 81 mg by mouth daily.        . Calcium-Magnesium 500-250 MG TABS Take 1 tablet by mouth daily.       . Chromium 200 MCG CAPS Take 1 each by mouth daily.        . CRESTOR 20 MG tablet TAKE ONE TABLET BY MOUTH EVERY DAY  30 each  6  . furosemide (LASIX) 40 MG tablet Take 1 tablet (40 mg total) by mouth 2 (two) times daily.  180 tablet  3  . hydrocortisone 2.5 % cream Apply topically 2 (two) times daily.  60 g  0  . ketoconazole (NIZORAL) 2 % cream Apply topically daily.  60 g  0  . Lancets (ONETOUCH ULTRASOFT) lancets USE AS DIRECTED  100 each  3  . losartan (COZAAR) 100 MG tablet TAKE ONE TABLET BY MOUTH EVERY DAY  90 tablet  2  . Mometasone Furo-Formoterol Fum (DULERA) 200-5 MCG/ACT AERO Inhale 2 Inhalers into the lungs  2 (two) times daily as needed.        . Multiple Vitamin (MULTIVITAMIN) tablet Take 1 tablet by mouth daily.        . ONE TOUCH ULTRA TEST test strip USE AS DIRECTED  100 each  3  . potassium chloride SA (K-DUR,KLOR-CON) 20 MEQ tablet Take 1 tablet (20 mEq total) by mouth 2 (two) times daily.  180 tablet  3  . ranitidine (ZANTAC) 150 MG tablet Take 150 mg by mouth daily.       . sitaGLIPtan-metformin (JANUMET)  50-1000 MG per tablet Take 1 tablet by mouth 2 (two) times daily with a meal.  180 tablet  1  . traMADol (ULTRAM) 50 MG tablet Take 50 mg by mouth every 6 (six) hours as needed.          Allergies  Septra  Electrocardiogram:  Assessment and Plan

## 2012-04-02 NOTE — Patient Instructions (Signed)
Your physician wants you to follow-up in:  6 MONTHS WITH DR NISHAN  You will receive a reminder letter in the mail two months in advance. If you don't receive a letter, please call our office to schedule the follow-up appointment. Your physician recommends that you continue on your current medications as directed. Please refer to the Current Medication list given to you today. 

## 2012-04-02 NOTE — Assessment & Plan Note (Signed)
Discussed low carb diet.  Target hemoglobin A1c is 6.5 or less.  Continue current medications.  

## 2012-04-02 NOTE — Assessment & Plan Note (Signed)
Well controlled.  Continue current medications and low sodium Dash type diet.    

## 2012-04-29 ENCOUNTER — Other Ambulatory Visit: Payer: Self-pay | Admitting: Internal Medicine

## 2012-05-26 ENCOUNTER — Other Ambulatory Visit (HOSPITAL_COMMUNITY): Payer: Self-pay | Admitting: Cardiovascular Disease

## 2012-05-26 DIAGNOSIS — I359 Nonrheumatic aortic valve disorder, unspecified: Secondary | ICD-10-CM

## 2012-05-27 ENCOUNTER — Other Ambulatory Visit: Payer: Self-pay

## 2012-05-27 ENCOUNTER — Ambulatory Visit (HOSPITAL_COMMUNITY): Payer: Medicare Other | Attending: Cardiovascular Disease | Admitting: Radiology

## 2012-05-27 DIAGNOSIS — E119 Type 2 diabetes mellitus without complications: Secondary | ICD-10-CM | POA: Insufficient documentation

## 2012-05-27 DIAGNOSIS — E785 Hyperlipidemia, unspecified: Secondary | ICD-10-CM | POA: Insufficient documentation

## 2012-05-27 DIAGNOSIS — I10 Essential (primary) hypertension: Secondary | ICD-10-CM | POA: Insufficient documentation

## 2012-05-27 DIAGNOSIS — I079 Rheumatic tricuspid valve disease, unspecified: Secondary | ICD-10-CM | POA: Insufficient documentation

## 2012-05-27 DIAGNOSIS — I359 Nonrheumatic aortic valve disorder, unspecified: Secondary | ICD-10-CM

## 2012-05-27 DIAGNOSIS — I509 Heart failure, unspecified: Secondary | ICD-10-CM | POA: Insufficient documentation

## 2012-05-27 NOTE — Progress Notes (Signed)
Echocardiogram performed.  

## 2012-07-25 ENCOUNTER — Telehealth: Payer: Self-pay

## 2012-07-25 NOTE — Telephone Encounter (Signed)
Pt walked in requesting samples of Crestor and Janumet.  She has reached her doughnut hole and can not afford meds at [facility].  Samples given for one week of each per Chrae.  She is scheduled to see Dr. Alwyn Ren next week to further discuss.

## 2012-07-30 ENCOUNTER — Ambulatory Visit (INDEPENDENT_AMBULATORY_CARE_PROVIDER_SITE_OTHER): Payer: Medicare Other | Admitting: Internal Medicine

## 2012-07-30 ENCOUNTER — Encounter: Payer: Self-pay | Admitting: Internal Medicine

## 2012-07-30 VITALS — BP 132/84 | HR 71 | Temp 97.6°F | Wt 266.8 lb

## 2012-07-30 DIAGNOSIS — E119 Type 2 diabetes mellitus without complications: Secondary | ICD-10-CM

## 2012-07-30 LAB — HEMOGLOBIN A1C: Hgb A1c MFr Bld: 6.6 % — ABNORMAL HIGH (ref 4.6–6.5)

## 2012-07-30 NOTE — Progress Notes (Signed)
Subjective:    Patient ID: [name], female    DOB: 1940/12/07, 71 y.o.   MRN: [id number]  HPI Diabetes status assessment: Fasting or morning glucose range: 125-130  Highest glucose 2 hours after any meal:  Not checked. Hypoglycemia : no .                                                                                                                      Exercise : water aerobics 2X / week & machines 5 X/ week . Nutrition/diet:  No plan. Medication compliance : yes.  Eye exam : 01/12, appt 10/13. Foot care : last week.  A1c/ urine microalbumin monitor: 6.7% in  5/13       Review of Systems Excess thirst :no;  Excess hunger:  no ;  Excess urination:  no.                                  Lightheadedness with standing:  no. Chest pain:  no ; Palpitations :no ;  Pain in  calves with walking:  no .                                                                                                                              Non healing skin  ulcers or sores,especially over the feet:  no. Numbness or tingling or burning in feet : no .                                                                                                                                             Significant change in  Weight : down slightly. Vision changes : no  .        Objective:   Physical Exam Gen.:  well-nourished in appearance. Alert, appropriate and cooperative throughout exam. Eyes: No corneal or conjunctival inflammation noted.  Mouth: Oral mucosa and oropharynx reveal no lesions or exudates. Upper plate ; lower teeth in good repair. Neck: No deformities, masses, or tenderness noted.  Thyroid  normal. Lungs: Normal respiratory effort; chest expands symmetrically. Lungs are clear to auscultation without rales, wheezes, or increased work of breathing. Heart: Normal rate and rhythm. Normal S1 and S2. No gallop, click, or rub. Grade 1.5 - 2/6 systolic murmur with carotid radiation Abdomen: Bowel sounds  normal; abdomen soft and nontender. No masses, organomegaly or hernias noted.  Musculoskeletal/extremities: No clubbing, cyanosis, edema, or deformity noted. Nail health  good. Vascular: Carotid, radial artery  pulses are full and equal. Dorsalis pedis and  posterior tibial pulses decreased Neurologic: Alert and oriented x3.  Light touch normal over feet.  Skin: Intact without suspicious lesions or rashes. Psych: Mood and affect are normal. Normally interactive                                                                                         Assessment & Plan:

## 2012-07-30 NOTE — Patient Instructions (Addendum)
Please  schedule fasting Labs in 3-4 mos : BMET,Lipids, hepatic panel, A1c, TSH.PLEASE BRING THESE INSTRUCTIONS TO FOLLOW UP  LAB APPOINTMENT.This will guarantee correct labs are drawn, eliminating need for repeat blood sampling ( needle sticks ! ). Diagnoses /Codes: 250.00, 272.4,995.20

## 2012-07-30 NOTE — Assessment & Plan Note (Signed)
A1c and urine microalbumin will be checked to assess present medical regimen

## 2012-08-13 ENCOUNTER — Other Ambulatory Visit: Payer: Self-pay | Admitting: Obstetrics and Gynecology

## 2012-08-13 DIAGNOSIS — Z9889 Other specified postprocedural states: Secondary | ICD-10-CM

## 2012-08-13 DIAGNOSIS — Z853 Personal history of malignant neoplasm of breast: Secondary | ICD-10-CM

## 2012-08-28 ENCOUNTER — Ambulatory Visit
Admission: RE | Admit: 2012-08-28 | Discharge: 2012-08-28 | Disposition: A | Discharge status: Home / Self Care | Payer: Medicare Other | Source: Ambulatory Visit | Attending: Obstetrics and Gynecology | Admitting: Obstetrics and Gynecology

## 2012-08-28 DIAGNOSIS — Z853 Personal history of malignant neoplasm of breast: Secondary | ICD-10-CM

## 2012-08-28 DIAGNOSIS — Z9889 Other specified postprocedural states: Secondary | ICD-10-CM

## 2012-09-01 ENCOUNTER — Telehealth: Payer: Self-pay

## 2012-09-01 NOTE — Telephone Encounter (Signed)
Pt requesting sample for Crest 20 mg, Janumet 50-1,000 mg. Spoke to Chrae she advised me okay since on pt med list. I advised pt samples upfront.     MW

## 2012-09-09 ENCOUNTER — Other Ambulatory Visit: Payer: Self-pay | Admitting: Internal Medicine

## 2012-09-09 NOTE — Telephone Encounter (Signed)
Rx sent.    MW 

## 2012-09-10 ENCOUNTER — Other Ambulatory Visit: Payer: Self-pay | Admitting: Internal Medicine

## 2012-09-10 NOTE — Telephone Encounter (Signed)
Rx faxed.     MW  

## 2012-09-11 ENCOUNTER — Telehealth: Payer: Self-pay

## 2012-09-11 NOTE — Telephone Encounter (Signed)
Pt called left a message on triage line stating wanted [facility] nurse Chrae to call her. Pt didn't state the reason.            MW

## 2012-09-11 NOTE — Telephone Encounter (Signed)
Please F/U ; thanks

## 2012-09-11 NOTE — Telephone Encounter (Signed)
Spoke with patient, patient is currently in the doughnut hole and was requesting samples. Per Dr.Hopper ok to given samples

## 2012-10-13 ENCOUNTER — Telehealth: Payer: Self-pay | Admitting: Cardiovascular Disease

## 2012-10-13 NOTE — Telephone Encounter (Signed)
plz return call to pt [phone] regarding medication samples

## 2012-10-13 NOTE — Telephone Encounter (Signed)
PT AWARE  WOULD ONLY HAVE CRESTOR 20 MG SAMPLES  CHECKED   IN SAMPLE CLOSET NONE AVAILABLE INFORMED PT TO  CALL IN COUPLES WEEKS  TO CHECK IF RECEIVED ANY SAMPLES  .[name]

## 2012-11-13 ENCOUNTER — Telehealth: Payer: Self-pay | Admitting: *Deleted

## 2012-11-13 NOTE — Telephone Encounter (Signed)
Discuss with patient, samples placed up front. 

## 2012-11-13 NOTE — Telephone Encounter (Signed)
Samples for 2-4 weeks if available.Cheaper therapeutically equivalent prescription medication options may be available from  [facility] [phone] (toll-free).

## 2012-12-24 ENCOUNTER — Other Ambulatory Visit: Payer: Self-pay | Admitting: Internal Medicine

## 2013-01-16 ENCOUNTER — Other Ambulatory Visit: Payer: Self-pay

## 2013-01-16 MED ORDER — GLUCOSE BLOOD VI STRP: ORAL_STRIP | Status: DC

## 2013-01-16 MED ORDER — ONETOUCH ULTRASOFT LANCETS MISC: Status: DC

## 2013-01-19 ENCOUNTER — Other Ambulatory Visit: Payer: Self-pay | Admitting: *Deleted

## 2013-01-19 MED ORDER — FUROSEMIDE 40 MG PO TABS: 40.0000 mg | ORAL_TABLET | Freq: Two times a day (BID) | ORAL | Status: DC

## 2013-01-22 ENCOUNTER — Other Ambulatory Visit: Payer: Self-pay | Admitting: *Deleted

## 2013-01-22 MED ORDER — POTASSIUM CHLORIDE CRYS ER 20 MEQ PO TBCR: 20.0000 meq | EXTENDED_RELEASE_TABLET | Freq: Two times a day (BID) | ORAL | Status: AC

## 2013-01-22 NOTE — Telephone Encounter (Signed)
Fax Received. Refill Completed. Jacoba Cherney Chowoe (R.M.A)   

## 2013-04-01 ENCOUNTER — Other Ambulatory Visit: Payer: Self-pay | Admitting: Internal Medicine

## 2013-04-01 NOTE — Telephone Encounter (Signed)
A1c 250.00, Lipid/Hep 272.4/995.20

## 2013-04-28 ENCOUNTER — Other Ambulatory Visit: Payer: Self-pay | Admitting: Internal Medicine

## 2013-04-29 NOTE — Telephone Encounter (Signed)
Left msg making pt aware due for lab results.  Refill done.

## 2013-05-22 ENCOUNTER — Telehealth: Payer: Self-pay | Admitting: *Deleted

## 2013-05-22 NOTE — Telephone Encounter (Signed)
Called pt after she left a message on the triage requesting samples of Crestor 20mg  and Janumet 50/1000. Patient advised that she was overdue for an OV, appt made for Tues 7/22 at 10:00am. Samples given for 1 week supply Crestor (Lot # [id number] with Exp Date 05/2015); Janumet (Lot # [id number], Exp date 12/2013)

## 2013-05-26 ENCOUNTER — Ambulatory Visit: Payer: Medicare Other | Admitting: Internal Medicine

## 2013-05-27 ENCOUNTER — Ambulatory Visit: Payer: Medicare Other | Admitting: Internal Medicine

## 2013-05-28 ENCOUNTER — Ambulatory Visit: Payer: Medicare Other | Admitting: Internal Medicine

## 2013-06-02 ENCOUNTER — Encounter: Payer: Self-pay | Admitting: Internal Medicine

## 2013-06-02 ENCOUNTER — Ambulatory Visit (INDEPENDENT_AMBULATORY_CARE_PROVIDER_SITE_OTHER): Payer: Medicare Other | Admitting: Internal Medicine

## 2013-06-02 ENCOUNTER — Other Ambulatory Visit: Payer: Self-pay | Admitting: Internal Medicine

## 2013-06-02 VITALS — BP 130/78 | HR 63 | Wt 262.0 lb

## 2013-06-02 DIAGNOSIS — Z954 Presence of other heart-valve replacement: Secondary | ICD-10-CM

## 2013-06-02 DIAGNOSIS — E785 Hyperlipidemia, unspecified: Secondary | ICD-10-CM

## 2013-06-02 DIAGNOSIS — E119 Type 2 diabetes mellitus without complications: Secondary | ICD-10-CM

## 2013-06-02 DIAGNOSIS — I10 Essential (primary) hypertension: Secondary | ICD-10-CM

## 2013-06-02 DIAGNOSIS — Z860100 Personal history of colon polyps, unspecified: Secondary | ICD-10-CM

## 2013-06-02 LAB — BASIC METABOLIC PANEL
Calcium: 9.3 mg/dL (ref 8.4–10.5)
GFR: 75.24 mL/min (ref >60.00)
Sodium: 136 mEq/L (ref 135–145)

## 2013-06-02 LAB — HEPATIC FUNCTION PANEL
AST: 19 U/L (ref 0–37)
Albumin: 4 g/dL (ref 3.5–5.2)
Alkaline Phosphatase: 77 U/L (ref 39–117)
Bilirubin, Direct: 0 mg/dL (ref 0.0–0.3)

## 2013-06-02 LAB — LIPID PANEL
HDL: 46.9 mg/dL (ref >39.00)
Total CHOL/HDL Ratio: 4
VLDL: 21.8 mg/dL (ref 0.0–40.0)

## 2013-06-02 LAB — CBC WITH DIFFERENTIAL/PLATELET
Basophils Absolute: 0 10*3/uL (ref 0.0–0.1)
Hemoglobin: 11.3 g/dL — ABNORMAL LOW (ref 12.0–15.0)
Lymphocytes Relative: 23.9 % (ref 12.0–46.0)
Monocytes Relative: 7.1 % (ref 3.0–12.0)
Neutro Abs: 5.5 10*3/uL (ref 1.4–7.7)
RBC: 3.98 Mil/uL (ref 3.87–5.11)
RDW: 15.1 % — ABNORMAL HIGH (ref 11.5–14.6)

## 2013-06-02 LAB — LDL CHOLESTEROL, DIRECT: Direct LDL: 133.6 mg/dL

## 2013-06-02 LAB — MICROALBUMIN / CREATININE URINE RATIO
Creatinine,U: 30.9 mg/dL
Microalb, Ur: 0.3 mg/dL (ref 0.0–1.9)

## 2013-06-02 NOTE — Assessment & Plan Note (Signed)
Hepatic panel & lipids

## 2013-06-02 NOTE — Patient Instructions (Addendum)
Share results with all non [facility] medical staff seen  

## 2013-06-02 NOTE — Assessment & Plan Note (Signed)
See labs 

## 2013-06-02 NOTE — Progress Notes (Signed)
Subjective:    Patient ID: [name], female    DOB: 03/18/1941, 72 y.o.   MRN: [id number]  HPI The patient is here for followup of diabetes, hyperlipidemia ,and hypertension.  The most recent A1c 9/13 was  6.7%  , which correlates to an average sugar of 143 , and long-term risk of 32 % . Fasting blood sugar  Average<130 .  Two-hour postprandial glucose is   not monitored . No hypoglycemia reported Last ophthalmologic examination  2013 revealed no retinopathy. Active podiatry assessment on record. Diet is non specific. Exercise  5X/ week as water aerobics or elliptical  for40-60 minutes.  No  lipids on record . There is medical compliance with the statin.  Blood pressure not monitored  . There is medical  compliance with antihypertensive medications  No  medication adverse effects suggested of        Review of Systems Constitutional: 4-6 # loss in weight; no  excess fatigue Eyes: No  blurred vision;  double vision ; loss of vision Cardiovascular: no chest pain ;palpitations; racing; irregular rhythm ;syncope; claudication ; edema  Respiratory: No exertional dyspnea;  paroxysmal nocturnal dyspnea Genitourinary: Urinary  Frequency ? From Lasix;  No incontinence Musculoskeletal: No myalgias or muscle cramping  Dermatologic: No non healing  Lesions; change in color or temperature of skin. Rash RLE X 1 week , resolving w/o Rx Neurologic: No  limb weakness; morning RUE numbness ;no  Burning. She has intermittent pain in L 5 distribution from hip to foot. Cane of no benefit; Tylenol & Aleve help Endocrine: No change in hair, skin, nails. No excessive thirst; excessive hunger;  excessive urination except with Lasix       Objective:   Physical Exam Gen.:  well-nourished in appearance but morbidly obese. Alert, appropriate and cooperative throughout exam.  Head: Normocephalic without obvious abnormalities  Eyes: No corneal or conjunctival inflammation noted. Nose: External  nasal exam reveals no deformity or inflammation. Nasal mucosa are pink and moist. No lesions or exudates noted.  Mouth: Oral mucosa and oropharynx reveal no lesions or exudates. Upper denture. Neck: No deformities, masses, or tenderness noted.  Thyroid normal. Lungs: Normal respiratory effort; chest expands symmetrically. Lungs are clear to auscultation without rales, wheezes, or increased work of breathing. Heart: Normal rate and rhythm. Normal S1 and S2. No gallop, click, or rub. Grade 2.5 -3/6 systolic murmur with neck radiation Abdomen: Bowel sounds normal; abdomen soft and nontender. No masses, organomegaly or hernias noted. Protuberant                               Musculoskeletal/extremities: No deformity or scoliosis noted of  the thoracic or lumbar spine.   No clubbing, cyanosis, edema, or significant extremity  deformity noted. Neg SLR bilaterally.Tone & strength  Normal. Fusiform knees. Fingernail health good. Unable to lie down & sit up w/o help. DOE simply sitting up from table Vascular: Carotid & radial artery  pulses are full and equal.Decreased dorsalis pedis and  posterior tibial pulses Neurologic: Alert and oriented x3. Deep tendon reflexes symmetrical and 0+  Using cane        Skin:Bland 12X22 & 22 X 30 mm faint  hyperpigmentation RLE anteriorly. Lymph: No cervical, axillary lymphadenopathy present. Psych: Mood and affect are normal. Normally interactive History in reference to exercise questionable @ best  Assessment & Plan:  See Current Assessment & Plan in Problem List under specific Diagnosis

## 2013-06-02 NOTE — Assessment & Plan Note (Signed)
BP goals discussed 

## 2013-06-02 NOTE — Assessment & Plan Note (Signed)
Valvular status assessment overdue

## 2013-06-10 ENCOUNTER — Other Ambulatory Visit: Payer: Self-pay

## 2013-06-19 ENCOUNTER — Telehealth: Payer: Self-pay | Admitting: Internal Medicine

## 2013-06-19 MED ORDER — TRAMADOL HCL 50 MG PO TABS: 50.0000 mg | ORAL_TABLET | Freq: Four times a day (QID) | ORAL | Status: DC | PRN

## 2013-06-19 NOTE — Telephone Encounter (Signed)
OV was 06/02/2013 Please advise.

## 2013-06-19 NOTE — Telephone Encounter (Signed)
Per Dr.Hopper: Tramadol 50 mg 1 by mouth every 6 hours as needed #30/0 refills, if symptoms continue patient will be referred to specialist   Rx called in, patient informed and verbalized understanding of Dr.Hopper's response

## 2013-06-19 NOTE — Telephone Encounter (Signed)
Patient called stating she mentioned to Dr. Alwyn Ren when she was here 7/29 that she had sciatica. She states she still has the pain and she would like a prescription. She uses [facility]

## 2013-06-24 ENCOUNTER — Telehealth: Payer: Self-pay | Admitting: Internal Medicine

## 2013-06-24 NOTE — Telephone Encounter (Signed)
Spoke with patient who states that she has seen Dr. Charlann Boxer in the past. Called ortho who states that Dr. Charlann Boxer does not treat sciatic pain. There are others in the practice who do and patient was fine with seeing one of them. Patient is scheduled to see Dr. Shelle Iron on 06/30/13 at 2:30. Patient has been notified.

## 2013-06-24 NOTE — Telephone Encounter (Signed)
Patient states she is still in severe pain and would like to proceed with referral to specialist. She is requesting that this be entered as urgent.

## 2013-06-24 NOTE — Telephone Encounter (Signed)
Please verify which orthopedic back specialist she has seen in the past or if she has a preference. I believe she was seeing Dr. Leslee Home in the past but he has retired or ? no longer on her  plan.

## 2013-06-24 NOTE — Telephone Encounter (Signed)
Where would you like to refer patient to for sciatic nerve pain?

## 2013-06-29 ENCOUNTER — Other Ambulatory Visit: Payer: Self-pay | Admitting: Internal Medicine

## 2013-07-19 ENCOUNTER — Other Ambulatory Visit: Payer: Self-pay | Admitting: Internal Medicine

## 2013-07-20 NOTE — Telephone Encounter (Signed)
Med filled.  

## 2013-08-03 ENCOUNTER — Ambulatory Visit (INDEPENDENT_AMBULATORY_CARE_PROVIDER_SITE_OTHER): Payer: Medicare Other | Admitting: Cardiovascular Disease

## 2013-08-03 VITALS — BP 132/80 | HR 58 | Ht 64.0 in | Wt 254.8 lb

## 2013-08-03 DIAGNOSIS — M545 Low back pain, unspecified: Secondary | ICD-10-CM

## 2013-08-03 DIAGNOSIS — E785 Hyperlipidemia, unspecified: Secondary | ICD-10-CM

## 2013-08-03 DIAGNOSIS — E119 Type 2 diabetes mellitus without complications: Secondary | ICD-10-CM

## 2013-08-03 DIAGNOSIS — I359 Nonrheumatic aortic valve disorder, unspecified: Secondary | ICD-10-CM

## 2013-08-03 DIAGNOSIS — I10 Essential (primary) hypertension: Secondary | ICD-10-CM

## 2013-08-03 NOTE — Assessment & Plan Note (Signed)
Discussed low carb diet.  Target hemoglobin A1c is 6.5 or less.  Continue current medications.  

## 2013-08-03 NOTE — Progress Notes (Signed)
Patient ID: [name], female   DOB: 1941-06-15, 72 y.o.   MRN: [id number] Delightful 72 yo [place] female post tissue AVR. Echo 2011 normal EF with some annular valve mismatch mean gradient no AR. Always been heavy but goes to the gym regularly and tries to exercise. Seen in ER 1/4 with CHF improved with Lasix. Continues to have exertional dsypnea with mild chronic LE edema. 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 Compliant with meds. Weight stable around 269. Some salt indiscretion with middle [place] diet. No pal;pitations, or syncope   Has had elevated gradient across valve   Aortic valve: The prosthetic aortic valve seems to open normally. The measured gradient would suggest severe aortic stenosis but I suspect it is due to a small valve with a hypertrophied LV. Doppler: VTI ratio of LVOT to aortic valve: 0.23. Peak velocity ratio of LVOT to aortic valve: 0.22. Mean gradient: 65mm Hg (S). Peak gradient: 94mm Hg (S).  Immediately post op 12/09/09 gradients were mean 35 mmHg and peak of 62 mmHg  Severe back pain and Sciatica on RLE  Has bilateral knee replacements but MRI should be ok    ROS: Denies fever, malais, weight loss, blurry vision, decreased visual acuity, cough, sputum, SOB, hemoptysis, pleuritic pain, palpitaitons, heartburn, abdominal pain, melena, lower extremity edema, claudication, or rash.  All other systems reviewed and negative  General: Affect appropriate Obese female  HEENT: normal Neck supple with no adenopathy JVP normal no bruits no thyromegaly Lungs clear with no wheezing and good diaphragmatic motion Heart:  S1/S2 systolic  murmur, no rub, gallop or click PMI normal Abdomen: benighn, BS positve, no tenderness, no AAA no bruit.  No HSM or HJR Distal pulses intact with no bruits No edema Neuro non-focal Skin warm and dry No muscular weakness     Allergies  Septra  Electrocardiogram:  1/13  SR rate 58 normal    Today SR rate  58  LVH  Otherwise normal   Assessment and Plan

## 2013-08-03 NOTE — Assessment & Plan Note (Signed)
S/P tissue AVR  Replaced with undersized valve  High gradients since immediately post op.  No change in murmur  Echo in a couple months when back issues resolved  SBE prophylaxis

## 2013-08-03 NOTE — Patient Instructions (Signed)
Your physician wants you to follow-up in:  6 MONTHS WITH DR NISHAN  You will receive a reminder letter in the mail two months in advance. If you don't receive a letter, please call our office to schedule the follow-up appointment. Your physician recommends that you continue on your current medications as directed. Please refer to the Current Medication list given to you today. 

## 2013-08-03 NOTE — Assessment & Plan Note (Signed)
Well controlled.  Continue current medications and low sodium Dash type diet.    

## 2013-08-03 NOTE — Assessment & Plan Note (Signed)
Cholesterol is at goal.  Continue current dose of statin and diet Rx.  No myalgias or side effects.  F/U  LFT's in 6 months. Lab Results  Component Value Date   LDLCALC 101* 09/20/2011

## 2013-08-03 NOTE — Assessment & Plan Note (Signed)
Gave her name of chiropractor Zachery Conch  Encouraged her to f/u with MRI and further Rx

## 2013-08-06 ENCOUNTER — Encounter: Payer: Self-pay | Admitting: Internal Medicine

## 2013-08-06 ENCOUNTER — Ambulatory Visit (INDEPENDENT_AMBULATORY_CARE_PROVIDER_SITE_OTHER): Payer: Medicare Other | Admitting: Internal Medicine

## 2013-08-06 VITALS — BP 127/77 | HR 74 | Temp 97.7°F | Wt 254.0 lb

## 2013-08-06 DIAGNOSIS — E119 Type 2 diabetes mellitus without complications: Secondary | ICD-10-CM

## 2013-08-06 DIAGNOSIS — Z954 Presence of other heart-valve replacement: Secondary | ICD-10-CM

## 2013-08-06 DIAGNOSIS — M545 Low back pain: Secondary | ICD-10-CM

## 2013-08-06 DIAGNOSIS — J45901 Unspecified asthma with (acute) exacerbation: Secondary | ICD-10-CM

## 2013-08-06 MED ORDER — DOXYCYCLINE HYCLATE 100 MG PO TABS: 100.0000 mg | ORAL_TABLET | Freq: Two times a day (BID) | ORAL | Status: DC

## 2013-08-06 MED ORDER — MONTELUKAST SODIUM 10 MG PO TABS: 10.0000 mg | ORAL_TABLET | Freq: Every day | ORAL | Status: AC

## 2013-08-06 NOTE — Patient Instructions (Addendum)
Dulera  two inhalations every 12 hours; gargle and spit after use. Monitor the glucose before eating  (fasting blood sugar) Monday, Wednesday, Friday, and Sunday. This should range from 100-150. Check glucose 2 hours after  breakfast on Tuesday; 2 hours after lunch on Thursday; and 2 hours after the meal on Saturday. This value should average  less than 180, ideally less than 160.

## 2013-08-06 NOTE — Progress Notes (Signed)
  Subjective:    Patient ID: [name], female    DOB: [birth year], 72 y.o.   MRN: [id number]  HPI  Symptoms began 2 days ago his . She's had a cough with scant white sputum. She's been using a spacer chamber with Dulera 2 puffs at night with some benefit.  The cough has aggravated her low back syndrome for which she's been seeing Dr Shelle Iron who prescribed oral steroids. He recommended an MRI. She declined to complete this as a friend  reminded her she had knee implants. Apparently these are titanium which would not preclude MRI. Despite the oral steroids she states her fasting glucoses have not been above 130.  She questions whether she may have had a history of asthma. She smoked minimally for less than a decade. She has had a aortic valvular heart transplant.     Review of Systems  She denies extrinsic symptoms of itchy, watery eyes, sneezing.  She has no associated fever, chills, or sweats.  She is not having associated shortness of breath or paroxysmal nocturnal dyspnea .  She also denies having edema  There's been no rhinosinusitis symptoms of frontal headache, facial pain, nasal purulence, dental pain, sore throat, otic pain,or otic discharge.     Objective:   Physical Exam  General appearance:morbid obesity; no acute distress or increased work of breathing is present.  No  lymphadenopathy about the head, neck, or axilla noted.   Eyes: No conjunctival inflammation or lid edema is present. There is no scleral icterus.  Ears:  External ear exam shows no significant lesions or deformities.  Otoscopic examination reveals clear canals, tympanic membranes are intact bilaterally without bulging, retraction, inflammation or discharge.  Nose:  External nasal examination shows no deformity or inflammation. Nasal mucosa are pink and moist without lesions or exudates. No septal dislocation or deviation.No obstruction to airflow.   Oral exam: Upper denture; lips and gums are healthy  appearing.There is no oropharyngeal erythema or exudate noted.   Neck:  No deformities,  masses, or tenderness noted.     Heart:  Normal rate and regular rhythm. S1 and S2 normal without gallop,  click, rub or other extra sounds.  Mechanical valve grade 1.5 systolic murmur at the base  Lungs: Diffuse low-grade musical rhonchi and wheezes symmetrically.No increased work of breathing.    Extremities:  No cyanosis, edema, or clubbing  noted    Skin: Warm & dry w/o jaundice or tenting.  Pedal pulses are intact.         Assessment & Plan:  #1 asthma exacerbation  #2 sciatica aggravated by coughing with her asthma  #3 diabetes without apparent exacerbation despite oral steroids for #2

## 2013-08-06 NOTE — Assessment & Plan Note (Signed)
Monitor the glucose before eating  (fasting blood sugar) Monday, Wednesday, Friday, and Sunday. This should range from 100-150. Check glucose 2 hours after  breakfast on Tuesday; 2 hours after lunch on Thursday; and 2 hours after the meal on Saturday. This value should average  less than 180, ideally less than 160. 

## 2013-08-06 NOTE — Assessment & Plan Note (Signed)
Clinically no cardiac decompensation 

## 2013-08-11 ENCOUNTER — Other Ambulatory Visit: Payer: Self-pay | Admitting: Obstetrics and Gynecology

## 2013-09-10 ENCOUNTER — Other Ambulatory Visit: Payer: Self-pay

## 2013-09-22 ENCOUNTER — Other Ambulatory Visit: Payer: Self-pay | Admitting: Obstetrics and Gynecology

## 2013-09-22 DIAGNOSIS — Z9889 Other specified postprocedural states: Secondary | ICD-10-CM

## 2013-09-22 DIAGNOSIS — Z853 Personal history of malignant neoplasm of breast: Secondary | ICD-10-CM

## 2013-09-29 ENCOUNTER — Other Ambulatory Visit: Payer: Self-pay | Admitting: Internal Medicine

## 2013-09-30 NOTE — Telephone Encounter (Signed)
Crestor refilled per protocol.

## 2013-10-08 ENCOUNTER — Ambulatory Visit (INDEPENDENT_AMBULATORY_CARE_PROVIDER_SITE_OTHER): Payer: Medicare Other | Admitting: Internal Medicine

## 2013-10-08 ENCOUNTER — Encounter: Payer: Self-pay | Admitting: Internal Medicine

## 2013-10-08 VITALS — BP 94/63 | HR 69 | Temp 97.6°F | Wt 259.0 lb

## 2013-10-08 DIAGNOSIS — Z23 Encounter for immunization: Secondary | ICD-10-CM

## 2013-10-08 DIAGNOSIS — I959 Hypotension, unspecified: Secondary | ICD-10-CM

## 2013-10-08 DIAGNOSIS — B349 Viral infection, unspecified: Secondary | ICD-10-CM

## 2013-10-08 DIAGNOSIS — R5381 Other malaise: Secondary | ICD-10-CM

## 2013-10-08 DIAGNOSIS — R112 Nausea with vomiting, unspecified: Secondary | ICD-10-CM

## 2013-10-08 DIAGNOSIS — R531 Weakness: Secondary | ICD-10-CM

## 2013-10-08 DIAGNOSIS — S299XXA Unspecified injury of thorax, initial encounter: Secondary | ICD-10-CM

## 2013-10-08 DIAGNOSIS — B9789 Other viral agents as the cause of diseases classified elsewhere: Secondary | ICD-10-CM

## 2013-10-08 DIAGNOSIS — S298XXA Other specified injuries of thorax, initial encounter: Secondary | ICD-10-CM

## 2013-10-08 LAB — POCT INFLUENZA A/B
Influenza A, POC: NEGATIVE
Influenza B, POC: NEGATIVE

## 2013-10-08 MED ORDER — LOSARTAN POTASSIUM 100 MG PO TABS: ORAL_TABLET | ORAL | Status: DC

## 2013-10-08 NOTE — Progress Notes (Signed)
   Subjective:    Patient ID: [name], female    DOB: [birth year], 72 y.o.   MRN: [id number]  HPI   Symptoms began 10/05/13 as fever associated with weakness. She says she was so weak she had difficulty getting up. In fact 12/2 she fell striking her right inferior thoracic area on a stool. Since that time she's had some intermittent pain with deep inspiration. She was evaluated by EMS; she declined being transported to the hospital.  She had vomiting on one occasion and loose stool x1  Her daughter and grandchildren have been ill with a virus  She has not taken the flu shot this year.    Review of Systems  She denies frontal headache, facial pain, sore throat, dental pain, otic pain, or otic discharge.  She is not had a cough, shortness of breath,  or sputum production. She had some wheezing.     Objective:   Physical Exam General appearance is one of good health and nourishment w/o distress.  Eyes: No conjunctival inflammation or scleral icterus is present.  Oral exam: Upper plate; lips and gums are healthy appearing.There is no oropharyngeal erythema or exudate noted.   Heart:  Normal rate and regular rhythm. S1 and S2 normal without gallop, click, rub or other extra sounds . Grade 2.5/6 systolic murmur   Lungs:Chest clear to auscultation; no wheezes, rhonchi,rales ,or rubs present.No increased work of breathing.  Tender to palpitation R inferior/ anterior thorax  Abdomen: bowel sounds normal, soft and non-tender without masses, organomegaly or hernias noted.  No guarding or rebound . No tenderness over the flanks to percussion   Skin:Warm & dry.  Intact without suspicious lesions or rashes ; no jaundice or tenting  Lymphatic: No lymphadenopathy is noted about the head, neck, axilla                Assessment & Plan:  #1 viral syndrome with weakness & vomiting   #2 chest wall injury from fall;  fracture or pneumothorax not clinically suggested  #3  hypotension  Plan: Nasal swab for influenza. See labs  Decrease losartan to one half pill daily and monitor blood pressure.

## 2013-10-08 NOTE — Patient Instructions (Addendum)
Your next office appointment will be determined based upon review of your pending labs . Those instructions will be transmitted to you through My Chart . Please report any significant change in your symptoms. Repeat the isometric exercises discussed 4- 5 times prior to standing if you've been seated for a period of time. Decrease losartan 100 mg one half pill daily and monitor blood pressure. Minimal Blood Pressure Goal= AVERAGE < 140/90;  Ideal is an AVERAGE < 135/85. This AVERAGE should be calculated from @ least 5-7 BP readings taken @ different times of day on different days of week. You should not respond to isolated BP readings , but rather the AVERAGE for that week .Please bring your  blood pressure cuff to office visits to verify that it is reliable.It  can also be checked against the blood pressure device at the pharmacy. Finger or wrist cuffs are not dependable; an arm cuff is.

## 2013-10-08 NOTE — Progress Notes (Signed)
Pre visit review using our clinic review tool, if applicable. No additional management support is needed unless otherwise documented below in the visit note. 

## 2013-10-09 LAB — BASIC METABOLIC PANEL
Calcium: 9.2 mg/dL (ref 8.4–10.5)
Chloride: 99 mEq/L (ref 96–112)
Creatinine, Ser: 1.1 mg/dL (ref 0.4–1.2)
GFR: 54.32 mL/min — ABNORMAL LOW (ref >60.00)

## 2013-10-09 LAB — CBC WITH DIFFERENTIAL/PLATELET
Basophils Relative: 0.2 % (ref 0.0–3.0)
Eosinophils Relative: 0.9 % (ref 0.0–5.0)
Hemoglobin: 10.9 g/dL — ABNORMAL LOW (ref 12.0–15.0)
Lymphocytes Relative: 13.8 % (ref 12.0–46.0)
MCV: 85.7 fl (ref 78.0–100.0)
Monocytes Relative: 10 % (ref 3.0–12.0)
Neutrophils Relative %: 75.1 % (ref 43.0–77.0)
Platelets: 169 10*3/uL (ref 150.0–400.0)
RBC: 3.83 Mil/uL — ABNORMAL LOW (ref 3.87–5.11)
WBC: 5.9 10*3/uL (ref 4.5–10.5)

## 2013-10-10 ENCOUNTER — Other Ambulatory Visit: Payer: Self-pay | Admitting: Internal Medicine

## 2013-10-10 DIAGNOSIS — E1129 Type 2 diabetes mellitus with other diabetic kidney complication: Secondary | ICD-10-CM

## 2013-10-10 DIAGNOSIS — D649 Anemia, unspecified: Secondary | ICD-10-CM

## 2013-10-13 ENCOUNTER — Telehealth: Payer: Self-pay | Admitting: *Deleted

## 2013-10-13 NOTE — Telephone Encounter (Signed)
Patient called and stated that since getting the flu shot on 10/08/13, her right leg seems "locked up" at times. She states she now requires help to get out of her chair at home. She would like to know if this is related to the shot. Please advise.

## 2013-10-13 NOTE — Telephone Encounter (Signed)
The flu shot would not affect the leg; it might be associated with local reaction in the arm. She needs to be seen for any fever, rash, numbness or tingling or weakness in her extremities.

## 2013-10-30 ENCOUNTER — Telehealth: Payer: Self-pay | Admitting: *Deleted

## 2013-10-30 ENCOUNTER — Encounter: Payer: Self-pay | Admitting: *Deleted

## 2013-10-30 NOTE — Telephone Encounter (Signed)
Patient called and stated that she needed to be seen for urinary infection. I informed patient that we did not have anything and offered her urgent care information. Patient then demanded that I make her an appointment to see dr [name]. Please advise.

## 2013-10-30 NOTE — Telephone Encounter (Signed)
Please advise 

## 2013-10-30 NOTE — Telephone Encounter (Signed)
CCU & OV @ 11:45  today

## 2013-10-30 NOTE — Telephone Encounter (Signed)
Called patient, not able to leave voicemail. JG//CMA

## 2013-11-10 ENCOUNTER — Other Ambulatory Visit (HOSPITAL_COMMUNITY): Payer: Self-pay | Admitting: Orthopedic Surgery

## 2013-11-10 DIAGNOSIS — M25562 Pain in left knee: Secondary | ICD-10-CM

## 2013-11-13 ENCOUNTER — Encounter: Payer: Self-pay | Admitting: Lab

## 2013-11-14 ENCOUNTER — Other Ambulatory Visit: Payer: Self-pay | Admitting: Internal Medicine

## 2013-11-16 ENCOUNTER — Ambulatory Visit (INDEPENDENT_AMBULATORY_CARE_PROVIDER_SITE_OTHER): Payer: Medicare Other | Admitting: Internal Medicine

## 2013-11-16 ENCOUNTER — Emergency Department (HOSPITAL_COMMUNITY): Payer: Medicare Other

## 2013-11-16 ENCOUNTER — Inpatient Hospital Stay (HOSPITAL_COMMUNITY)
Admission: EM | Admit: 2013-11-16 | Discharge: 2013-11-24 | DRG: 280 | Disposition: A | Discharge status: Skilled Nursing Facility | Payer: Medicare Other | Attending: Cardiovascular Disease | Admitting: Cardiovascular Disease

## 2013-11-16 ENCOUNTER — Encounter: Payer: Self-pay | Admitting: Internal Medicine

## 2013-11-16 ENCOUNTER — Encounter (HOSPITAL_COMMUNITY): Payer: Self-pay | Admitting: Emergency Medicine

## 2013-11-16 VITALS — BP 94/63 | HR 138 | Temp 97.7°F | Wt 241.0 lb

## 2013-11-16 DIAGNOSIS — E1129 Type 2 diabetes mellitus with other diabetic kidney complication: Secondary | ICD-10-CM

## 2013-11-16 DIAGNOSIS — R000 Tachycardia, unspecified: Secondary | ICD-10-CM

## 2013-11-16 DIAGNOSIS — K219 Gastro-esophageal reflux disease without esophagitis: Secondary | ICD-10-CM | POA: Diagnosis present

## 2013-11-16 DIAGNOSIS — M1712 Unilateral primary osteoarthritis, left knee: Secondary | ICD-10-CM

## 2013-11-16 DIAGNOSIS — I214 Non-ST elevation (NSTEMI) myocardial infarction: Secondary | ICD-10-CM | POA: Diagnosis present

## 2013-11-16 DIAGNOSIS — IMO0002 Reserved for concepts with insufficient information to code with codable children: Secondary | ICD-10-CM

## 2013-11-16 DIAGNOSIS — M1710 Unilateral primary osteoarthritis, unspecified knee: Secondary | ICD-10-CM

## 2013-11-16 DIAGNOSIS — I509 Heart failure, unspecified: Secondary | ICD-10-CM | POA: Diagnosis present

## 2013-11-16 DIAGNOSIS — I5031 Acute diastolic (congestive) heart failure: Secondary | ICD-10-CM

## 2013-11-16 DIAGNOSIS — Z7982 Long term (current) use of aspirin: Secondary | ICD-10-CM

## 2013-11-16 DIAGNOSIS — Z7901 Long term (current) use of anticoagulants: Secondary | ICD-10-CM

## 2013-11-16 DIAGNOSIS — I428 Other cardiomyopathies: Secondary | ICD-10-CM | POA: Diagnosis present

## 2013-11-16 DIAGNOSIS — I359 Nonrheumatic aortic valve disorder, unspecified: Secondary | ICD-10-CM | POA: Diagnosis present

## 2013-11-16 DIAGNOSIS — Z87891 Personal history of nicotine dependence: Secondary | ICD-10-CM

## 2013-11-16 DIAGNOSIS — I5041 Acute combined systolic (congestive) and diastolic (congestive) heart failure: Secondary | ICD-10-CM | POA: Diagnosis present

## 2013-11-16 DIAGNOSIS — I959 Hypotension, unspecified: Secondary | ICD-10-CM

## 2013-11-16 DIAGNOSIS — E785 Hyperlipidemia, unspecified: Secondary | ICD-10-CM | POA: Diagnosis present

## 2013-11-16 DIAGNOSIS — I10 Essential (primary) hypertension: Secondary | ICD-10-CM | POA: Diagnosis present

## 2013-11-16 DIAGNOSIS — E119 Type 2 diabetes mellitus without complications: Secondary | ICD-10-CM | POA: Diagnosis present

## 2013-11-16 DIAGNOSIS — Z6841 Body Mass Index (BMI) 40.0 and over, adult: Secondary | ICD-10-CM

## 2013-11-16 DIAGNOSIS — D649 Anemia, unspecified: Secondary | ICD-10-CM | POA: Diagnosis present

## 2013-11-16 DIAGNOSIS — I4891 Unspecified atrial fibrillation: Principal | ICD-10-CM | POA: Diagnosis present

## 2013-11-16 DIAGNOSIS — Z953 Presence of xenogenic heart valve: Secondary | ICD-10-CM

## 2013-11-16 DIAGNOSIS — Z79899 Other long term (current) drug therapy: Secondary | ICD-10-CM

## 2013-11-16 DIAGNOSIS — K5900 Constipation, unspecified: Secondary | ICD-10-CM | POA: Diagnosis present

## 2013-11-16 DIAGNOSIS — Z952 Presence of prosthetic heart valve: Secondary | ICD-10-CM

## 2013-11-16 HISTORY — DX: Cardiac arrhythmia, unspecified: I49.9

## 2013-11-16 LAB — CBC WITH DIFFERENTIAL/PLATELET
BASOS ABS: 0 10*3/uL (ref 0.0–0.1)
BASOS PCT: 0 % (ref 0–1)
Eosinophils Absolute: 0 10*3/uL (ref 0.0–0.7)
Eosinophils Relative: 0 % (ref 0–5)
HCT: 28.5 % — ABNORMAL LOW (ref 36.0–46.0)
Hemoglobin: 9.2 g/dL — ABNORMAL LOW (ref 12.0–15.0)
Lymphocytes Relative: 9 % — ABNORMAL LOW (ref 12–46)
Lymphs Abs: 1.2 10*3/uL (ref 0.7–4.0)
MCH: 27 pg (ref 26.0–34.0)
MCHC: 32.3 g/dL (ref 30.0–36.0)
MCV: 83.6 fL (ref 78.0–100.0)
Monocytes Absolute: 0.8 10*3/uL (ref 0.1–1.0)
Monocytes Relative: 5 % (ref 3–12)
NEUTROS PCT: 86 % — AB (ref 43–77)
Neutro Abs: 12 10*3/uL — ABNORMAL HIGH (ref 1.7–7.7)
PLATELETS: 302 10*3/uL (ref 150–400)
RBC: 3.41 MIL/uL — AB (ref 3.87–5.11)
RDW: 16.1 % — ABNORMAL HIGH (ref 11.5–15.5)
WBC: 14.1 10*3/uL — ABNORMAL HIGH (ref 4.0–10.5)

## 2013-11-16 LAB — BASIC METABOLIC PANEL
BUN: 21 mg/dL (ref 6–23)
CO2: 24 mEq/L (ref 19–32)
Calcium: 9.1 mg/dL (ref 8.4–10.5)
Chloride: 92 mEq/L — ABNORMAL LOW (ref 96–112)
Creatinine, Ser: 0.79 mg/dL (ref 0.50–1.10)
GFR calc non Af Amer: 82 mL/min — ABNORMAL LOW (ref >90)
Glucose, Bld: 130 mg/dL — ABNORMAL HIGH (ref 70–99)
POTASSIUM: 4 meq/L (ref 3.7–5.3)
SODIUM: 134 meq/L — AB (ref 137–147)

## 2013-11-16 LAB — URINE MICROSCOPIC-ADD ON

## 2013-11-16 LAB — TROPONIN I: Troponin I: 2.92 ng/mL (ref <0.30)

## 2013-11-16 LAB — POCT I-STAT TROPONIN I: TROPONIN I, POC: 1.11 ng/mL — AB (ref 0.00–0.08)

## 2013-11-16 LAB — GLUCOSE, CAPILLARY: Glucose-Capillary: 137 mg/dL — ABNORMAL HIGH (ref 70–99)

## 2013-11-16 LAB — HEPARIN LEVEL (UNFRACTIONATED)

## 2013-11-16 LAB — URINALYSIS, ROUTINE W REFLEX MICROSCOPIC
Glucose, UA: NEGATIVE mg/dL
Hgb urine dipstick: NEGATIVE
Ketones, ur: NEGATIVE mg/dL
Nitrite: NEGATIVE
Protein, ur: 30 mg/dL — AB
Specific Gravity, Urine: 1.028 (ref 1.005–1.030)
UROBILINOGEN UA: 0.2 mg/dL (ref 0.0–1.0)
pH: 5.5 (ref 5.0–8.0)

## 2013-11-16 LAB — PROTIME-INR
INR: 1.16 (ref 0.00–1.49)
PROTHROMBIN TIME: 14.6 s (ref 11.6–15.2)

## 2013-11-16 LAB — PRO B NATRIURETIC PEPTIDE: Pro B Natriuretic peptide (BNP): 13472 pg/mL — ABNORMAL HIGH (ref 0–125)

## 2013-11-16 LAB — APTT: aPTT: 38 seconds — ABNORMAL HIGH (ref 24–37)

## 2013-11-16 MED ORDER — FUROSEMIDE 10 MG/ML IJ SOLN: 40.0000 mg | INTRAMUSCULAR | Status: DC

## 2013-11-16 MED ORDER — HEPARIN (PORCINE) IN NACL 100-0.45 UNIT/ML-% IJ SOLN
1350.0000 [IU]/h | INTRAMUSCULAR | Status: AC
  Administered 2013-11-16: 1150 [IU]/h via INTRAVENOUS
  Administered 2013-11-17: 1350 [IU]/h via INTRAVENOUS
  Filled 2013-11-16: qty 250

## 2013-11-16 MED ORDER — FUROSEMIDE 10 MG/ML IJ SOLN
80.0000 mg | INTRAMUSCULAR | Status: AC
  Administered 2013-11-16: 80 mg via INTRAVENOUS
  Filled 2013-11-16: qty 8

## 2013-11-16 MED ORDER — ACETAMINOPHEN 325 MG PO TABS
650.0000 mg | ORAL_TABLET | ORAL | Status: DC | PRN
  Administered 2013-11-16 – 2013-11-24 (×6): 650 mg via ORAL
  Filled 2013-11-16 (×7): qty 2

## 2013-11-16 MED ORDER — DILTIAZEM HCL 25 MG/5ML IV SOLN
10.0000 mg | Freq: Once | INTRAVENOUS | Status: AC
  Administered 2013-11-16: 10 mg via INTRAVENOUS
  Filled 2013-11-16: qty 5

## 2013-11-16 MED ORDER — HEPARIN BOLUS VIA INFUSION
4000.0000 [IU] | Freq: Once | INTRAVENOUS | Status: AC
  Administered 2013-11-16: 4000 [IU] via INTRAVENOUS
  Filled 2013-11-16: qty 4000

## 2013-11-16 MED ORDER — DILTIAZEM HCL 100 MG IV SOLR
5.0000 mg/h | Freq: Once | INTRAVENOUS | Status: AC
  Administered 2013-11-16: 5 mg/h via INTRAVENOUS
  Filled 2013-11-16: qty 100

## 2013-11-16 MED ORDER — SODIUM CHLORIDE 0.9 % IV BOLUS (SEPSIS)
500.0000 mL | Freq: Once | INTRAVENOUS | Status: AC
  Administered 2013-11-16: 500 mL via INTRAVENOUS

## 2013-11-16 NOTE — ED Notes (Signed)
Cardiology at bedside.

## 2013-11-16 NOTE — Patient Instructions (Signed)
Share records  with [facility] ER  medical staff seen

## 2013-11-16 NOTE — ED Notes (Signed)
Care management at bedside.

## 2013-11-16 NOTE — Telephone Encounter (Signed)
Janumet refilled per protocol. JG//CMA

## 2013-11-16 NOTE — Progress Notes (Addendum)
ANTICOAGULATION CONSULT NOTE - Initial Consult  Pharmacy Consult for: Heparin  Indication: New onset A.Fib/NSTEMI   Allergies  Allergen Reactions  . Cephalexin Diarrhea    Also stomachache  . Septra [Bactrim]     Yeast infection    Patient Measurements:     Vital Signs: Temp: 98.2 F (36.8 C) (01/12 1641) Temp src: Oral (01/12 1641) BP: 124/55 mmHg (01/12 1641) Pulse Rate: 120 (01/12 1641)  Labs:  Recent Labs  11/16/13 1530  HGB 9.2*  HCT 28.5*  PLT 302  CREATININE 0.79    The CrCl is unknown because both a height and weight (above a minimum accepted value) are required for this calculation.   Medical History: Past Medical History  Diagnosis Date  . Adenomatous polyps   . Hemorrhoids, external   . Constipation, chronic   . Hypertension   . CHF (congestive heart failure)   . Aortic stenosis, moderate   . Hyperlipidemia   . Carotid bruit   . Osteoarthritis   . Edema   . Hemoptysis   . Cancer     BREAST-HX OF  . Dysmetabolic syndrome X   . Low back pain syndrome   . Diabetes mellitus     TYPE 2  . Anemia   . Obesity   . Radiation   . GERD (gastroesophageal reflux disease)     hiatal hernia    Medications:  Scheduled:  Infusions:  PRN:     Assessment:  73 yo F in ER with SOB with new onset A.Fib to start Heparin per pharmacy.    Heparin dosing weight = 80 kg (TBW = 109 kg)  H/H okay, Plt 302  Baseline PTT/PT/INR = Ordered STAT, please draw prior to hanging heparin   Scr is WNL 0.79, estimated CrCl 78 ml/min   Goal of Therapy:  Heparin level 0.3-0.7 units/ml Monitor platelets by anticoagulation protocol: Yes   Plan:  1.) Heparin 4000 units x 1, then start 1150 units/hr 2.) Heparin level 8 hours after gtt starts 3.) Daily CBC and HL, watch for s/sx bleeding 4.) f/u Cardiology recommendations  Glee Arvin PharmD Pager #: [phone] 5:29 PM 11/16/2013

## 2013-11-16 NOTE — ED Provider Notes (Signed)
Pt has hx of AoVR 4 yrs ago and CHF. States she started getting SOB last night. States she didn't take her lasix because she had a doctors appt today. Pt states she is not having palpitations and has never had Afib in the past.   Pt is alert and in no acute distress. Has ? Mild edema of her legs.   Pt has atrial fibrillation with FVR on her monitor. Pt started on diltiazem bolus and drip. She was given IV lasix.    Medical screening examination/treatment/procedure(s) were conducted as a shared visit with non-physician practitioner(s) and myself.  I personally evaluated the patient during the encounter.  EKG Interpretation    Date/Time:  Monday November 16 2013 15:30:11 EST Ventricular Rate:  120 PR Interval:    QRS Duration: 99 QT Interval:  340 QTC Calculation: 480 R Axis:   54 Text Interpretation:  Age not entered, assumed to be  73 years old for purpose of ECG interpretation Atrial fibrillation Borderline repolarization abnormality Borderline prolonged QT interval Since last tracing rate faster Confirmed by [place]  MD-I, Ceferino Lang ([id number]) on 11/16/2013 4:06:15 PM              Rolland Porter, MD, Abram Sander   Janice Norrie, MD 11/16/13 1655

## 2013-11-16 NOTE — ED Provider Notes (Signed)
CSN: [id number]     Arrival date & time 11/16/13  1505 History   First MD Initiated Contact with Patient 11/16/13 1529     Chief Complaint  Patient presents with  . Hypotension  . Tachycardia   (Consider location/radiation/quality/duration/timing/severity/associated sxs/prior Treatment) The history is provided by the patient and medical records.   This is a 73 y.o. F with PMH significnat for HTN, HLP, CHF, s/p aortic valve replacement presenting to the ED from her PCP's office for hypotension and tachycardia.  Pt states she was going to the doctor for a suspected bladder infection as she has been having some dysuria and urinary frequency.  Pt was found to be tachypneic and in AFIB w/RVR, thus sent to the ED for further evaluation.  Pt does note she felt somewhat SOB last night, but denies any at this time.  Has not had any chest pain, palpitations, dizziness, or weakness lately.  Pt states she has not taken her lasix for the past week because she was having such severe diarrhea, since then has had some increased swelling of her LE which seems to improve after elevating her denies.  Unsure of recent weight gain.  Pt has no prior hx of AFIB, currently on 81mg  daily ASA which she has already taken today. Cardiology- Johnsie Cancel PCP- [facility]  Pt also notes some left knee pain.  States she is followed by orthopedics for this and is currently doing PT.  No recent injury, trauma, or falls.  Notes it has become increasingly difficult and painful for her to walk.  Pt uses walker to assist with ambulation at baseline.  Pt on tramadol for pain without significant improvement.   Past Medical History  Diagnosis Date  . Adenomatous polyps   . Hemorrhoids, external   . Constipation, chronic   . Hypertension   . CHF (congestive heart failure)   . Aortic stenosis, moderate   . Hyperlipidemia   . Carotid bruit   . Osteoarthritis   . Edema   . Hemoptysis   . Cancer     BREAST-HX OF  . Dysmetabolic syndrome X    . Low back pain syndrome   . Diabetes mellitus     TYPE 2  . Anemia   . Obesity   . Radiation   . GERD (gastroesophageal reflux disease)     hiatal hernia   Past Surgical History  Procedure Laterality Date  . Colonoscopy      AUGUST 2009  . Abdominal hysterectomy    . Breast lumpectomy      2009  . Tubal ligation    . Polypectomy      2004  . Replacement total knee      BILATERAL  . Aortic valve replacement    . Breast recision      LEFT BREAST RECISION OF INFERIOR MARGIN   Family History  Problem Relation Age of Onset  . Stroke Mother   . Aneurysm Mother   . Kidney disease Father   . Diabetes Sister   . Diabetes Brother   . Heart disease Brother   . Diabetes Sister   . Diabetes Brother   . Colon cancer Other   . Colon polyps Other    History  Substance Use Topics  . Smoking status: Former Smoker    Quit date: 11/13/1972  . Smokeless tobacco: Not on file     Comment: smoked 1968-1974, up to 3 cigarettes  . Alcohol Use: No   OB History   Grav  Para Term Preterm Abortions TAB SAB Ect Mult Living                 Review of Systems  Respiratory: Positive for shortness of breath.   Musculoskeletal: Positive for arthralgias.  All other systems reviewed and are negative.    Allergies  Cephalexin and Septra  Home Medications   Current Outpatient Rx  Name  Route  Sig  Dispense  Refill  . aspirin 81 MG tablet   Oral   Take 81 mg by mouth daily.           . Calcium-Magnesium 500-250 MG TABS   Oral   Take 1 tablet by mouth daily.          . Chromium 200 MCG CAPS   Oral   Take 1 each by mouth daily.           . furosemide (LASIX) 40 MG tablet   Oral   Take 1 tablet (40 mg total) by mouth 2 (two) times daily.   180 tablet   3   . glucose blood (ONE TOUCH ULTRA TEST) test strip      Check blood sugar daily as directed DX:250.00   100 each   3   . JANUMET 50-1000 MG per tablet      TAKE ONE TABLET BY MOUTH TWICE DAILY WITH MEALS   60  tablet   5   . Lancets (ONETOUCH ULTRASOFT) lancets      Check blood sugar daily as directed DX 250.00   100 each   3     Approved on 08/16/11   . losartan (COZAAR) 100 MG tablet      TAKE ONE HALF TABLET BY MOUTH EVERY DAY   90 tablet   1   . meloxicam (MOBIC) 7.5 MG tablet   Oral   Take 7.5 mg by mouth 2 (two) times daily.         . Mometasone Furo-Formoterol Fum (DULERA) 200-5 MCG/ACT AERO   Inhalation   Inhale 2 Inhalers into the lungs 2 (two) times daily as needed.           . montelukast (SINGULAIR) 10 MG tablet   Oral   Take 1 tablet (10 mg total) by mouth at bedtime.   30 tablet   3   . Multiple Vitamin (MULTIVITAMIN) tablet   Oral   Take 1 tablet by mouth daily.           . potassium chloride SA (K-DUR,KLOR-CON) 20 MEQ tablet   Oral   Take 1 tablet (20 mEq total) by mouth 2 (two) times daily.   180 tablet   3   . ranitidine (ZANTAC) 150 MG tablet      TAKE ONE TABLET BY MOUTH TWICE DAILY BEFORE BREAKFAST AND WITH EVENING MEALS   60 tablet   5   . rosuvastatin (CRESTOR) 20 MG tablet      Take one tablet by mouth once daily   30 tablet   5   . traMADol (ULTRAM) 50 MG tablet   Oral   Take 1 tablet (50 mg total) by mouth every 6 (six) hours as needed for pain.   30 tablet   0   . vitamin E 400 UNIT capsule   Oral   Take 400 Units by mouth daily.          BP 102/44  Pulse 138  Temp(Src) 97.3 F (36.3 C) (Oral)  Resp 24  SpO2 99%  Physical Exam  Nursing note and vitals reviewed. Constitutional: She is oriented to person, place, and time. She appears well-developed and well-nourished. No distress.  obese  HENT:  Head: Normocephalic and atraumatic.  Mouth/Throat: Oropharynx is clear and moist.  Eyes: Conjunctivae and EOM are normal. Pupils are equal, round, and reactive to light.  Neck: Normal range of motion. Neck supple.  Cardiovascular: Normal rate, regular rhythm and normal heart sounds.   Pulmonary/Chest: Effort normal and  breath sounds normal. No respiratory distress. She has no wheezes.  Mildly increased work of breathing without accessory muscle use  Abdominal: Soft. Bowel sounds are normal. There is no tenderness. There is no guarding.  Musculoskeletal:       Left knee: She exhibits decreased range of motion. She exhibits no swelling, no effusion, no deformity and no laceration. Tenderness found.  1+ pitting edema BLE  Neurological: She is alert and oriented to person, place, and time.  Skin: Skin is warm and dry. She is not diaphoretic.  Psychiatric: She has a normal mood and affect.    ED Course  Procedures (including critical care time)  Labs Review Labs Reviewed  CBC WITH DIFFERENTIAL - Abnormal; Notable for the following:    WBC 14.1 (*)    RBC 3.41 (*)    Hemoglobin 9.2 (*)    HCT 28.5 (*)    RDW 16.1 (*)    Neutrophils Relative % 86 (*)    Neutro Abs 12.0 (*)    Lymphocytes Relative 9 (*)    All other components within normal limits  BASIC METABOLIC PANEL - Abnormal; Notable for the following:    Sodium 134 (*)    Chloride 92 (*)    Glucose, Bld 130 (*)    GFR calc non Af Amer 82 (*)    All other components within normal limits  PRO B NATRIURETIC PEPTIDE - Abnormal; Notable for the following:    Pro B Natriuretic peptide (BNP) 13472.0 (*)    All other components within normal limits  POCT I-STAT TROPONIN I - Abnormal; Notable for the following:    Troponin i, poc 1.11 (*)    All other components within normal limits  URINALYSIS, ROUTINE W REFLEX MICROSCOPIC  TROPONIN I   Imaging Review No results found.  EKG Interpretation    Date/Time:  Monday November 16 2013 15:30:11 EST Ventricular Rate:  120 PR Interval:    QRS Duration: 99 QT Interval:  340 QTC Calculation: 480 R Axis:   54 Text Interpretation:  Age not entered, assumed to be  73 years old for purpose of ECG interpretation Atrial fibrillation Borderline repolarization abnormality Borderline prolonged QT interval  Since last tracing rate faster Confirmed by KNAPP  MD-I, IVA ([id number]) on 11/16/2013 4:06:15 PM           CRITICAL CARE Performed by: Larene Pickett   Total critical care time: 30  Critical care time was exclusive of separately billable procedures and treating other patients.  Critical care was necessary to treat or prevent imminent or life-threatening deterioration.  Critical care was time spent personally by me on the following activities: development of treatment plan with patient and/or surrogate as well as nursing, discussions with consultants, evaluation of patient's response to treatment, examination of patient, obtaining history from patient or surrogate, ordering and performing treatments and interventions, ordering and review of laboratory studies, ordering and review of radiographic studies, pulse oximetry and re-evaluation of patient's condition.  Medications  heparin ADULT infusion 100 units/mL (25000  units/250 mL) (1,150 Units/hr Intravenous New Bag/Given 11/16/13 2022)  acetaminophen (TYLENOL) tablet 650 mg (650 mg Oral Given 11/16/13 2309)  sodium chloride 0.9 % bolus 500 mL (0 mLs Intravenous Stopped 11/16/13 1702)  diltiazem (CARDIZEM) injection 10 mg (0 mg Intravenous Stopped 11/16/13 1700)  diltiazem (CARDIZEM) 100 mg in dextrose 5 % 100 mL infusion (10 mg/hr Intravenous Rate/Dose Change 11/16/13 2229)  furosemide (LASIX) injection 80 mg (80 mg Intravenous Given 11/16/13 1728)  heparin bolus via infusion 4,000 Units (0 Units Intravenous Stopped 11/16/13 2025)    MDM   1. Atrial fibrillation with RVR   2. Acute diastolic CHF (congestive heart failure), NYHA class 4   3. Aortic valve disorders   4. NSTEMI (non-ST elevated myocardial infarction)   5. S/P aortic valve replacement with porcine valve    EKG AFIB w/RVR, pt has no hx of the same. Pt given 10mg  bolus of cardizem and started on cardizem drip.  Labs as above-- istat trop elevated at 1.11, lab trop pending. BNP  elevated at 13472 and CXR with pulmonary vascular congestion, but no frank pulmonary edema.  Will give additional dose of lasix as Cr WNL.  1700-- went into reassess pt.  States she feels she is breathing better but is beginning to have some chest discomfort.  No palpitations, dizziness, or weakness.  Pt has already taken ASA today.  HR has slowed to 110's, still AFIB.  Consulted cardiology, advised to start on heparin and they will see her in the ED soon.  Pt has been evaluated by cardiology, Dr. Angelena Form, in the ED--pt will be admitted to telemetry floor at [facility].  EMTALA form completed.  VS stable at time of transfer.  Larene Pickett, PA-C 11/17/13 [phone]

## 2013-11-16 NOTE — Progress Notes (Signed)
Pre visit review using our clinic review tool, if applicable. No additional management support is needed unless otherwise documented below in the visit note. 

## 2013-11-16 NOTE — H&P (Signed)
Patient ID: [name] MRN: [id number] DOB/AGE: August 04, 1941 73 y.o. Admit date: 11/16/2013  Primary Care Physician: Linna Darner Primary Cardiologist: Jenkins Rouge  HPI: 73 yo female with history of aortic valve replacement [id number], HTN, diastolic CHF, HLD, obesity, DM, GERD presented to ED at [facility] from her primary care office with c/o SOB, found to be in atrial fibrillation with RVR (HR 105-120). She has not had atrial fibrillation documented prior to today. She is followed by Dr. Johnsie Cancel. Bioprosthetic AVR in January 2011. She first noticed SOB yesterday. No chest pain or awareness of palpitations, irregularity of her heart rhythm. EKG does not show ischemic changes. First troponin is mildly elevated. BNP is elevated at 13,472. She has been given IV Lasix and IV Cardizem with better control of HR and symptomatic improvement. She currently has no complaints.   Review of systems complete and found to be negative unless listed above   Past Medical History  Diagnosis Date  . Adenomatous polyps   . Hemorrhoids, external   . Constipation, chronic   . Hypertension   . CHF (congestive heart failure)   . Aortic stenosis, moderate   . Hyperlipidemia   . Carotid bruit   . Osteoarthritis   . Edema   . Hemoptysis   . Cancer     BREAST-HX OF  . Dysmetabolic syndrome X   . Low back pain syndrome   . Diabetes mellitus     TYPE 2  . Anemia   . Obesity   . Radiation   . GERD (gastroesophageal reflux disease)     hiatal hernia    Family History  Problem Relation Age of Onset  . Stroke Mother   . Aneurysm Mother   . Kidney disease Father   . Diabetes Sister   . Diabetes Brother   . Heart disease Brother   . Diabetes Sister   . Diabetes Brother   . Colon cancer Other   . Colon polyps Other     History   Social History  . Marital Status: Married    Spouse Name: N/A    Number of Children: N/A  . Years of Education: N/A   Occupational History  . Not on file.   Social  History Main Topics  . Smoking status: Former Smoker    Quit date: 11/13/1972  . Smokeless tobacco: Not on file     Comment: smoked 1968-1974, up to 3 cigarettes  . Alcohol Use: No  . Drug Use: No  . Sexual Activity: Not on file   Other Topics Concern  . Not on file   Social History Narrative  . No narrative on file    Past Surgical History  Procedure Laterality Date  . Colonoscopy      AUGUST 2009  . Abdominal hysterectomy    . Breast lumpectomy      2009  . Tubal ligation    . Polypectomy      2004  . Replacement total knee      BILATERAL  . Aortic valve replacement    . Breast recision      LEFT BREAST RECISION OF INFERIOR MARGIN    Allergies  Allergen Reactions  . Cephalexin Diarrhea    Also stomachache  . Tramadol Nausea And Vomiting    "tore her stomach up"  . Septra [Bactrim]     Yeast infection    Prior to Admission Meds:  No current facility-administered medications on file prior to encounter.   Current  Outpatient Prescriptions on File Prior to Encounter  Medication Sig Dispense Refill  . aspirin 81 MG tablet Take 81 mg by mouth daily.        . Calcium-Magnesium 500-250 MG TABS Take 1 tablet by mouth daily.       . Chromium 200 MCG CAPS Take 1 each by mouth daily.        . furosemide (LASIX) 40 MG tablet Take 1 tablet (40 mg total) by mouth 2 (two) times daily.  180 tablet  3  . meloxicam (MOBIC) 7.5 MG tablet Take 7.5 mg by mouth 2 (two) times daily.      . Mometasone Furo-Formoterol Fum (DULERA) 200-5 MCG/ACT AERO Inhale 2 Inhalers into the lungs 2 (two) times daily as needed.        . montelukast (SINGULAIR) 10 MG tablet Take 1 tablet (10 mg total) by mouth at bedtime.  30 tablet  3  . Multiple Vitamin (MULTIVITAMIN) tablet Take 1 tablet by mouth daily.        . potassium chloride SA (K-DUR,KLOR-CON) 20 MEQ tablet Take 1 tablet (20 mEq total) by mouth 2 (two) times daily.  180 tablet  3  . vitamin E 400 UNIT capsule Take 400 Units by mouth daily.         Physical Exam: Blood pressure 124/55, pulse 120, temperature 98.2 F (36.8 C), temperature source Oral, resp. rate 19, height 5' 4.17" (1.63 m), weight 240 lb 15.4 oz (109.3 kg), SpO2 100.00%.    General: Well developed, well nourished, NAD  HEENT: OP clear, mucus membranes moist  SKIN: warm, dry. No rashes.  Neuro: No focal deficits  Musculoskeletal: Muscle strength 5/5 all ext  Psychiatric: Mood and affect normal  Neck: No JVD, no carotid bruits, no thyromegaly, no lymphadenopathy.  Lungs:Clear bilaterally, no wheezes, rhonci, crackles  Cardiovascular: Irregular irregular. Systolic murmur.   Abdomen:Soft. Bowel sounds present. Non-tender.  Extremities: Trace bilateral lower extremity edema. Pulses are 2 + in the bilateral DP/PT.  Labs:   Lab Results  Component Value Date   WBC 14.1* 11/16/2013   HGB 9.2* 11/16/2013   HCT 28.5* 11/16/2013   MCV 83.6 11/16/2013   PLT 302 11/16/2013     Recent Labs Lab 11/16/13 1530  NA 134*  K 4.0  CL 92*  CO2 24  BUN 21  CREATININE 0.79  CALCIUM 9.1  GLUCOSE 130*   Lab Results  Component Value Date             TROPONINI 2.92* 11/16/2013    Chest x-ray:  1. Cardiomegaly with persistent pulmonary vascular congestion and  congestive heart failure.  2. Overall improved aeration.  3. Areas of linear densities bilaterally suggest residual  atelectasis.  EKG: Atrial fibrillation, no acute ST or T wave abnormalities.   Echo 05/27/12: - Left ventricle: Wall thickness was increased in a pattern of mild LVH. Systolic function was normal. The estimated ejection fraction was in the range of 55% to 60%. - Mitral valve: Calcified annulus. - Left atrium: The atrium was moderately dilated. Aortic valve: The prosthetic aortic valve seems to open normally. The measured gradient would suggest severe aortic stenosis but I suspect it is due to a small valve with a hypertrophied LV.  ASSESSMENT AND PLAN:   1. Atrial fibrillation with RVR:  Rate control tonight with IV Diltiazem. Would titrate up to 10 mg/hour in the ED. I have discussed with nursing. Will admit to telemetry unit at [facility]. Will continue IV Diltiazem  tonight. Will start heparin per pharmacy consult. If she does not convert to sinus, will plan TEE guided cardioversion before discharge.    2. NSTEMI: She is not known to have CAD. No chest pain or ischemic EKG changes. Troponin is mildly elevated. This could represent demand ischemia with elevated heart rate/atrial fib.  Will start heparin drip. Will continue ASA. Will cycle cardiac markers. Further risk stratification with possible cardiac cath as hospitalization progresses.   3. Acute CHF: She is not known to have systolic dysfunction. Likely diastolic CHF in setting of atrial fibrillation with RVR. Will continue diuresis with IV Lasix tonight.    4. S/p AVR: Echo in am to evaluate. Last echo noted gradients c/w severe AS but comment on size of valve and LVH likely contributing to this.   Darlina Guys, MD 11/16/2013, 7:20 PM

## 2013-11-16 NOTE — ED Notes (Signed)
Bed: SF68 Expected date:  Expected time:  Means of arrival:  Comments: resus B

## 2013-11-16 NOTE — Progress Notes (Signed)
   CARE MANAGEMENT ED NOTE 11/16/2013  Patient:  [name], [name]   Account Number:  [id number]  Date Initiated:  11/16/2013  Documentation initiated by:  Livia Snellen  Subjective/Objective Assessment:   Patient presents to Ed with urinary complaints but was found to have Afib.     Subjective/Objective Assessment Detail:   This is a 73 y.o. F with PMH significnat for HTN, HLP, CHF, s/p aortic valve replacement presenting to the ED from her PCP's office for hypotension and tachycardia.  Patient with fever of 101.1     Action/Plan:   Patient started on heparin drip and cardizem drip in ED   Action/Plan Detail:   Patient to be admited   Anticipated DC Date:       Status Recommendation to Physician:   Result of Recommendation:    Other ED Services  Consult [facility] [place]  Other    Choice offered to / List presented to:            Status of service:  Completed, signed off  ED Comments:   ED Comments Detail:  EDCM spoke to patient at bedside.  Patient currently lives with her husband and daughter.  Patient reports she has a walker and bedside commode at home.  She is currently receiving services from [facility] for physical therapy.  No other services from [facility].  Patient expresses interest in going to a rehab.  Patient states, "My knee is killing me.  The physical therapy I get at home is not working.  I can barely get around at home it hurts so bad."  Patient has never been in a rehab facility before.  Patient reports she needs someone to cook for her, she can wash herself but has someone standing outside the shower waiting for her.  EDCM explained Medicare guidelines to the patient.  EDCM also explainedwith home health, patient may receive an RN, PT, OT aide and Education officer, museum. EDCM will place order for social worker to speak to patient regarding rehab.  Patient thankful for information.  No further CM needs at this time.

## 2013-11-16 NOTE — ED Notes (Signed)
Bedside xray att

## 2013-11-16 NOTE — ED Notes (Signed)
Pt from PCP, Dr. Linna Darner, sent pt here for low BP and tachycardia. Pt denies weakness and dizziness. Pt states she was having trouble breathing last night. Pt c/o left knee pain.

## 2013-11-16 NOTE — Progress Notes (Signed)
Willodean Rosenthal, patient's husband phone number [phone].

## 2013-11-16 NOTE — Progress Notes (Signed)
   Subjective:    Patient ID: [name], female    DOB: 24-Jul-1941, 73 y.o.   MRN: [id number]  HPI Symptoms began 10/09/13 as decreased ability to ambulate. She feels that after flu shot her left knee "locked up". Prior to that time she was ambulating with a rolling walker.  She saw her orthopedist whom started  tramadol. She states that he has arranged for her to have a "stress test" on the knee in the hospital.  She has been using a topical anti-inflammatory cream of wheat and applying a heating pad over. This has resulted in hyperpigmentation from the excess heat. No    Review of Systems  She has no incontinence of urine or stool. There is no associated rash or change in color of the areas involved.  She also is not having any radicular pain from the hip down the back of the leg.  She has lost 18 # in the context of poor appetite since the viral syndrome in December. Her BP was low @ that visit as well.    Objective:   Physical Exam  She is in no acute distress; she is using a rolling walker. Dramatic weight excess.  She has no lymphadenopathy about the neck or axilla.  Chest is clear with no rales, rhonchi, or wheezing. No increased work of breathing.  She exhibits a tachycardia with an irregular rhythm Pedal pulses are decreased but present.  Hands are normal with no significant joint changes. No cyanosis, clubbing, or significant edema present.  She has fusiform changes of  the knees with crepitus.  Deep tendon reflexes are normal in the upper extremities but decreased at the knees, 0+.  Strength testing opposition in the lower extremities is good.  There is irregular, splotchy hyperpigmentation over the left knee without disruption of the dermis.            Assessment & Plan:  #1 degenerative joint disease, left knee. No clinical suggestion of Guillain Barre syndrome  #2Tachyarrhythmia, rule out atrial fib with rapid ventricular response  #3  hypotension  See orders/recommendations: EKG reveals atrial fibrillation with a rate of 105 & associated hypotension. This was not present 08/03/13. Her cardiologist will be contacted as to deposition

## 2013-11-17 DIAGNOSIS — I059 Rheumatic mitral valve disease, unspecified: Secondary | ICD-10-CM

## 2013-11-17 LAB — BASIC METABOLIC PANEL
BUN: 18 mg/dL (ref 6–23)
CALCIUM: 8.7 mg/dL (ref 8.4–10.5)
CO2: 25 mEq/L (ref 19–32)
CREATININE: 0.67 mg/dL (ref 0.50–1.10)
Chloride: 94 mEq/L — ABNORMAL LOW (ref 96–112)
GFR calc Af Amer: >90 mL/min (ref >90)
GFR, EST NON AFRICAN AMERICAN: 86 mL/min — AB (ref >90)
Glucose, Bld: 117 mg/dL — ABNORMAL HIGH (ref 70–99)
POTASSIUM: 3.6 meq/L — AB (ref 3.7–5.3)
Sodium: 133 mEq/L — ABNORMAL LOW (ref 137–147)

## 2013-11-17 LAB — TROPONIN I
Troponin I: 1.87 ng/mL (ref <0.30)
Troponin I: 1.5 ng/mL (ref <0.30)

## 2013-11-17 LAB — CBC
HEMATOCRIT: 24.7 % — AB (ref 36.0–46.0)
Hemoglobin: 8 g/dL — ABNORMAL LOW (ref 12.0–15.0)
MCH: 26.9 pg (ref 26.0–34.0)
MCHC: 32.4 g/dL (ref 30.0–36.0)
MCV: 83.2 fL (ref 78.0–100.0)
Platelets: 225 10*3/uL (ref 150–400)
RBC: 2.97 MIL/uL — ABNORMAL LOW (ref 3.87–5.11)
RDW: 16.2 % — AB (ref 11.5–15.5)
WBC: 11.3 10*3/uL — ABNORMAL HIGH (ref 4.0–10.5)

## 2013-11-17 LAB — PREPARE RBC (CROSSMATCH)

## 2013-11-17 LAB — GLUCOSE, CAPILLARY
GLUCOSE-CAPILLARY: 109 mg/dL — AB (ref 70–99)
GLUCOSE-CAPILLARY: 143 mg/dL — AB (ref 70–99)
GLUCOSE-CAPILLARY: 209 mg/dL — AB (ref 70–99)
Glucose-Capillary: 134 mg/dL — ABNORMAL HIGH (ref 70–99)

## 2013-11-17 LAB — TSH: TSH: 1.98 u[IU]/mL (ref 0.350–4.500)

## 2013-11-17 LAB — MRSA PCR SCREENING: MRSA by PCR: NEGATIVE

## 2013-11-17 LAB — HEPARIN LEVEL (UNFRACTIONATED): Heparin Unfractionated: <0.1 IU/mL — ABNORMAL LOW (ref 0.30–0.70)

## 2013-11-17 MED ORDER — SITAGLIPTIN PHOS-METFORMIN HCL 50-1000 MG PO TABS: 1.0000 | ORAL_TABLET | Freq: Two times a day (BID) | ORAL | Status: DC

## 2013-11-17 MED ORDER — SODIUM CHLORIDE 0.9 % IJ SOLN: 3.0000 mL | INTRAMUSCULAR | Status: DC | PRN

## 2013-11-17 MED ORDER — FUROSEMIDE 10 MG/ML IJ SOLN
40.0000 mg | Freq: Two times a day (BID) | INTRAMUSCULAR | Status: DC
  Administered 2013-11-17 – 2013-11-21 (×9): 40 mg via INTRAVENOUS
  Filled 2013-11-17 (×14): qty 4

## 2013-11-17 MED ORDER — ROSUVASTATIN CALCIUM 20 MG PO TABS
20.0000 mg | ORAL_TABLET | Freq: Every day | ORAL | Status: DC
  Administered 2013-11-17 – 2013-11-24 (×8): 20 mg via ORAL
  Filled 2013-11-17 (×8): qty 1

## 2013-11-17 MED ORDER — DEXTROSE 5 % IV SOLN
5.0000 mg/h | INTRAVENOUS | Status: DC
Start: 2013-11-17 — End: 2013-11-22

## 2013-11-17 MED ORDER — ONDANSETRON HCL 4 MG/2ML IJ SOLN
4.0000 mg | Freq: Four times a day (QID) | INTRAMUSCULAR | Status: DC | PRN
  Administered 2013-11-17: 4 mg via INTRAVENOUS
  Filled 2013-11-17: qty 2

## 2013-11-17 MED ORDER — PERFLUTREN LIPID MICROSPHERE: 1.0000 mL | INTRAVENOUS | Status: AC | PRN

## 2013-11-17 MED ORDER — SODIUM CHLORIDE 0.9 % IJ SOLN
3.0000 mL | Freq: Two times a day (BID) | INTRAMUSCULAR | Status: DC
  Administered 2013-11-17: 6 mL via INTRAVENOUS
  Administered 2013-11-17 – 2013-11-23 (×10): 3 mL via INTRAVENOUS

## 2013-11-17 MED ORDER — MELOXICAM 7.5 MG PO TABS
7.5000 mg | ORAL_TABLET | Freq: Two times a day (BID) | ORAL | Status: DC
  Administered 2013-11-17 – 2013-11-24 (×15): 7.5 mg via ORAL
  Filled 2013-11-17 (×17): qty 1

## 2013-11-17 MED ORDER — ASPIRIN EC 81 MG PO TBEC
81.0000 mg | DELAYED_RELEASE_TABLET | Freq: Every day | ORAL | Status: DC
  Administered 2013-11-17 – 2013-11-24 (×8): 81 mg via ORAL
  Filled 2013-11-17 (×8): qty 1

## 2013-11-17 MED ORDER — APIXABAN 5 MG PO TABS
5.0000 mg | ORAL_TABLET | Freq: Two times a day (BID) | ORAL | Status: DC
  Administered 2013-11-17 – 2013-11-24 (×15): 5 mg via ORAL
  Filled 2013-11-17 (×17): qty 1

## 2013-11-17 MED ORDER — DILTIAZEM HCL 60 MG PO TABS
60.0000 mg | ORAL_TABLET | Freq: Four times a day (QID) | ORAL | Status: DC
  Administered 2013-11-17 – 2013-11-24 (×25): 60 mg via ORAL
  Filled 2013-11-17 (×32): qty 1

## 2013-11-17 MED ORDER — LINAGLIPTIN 5 MG PO TABS
5.0000 mg | ORAL_TABLET | Freq: Every day | ORAL | Status: DC
  Administered 2013-11-17 – 2013-11-24 (×8): 5 mg via ORAL
  Filled 2013-11-17 (×8): qty 1

## 2013-11-17 MED ORDER — FAMOTIDINE 20 MG PO TABS
20.0000 mg | ORAL_TABLET | Freq: Every day | ORAL | Status: DC
  Administered 2013-11-17 – 2013-11-24 (×8): 20 mg via ORAL
  Filled 2013-11-17 (×8): qty 1

## 2013-11-17 MED ORDER — ALPRAZOLAM 0.25 MG PO TABS
0.2500 mg | ORAL_TABLET | Freq: Two times a day (BID) | ORAL | Status: DC | PRN
  Administered 2013-11-17 – 2013-11-24 (×6): 0.25 mg via ORAL
  Filled 2013-11-17 (×7): qty 1

## 2013-11-17 MED ORDER — HEPARIN BOLUS VIA INFUSION
3000.0000 [IU] | Freq: Once | INTRAVENOUS | Status: AC
Start: 2013-11-17 — End: 2013-11-17
  Administered 2013-11-17: 3000 [IU] via INTRAVENOUS
  Filled 2013-11-17: qty 3000

## 2013-11-17 MED ORDER — METFORMIN HCL 500 MG PO TABS
1000.0000 mg | ORAL_TABLET | Freq: Two times a day (BID) | ORAL | Status: DC
  Administered 2013-11-17 – 2013-11-24 (×14): 1000 mg via ORAL
  Filled 2013-11-17 (×17): qty 2

## 2013-11-17 MED ORDER — DILTIAZEM HCL 60 MG PO TABS
60.0000 mg | ORAL_TABLET | Freq: Four times a day (QID) | ORAL | Status: DC
  Filled 2013-11-17 (×4): qty 1

## 2013-11-17 MED ORDER — ATORVASTATIN CALCIUM 40 MG PO TABS: 40.0000 mg | ORAL_TABLET | Freq: Every day | ORAL | Status: DC

## 2013-11-17 MED ORDER — SODIUM CHLORIDE 0.9 % IV SOLN: 250.0000 mL | INTRAVENOUS | Status: DC | PRN

## 2013-11-17 MED ORDER — SODIUM CHLORIDE 0.9 % IV SOLN
INTRAVENOUS | Status: DC
  Administered 2013-11-18: 500 mL via INTRAVENOUS

## 2013-11-17 MED ORDER — NITROGLYCERIN 0.4 MG SL SUBL: 0.4000 mg | SUBLINGUAL_TABLET | SUBLINGUAL | Status: DC | PRN

## 2013-11-17 MED ORDER — PERFLUTREN LIPID MICROSPHERE
INTRAVENOUS | Status: AC
  Administered 2013-11-17: 2 mL
  Filled 2013-11-17: qty 10

## 2013-11-17 MED ORDER — POTASSIUM CHLORIDE CRYS ER 20 MEQ PO TBCR
20.0000 meq | EXTENDED_RELEASE_TABLET | Freq: Two times a day (BID) | ORAL | Status: DC
  Administered 2013-11-17 – 2013-11-23 (×13): 20 meq via ORAL
  Filled 2013-11-17 (×17): qty 1

## 2013-11-17 MED ORDER — MONTELUKAST SODIUM 10 MG PO TABS
10.0000 mg | ORAL_TABLET | Freq: Every day | ORAL | Status: DC
  Administered 2013-11-17 – 2013-11-23 (×7): 10 mg via ORAL
  Filled 2013-11-17 (×9): qty 1

## 2013-11-17 MED ORDER — MOMETASONE FURO-FORMOTEROL FUM 200-5 MCG/ACT IN AERO
2.0000 | INHALATION_SPRAY | Freq: Two times a day (BID) | RESPIRATORY_TRACT | Status: DC
  Administered 2013-11-17 – 2013-11-24 (×14): 2 via RESPIRATORY_TRACT
  Filled 2013-11-17: qty 8.8

## 2013-11-17 MED ORDER — FUROSEMIDE 10 MG/ML IJ SOLN
40.0000 mg | Freq: Once | INTRAMUSCULAR | Status: AC
  Administered 2013-11-17: 40 mg via INTRAVENOUS

## 2013-11-17 NOTE — ED Notes (Signed)
[facility] Given report at this time

## 2013-11-17 NOTE — Care Management Note (Addendum)
    Page 1 of 2   11/24/2013     2:37:29 PM   CARE MANAGEMENT NOTE 11/24/2013  Patient:  [name], [name]   Account Number:  [id number]  Date Initiated:  11/17/2013  Documentation initiated by:  Elissa Hefty  Subjective/Objective Assessment:   adm w at fib w rvr     Action/Plan:   lives w husband, pcp dr Gwyndolyn Saxon hopper, act w [facility] for hhc   Anticipated DC Date:  11/24/2013   Anticipated DC Plan:  [place] referral  Clinical Social Worker      DC Planning Services  CM consult      [facility] Choice  Resumption Of Svcs/PTA Provider   Choice offered to / List presented to:          [facility] arranged  HH-1 RN  [place] agency  [place]   Status of service:   Medicare Important Message given?   (If response is "NO", the following Medicare IM given date fields will be blank) Date Medicare IM given:   Date Additional Medicare IM given:    Discharge Disposition:  [place]  Per UR Regulation:  Reviewed for med. necessity/level of care/duration of stay  If discussed at [place] of Stay Meetings, dates discussed:   11/24/2013    Comments:  11/24/13 Rickardo Brinegar,RN,BSN [phone] PT DISCHARGED TO SNF TODAY,PER CSW ARRANGEMENTS.  1/13 0945 debbie dowell rn,bsn pt spoke w sw and would like snf for short term rehab.

## 2013-11-17 NOTE — Progress Notes (Addendum)
ANTICOAGULATION CONSULT NOTE - Follow Up Consult  Pharmacy Consult for Heparin  Indication: atrial fibrillation  Allergies  Allergen Reactions  . Cephalexin Diarrhea    Also stomachache  . Tramadol Nausea And Vomiting    "tore her stomach up"  . Septra [Bactrim]     Yeast infection  . Septra [Sulfamethoxazole-Tmp Ds]     "Gave me a yeast infection"    Patient Measurements: Height: 5' 4.17" (163 cm) Weight: 240 lb 15.4 oz (109.3 kg) IBW/kg (Calculated) : 55.1 Heparin Dosing Weight: ~80 kg  Vital Signs: Temp: 98.7 F (37.1 C) (01/13 0349) Temp src: Oral (01/13 0349) BP: 100/76 mmHg (01/13 0430) Pulse Rate: 99 (01/13 0430)  Labs:  Recent Labs  11/16/13 1530 11/16/13 2010 11/17/13 0325  HGB 9.2*  --  8.0*  HCT 28.5*  --  24.7*  PLT 302  --  225  APTT  --  38*  --   LABPROT  --  14.6  --   INR  --  1.16  --   HEPARINUNFRC  --  <0.10* <0.10*  CREATININE 0.79  --  0.67  TROPONINI 2.92*  --   --     Estimated Creatinine Clearance: 78.2 ml/min (by C-G formula based on Cr of 0.67).   Medications:  Heparin 1150 units/hr  Assessment: 73 y/o F on heparin for new afib. HL is undetectable. No issues per RN.   Goal of Therapy:  Heparin level 0.3-0.7 units/ml Monitor platelets by anticoagulation protocol: Yes   Plan:  -Heparin 3000 units BOLUS x 1 -Increase heparin drip to 1350 units/hr -8 hour HL at 1300 -Daily CBC/HL -F/U [facility] plans  Narda Bonds 11/17/2013,4:40 AM  Pharmacy received orders to transition to apixaban for afib. Will start 5mg  bid this am. No dose adjustments warranted. Hgb trending down, guaiac ordered, will give blood today. CBC in am  Erin Hearing PharmD., [facility] Clinical Pharmacist Pager [phone] 11/17/2013 9:40 AM

## 2013-11-17 NOTE — Progress Notes (Signed)
Pt noted to be hypotensive with systolic B/P in 85F-29W. Telephone order Dr. Delane Ginger to hold Cardizem at this time.  Will continue to monitor

## 2013-11-17 NOTE — Progress Notes (Addendum)
Patient ID: [name], female   DOB: 1941-09-04, 73 y.o.   MRN: [id number]    Subjective:  Dyspnea  She wants to go to rehab after d/c as she can't walk and husband is overwhelmed   Objective:  Filed Vitals:   11/17/13 0530 11/17/13 0600 11/17/13 0630 11/17/13 0700  BP: 106/47 103/51 94/49 95/62   Pulse: 90 110 144 107  Temp:    98.3 F (36.8 C)  TempSrc:    Oral  Resp: 24 28 23  32  Height:      Weight:      SpO2: 98% 99% 97% 98%    Intake/Output from previous day:  Intake/Output Summary (Last 24 hours) at 11/17/13 0848 Last data filed at 11/17/13 0226  Gross per 24 hour  Intake      0 ml  Output    200 ml  Net   -200 ml    Physical Exam: Affect appropriate [name]  HEENT: normal Neck supple with no adenopathy JVP normal no bruits no thyromegaly Lungs clear with no wheezing and good diaphragmatic motion Heart:  S1/S2 SEM through AVR   no rub, gallop or click PMI normal Abdomen: benighn, BS positve, no tenderness, no AAA no bruit.  No HSM or HJR Distal pulses intact with no bruits No edema Neuro non-focal Skin warm and dry No muscular weakness   Lab Results: Basic Metabolic Panel:  Recent Labs  11/16/13 1530 11/17/13 0325  NA 134* 133*  K 4.0 3.6*  CL 92* 94*  CO2 24 25  GLUCOSE 130* 117*  BUN 21 18  CREATININE 0.79 0.67  CALCIUM 9.1 8.7   CBC:  Recent Labs  11/16/13 1530 11/17/13 0325  WBC 14.1* 11.3*  NEUTROABS 12.0*  --   HGB 9.2* 8.0*  HCT 28.5* 24.7*  MCV 83.6 83.2  PLT 302 225   Cardiac Enzymes:  Recent Labs  11/16/13 1530 11/17/13 0325 11/17/13 0743  TROPONINI 2.92* 1.87* 1.50*    Imaging: Dg Chest 1 View  11/16/2013   CLINICAL DATA:  Shortness of breath.  Congestive heart failure.  EXAM: CHEST - 1 VIEW  COMPARISON:  One-view chest 11/07/2011.  FINDINGS: The heart is enlarged. The patient is status post median sternotomy for CABG. Atherosclerotic calcifications are noted in the aorta. Aeration  is improved. Mild pulmonary vascular congestion persist. Linear densities bilaterally suggest atelectasis.  IMPRESSION: 1. Cardiomegaly with persistent pulmonary vascular congestion and congestive heart failure. 2. Overall improved aeration. 3. Areas of linear densities bilaterally suggest residual atelectasis.   Electronically Signed   By: Lawrence Santiago M.D.   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 The measured gradient would suggest severe aortic stenosis but I suspect it is due to a small valve with a hypertrophied LV. Doppler: VTI ratio of LVOT to aortic valve: 0.23. Peak velocity ratio of LVOT to aortic valve: 0.22. Mean gradient: 51mm Hg (S). Peak gradient: 61mm Hg (S).   Medications:   . aspirin EC  81 mg Oral Daily  . famotidine  20 mg Oral Daily  . furosemide  40 mg Intravenous BID  . linagliptin  5 mg Oral Daily  . meloxicam  7.5 mg Oral BID  . metFORMIN  1,000 mg Oral BID WC  . mometasone-formoterol  2 puff Inhalation BID  . montelukast  10  mg Oral QHS  . potassium chloride SA  20 mEq Oral BID  . rosuvastatin  20 mg Oral Daily  . sodium chloride  3 mL Intravenous Q12H     . diltiazem (CARDIZEM) infusion Stopped (11/17/13 0226)  . heparin 1,350 Units/hr (11/17/13 0450)    Assessment/Plan:  Afib:  On heparin  Will start eliquis   Echo pending.  Will schedule TEE/DCC for tomorrow   Anemia:  Chronic not clear of etiology  No GI bleeding  Will transfuse one unit and guaic stools  Had  Polyps removed by Dr Fuller Plan in 2012 and is due to have repeat colonoscopy AVR:  Gradients have been high since surgery likely from pressure recovery  TEE will allow visualization  Of leaflets.   Troponin:  No chest pain doubt coronary event nonspecific ST/T wave changes  No bypass with AVR  Rehab:  Her biggest issue is morbid obesity and lack  of mobility due to bilateral knee pain.  PT/OT Social service consult and on d/c should go to rehab   Jenkins Rouge 11/17/2013, 8:48 AM

## 2013-11-17 NOTE — Discharge Instructions (Signed)
Information on my medicine - ELIQUIS (apixaban)  This medication education was reviewed with me or my healthcare representative as part of my discharge preparation.  The pharmacist that spoke with me during my hospital stay was:  Georgina Peer, [facility]  Why was Eliquis prescribed for you? Eliquis was prescribed for you to reduce the risk of forming blood clots that can cause a stroke if you have a medical condition called atrial fibrillation (a type of irregular heartbeat) OR to reduce the risk of a blood clots forming after orthopedic surgery.  What do You need to know about Eliquis ? Take your Eliquis TWICE DAILY - one tablet in the morning and one tablet in the evening with or without food.  It would be best to take the doses about the same time each day.  If you have difficulty swallowing the tablet whole please discuss with your pharmacist how to take the medication safely.  Take Eliquis exactly as prescribed by your doctor and DO NOT stop taking Eliquis without talking to the doctor who prescribed the medication.  Stopping may increase your risk of developing a new clot or stroke.  Refill your prescription before you run out.  After discharge, you should have regular check-up appointments with your healthcare provider that is prescribing your Eliquis.  In the future your dose may need to be changed if your kidney function or weight changes by a significant amount or as you get older.  What do you do if you miss a dose? If you miss a dose, take it as soon as you remember on the same day and resume taking twice daily.  Do not take more than one dose of ELIQUIS at the same time.  Important Safety Information A possible side effect of Eliquis is bleeding. You should call your healthcare provider right away if you experience any of the following:   Bleeding from an injury or your nose that does not stop.   Unusual colored urine (red or dark brown) or unusual colored stools (red or  black).   Unusual bruising for unknown reasons.   A serious fall or if you hit your head (even if there is no bleeding).  Some medicines may interact with Eliquis and might increase your risk of bleeding or clotting while on Eliquis. To help avoid this, consult your healthcare provider or pharmacist prior to using any new prescription or non-prescription medications, including herbals, vitamins, non-steroidal anti-inflammatory drugs (NSAIDs) and supplements.  This website has more information on Eliquis (apixaban): [email or web address].

## 2013-11-17 NOTE — Progress Notes (Addendum)
Clinical Social Work Department CLINICAL SOCIAL WORK PLACEMENT NOTE 11/17/2013  Patient:  [name], [name]  Account Number:  [id number] Admit date:  11/16/2013  Clinical Social Worker:  Ky Barban, Latanya Presser  Date/time:  11/17/2013 10:46 AM  Clinical Social Work is seeking post-discharge placement for this patient at the following level of care:   [facility]   (*CSW will update this form in Epic as items are completed)   11/17/2013  Patient/family provided with [place] Department of Clinical Social Work's list of facilities offering this level of care within the geographic area requested by the patient (or if unable, by the patient's family).  11/17/2013  Patient/family informed of their freedom to choose among providers that offer the needed level of care, that participate in Medicare, Medicaid or managed care program needed by the patient, have an available bed and are willing to accept the patient.  11/17/2013  Patient/family informed of [facility]' ownership interest in [facility], as well as of the fact that they are under no obligation to receive care at this facility.  PASARR submitted to EDS on 11/17/2013 PASARR number received from EDS on 11/17/2013  FL2 transmitted to all facilities in geographic area requested by pt/family on  11/17/2013 FL2 transmitted to all facilities within larger geographic area on 11/18/2013  Patient informed that his/her managed care company has contracts with or will negotiate with  certain facilities, including the following:     Patient/family informed of bed offers received:  11/18/2013 (pt informed [facility] not able to take pt at this time due to flu outbreak--CSW got permission to send clincials to other SNFs and is waiting on offers) Patient chooses bed at  Physician recommends and patient chooses bed at    Patient to be transferred to  on   Patient to be transferred to facility by   The following physician request  were entered in Epic:   Additional Comments:   Ky Barban, MSW, [place] Worker [phone]

## 2013-11-17 NOTE — ED Provider Notes (Signed)
See prior note   Janice Norrie, MD 11/17/13 1501

## 2013-11-17 NOTE — Progress Notes (Signed)
  Echocardiogram 2D Echocardiogram with Definity has been performed.  Johm Pfannenstiel 11/17/2013, 12:52 PM

## 2013-11-17 NOTE — Progress Notes (Signed)
PT Cancellation Note  Patient Details Name: [name] MRN: [id number] DOB: [birth year]   Cancelled Treatment:    Reason Eval/Treat Not Completed: Patient not medically ready. Pt currently with BP 88/25 (42) and not appropriate for mobility at this time.   [name] Beth 11/17/2013, 12:59 PM [name], [place]

## 2013-11-17 NOTE — Progress Notes (Signed)
Clinical Social Work Department BRIEF PSYCHOSOCIAL ASSESSMENT 11/17/2013  Patient:  [name], [name]     Account Number:  [id number]     Admit date:  11/16/2013  Clinical Social Worker:  Freeman Caldron  Date/Time:  11/17/2013 10:36 AM  Referred by:  Physician  Date Referred:  11/17/2013 Referred for  SNF Placement   Other Referral:   Interview type:  Patient Other interview type:    PSYCHOSOCIAL DATA Living Status:  FAMILY Admitted from facility:   Level of care:   Primary support name:  [name] ([phone]) Primary support relationship to patient:  SPOUSE Degree of support available:   Good--pt lives with husband at home and daughter is actively involved in her care.    CURRENT CONCERNS Current Concerns  Post-Acute Placement   Other Concerns:    SOCIAL WORK ASSESSMENT / PLAN Pt explained that she wants to go to rehab for short-term therapy so she can get stronger and go back to her ADLs, specifically cooking and cleaning. She states her daughter has been doing this for her, and her husband, and she misses this level of independence. Pt explained that she loves to cook Arabic food, especially home-made bread and hummus. Pt states she has 9 grandchildren and 2 adult children and she gets to see all relatives frequently. Pt asked CSW send information to [facility] in [place], [place] as this is closest to family. CSW explained that she will send clinicals to [facility] and that [facility] can send information to other SNFs if pt likes. Pt states she wants to hear from [facility] first and then will consider other placement options. CSW will update pt when [facility] responds to bed request.   Assessment/plan status:  Psychosocial Support/Ongoing Assessment of Needs Other assessment/ plan:   Information/referral to community resources:   SNF    PATIENT'S/FAMILY'S RESPONSE TO PLAN OF CARE: Good--pt states that she does not really want to go to rehab, but she feels she needs  to. CSW and pt talked about this at length, and pt believes going to rehab is best for her so she can get back to her routine at home and perform ADLs. Pt friendly and engaged in conversation with CSW, understanding of CSW role in discharge. CSW provided supportive counseling and brief solution-focused therapy interventions to frame pt's decision to pursue SNF placement.       Ky Barban, MSW, [facility] Clinical Social Worker [phone]

## 2013-11-18 ENCOUNTER — Encounter (HOSPITAL_COMMUNITY): Payer: Medicare Other | Admitting: Anesthesiology

## 2013-11-18 ENCOUNTER — Encounter (HOSPITAL_COMMUNITY): Payer: Self-pay | Admitting: Gastroenterology

## 2013-11-18 ENCOUNTER — Encounter (HOSPITAL_COMMUNITY)
Admission: EM | Disposition: A | Discharge status: Skilled Nursing Facility | Payer: Self-pay | Source: Home / Self Care | Attending: Cardiovascular Disease

## 2013-11-18 ENCOUNTER — Inpatient Hospital Stay (HOSPITAL_COMMUNITY): Payer: Medicare Other | Admitting: Anesthesiology

## 2013-11-18 DIAGNOSIS — I2510 Atherosclerotic heart disease of native coronary artery without angina pectoris: Secondary | ICD-10-CM

## 2013-11-18 DIAGNOSIS — D649 Anemia, unspecified: Secondary | ICD-10-CM

## 2013-11-18 DIAGNOSIS — I059 Rheumatic mitral valve disease, unspecified: Secondary | ICD-10-CM

## 2013-11-18 DIAGNOSIS — I4891 Unspecified atrial fibrillation: Secondary | ICD-10-CM

## 2013-11-18 HISTORY — PX: CARDIOVERSION: SHX1299

## 2013-11-18 HISTORY — PX: TEE WITHOUT CARDIOVERSION: SHX5443

## 2013-11-18 LAB — CBC
HEMATOCRIT: 26.7 % — AB (ref 36.0–46.0)
Hemoglobin: 8.9 g/dL — ABNORMAL LOW (ref 12.0–15.0)
MCH: 27.6 pg (ref 26.0–34.0)
MCHC: 33.3 g/dL (ref 30.0–36.0)
MCV: 82.7 fL (ref 78.0–100.0)
Platelets: 237 10*3/uL (ref 150–400)
RBC: 3.23 MIL/uL — AB (ref 3.87–5.11)
RDW: 16.3 % — ABNORMAL HIGH (ref 11.5–15.5)
WBC: 15 10*3/uL — ABNORMAL HIGH (ref 4.0–10.5)

## 2013-11-18 LAB — GLUCOSE, CAPILLARY
GLUCOSE-CAPILLARY: 134 mg/dL — AB (ref 70–99)
Glucose-Capillary: 145 mg/dL — ABNORMAL HIGH (ref 70–99)
Glucose-Capillary: 137 mg/dL — ABNORMAL HIGH (ref 70–99)
Glucose-Capillary: 209 mg/dL — ABNORMAL HIGH (ref 70–99)

## 2013-11-18 LAB — URINE CULTURE

## 2013-11-18 LAB — URINALYSIS W MICROSCOPIC + REFLEX CULTURE
Glucose, UA: NEGATIVE mg/dL
Hgb urine dipstick: NEGATIVE
Ketones, ur: NEGATIVE mg/dL
Nitrite: NEGATIVE
PH: 5 (ref 5.0–8.0)
Protein, ur: 30 mg/dL — AB
SPECIFIC GRAVITY, URINE: 1.022 (ref 1.005–1.030)
Urobilinogen, UA: 0.2 mg/dL (ref 0.0–1.0)

## 2013-11-18 LAB — TYPE AND SCREEN
ABO/RH(D): A POS
Antibody Screen: NEGATIVE
UNIT DIVISION: 0

## 2013-11-18 SURGERY — ECHOCARDIOGRAM, TRANSESOPHAGEAL
Anesthesia: Moderate Sedation

## 2013-11-18 MED ORDER — BUTAMBEN-TETRACAINE-BENZOCAINE 2-2-14 % EX AERO
INHALATION_SPRAY | CUTANEOUS | Status: DC | PRN
  Administered 2013-11-18: 2 via TOPICAL

## 2013-11-18 MED ORDER — SODIUM CHLORIDE 0.9 % IV SOLN
INTRAVENOUS | Status: DC | PRN
  Administered 2013-11-18: 13:00:00 via INTRAVENOUS

## 2013-11-18 MED ORDER — FENTANYL CITRATE 0.05 MG/ML IJ SOLN
INTRAMUSCULAR | Status: DC | PRN
  Administered 2013-11-18 (×2): 25 ug via INTRAVENOUS

## 2013-11-18 MED ORDER — MIDAZOLAM HCL 10 MG/2ML IJ SOLN
INTRAMUSCULAR | Status: DC | PRN
  Administered 2013-11-18 (×2): 2 mg via INTRAVENOUS

## 2013-11-18 MED ORDER — FENTANYL CITRATE 0.05 MG/ML IJ SOLN
INTRAMUSCULAR | Status: AC
  Filled 2013-11-18: qty 2

## 2013-11-18 MED ORDER — MIDAZOLAM HCL 5 MG/ML IJ SOLN
INTRAMUSCULAR | Status: AC
  Filled 2013-11-18: qty 2

## 2013-11-18 MED ORDER — LIDOCAINE HCL (CARDIAC) 20 MG/ML IV SOLN
INTRAVENOUS | Status: DC | PRN
  Administered 2013-11-18: 100 mg via INTRAVENOUS

## 2013-11-18 MED ORDER — PHENYLEPHRINE HCL 10 MG/ML IJ SOLN
INTRAMUSCULAR | Status: DC | PRN
  Administered 2013-11-18: 80 ug via INTRAVENOUS

## 2013-11-18 MED ORDER — PROPOFOL 10 MG/ML IV BOLUS
INTRAVENOUS | Status: DC | PRN
  Administered 2013-11-18: 40 mg via INTRAVENOUS

## 2013-11-18 NOTE — Transfer of Care (Signed)
Immediate Anesthesia Transfer of Care Note  Patient: [name]  Procedure(s) Performed: Procedure(s): TRANSESOPHAGEAL ECHOCARDIOGRAM (TEE) (N/A) CARDIOVERSION (N/A)  Patient Location: PACU and Endoscopy Unit  Anesthesia Type:General  Level of Consciousness: awake, alert , oriented and patient cooperative  Airway & Oxygen Therapy: Patient Spontanous Breathing and Patient connected to nasal cannula oxygen  Post-op Assessment: Report given to PACU RN, Post -op Vital signs reviewed and stable and Patient moving all extremities  Post vital signs: Reviewed and stable  Complications: No apparent anesthesia complications

## 2013-11-18 NOTE — Progress Notes (Signed)
  Echocardiogram Echocardiogram Transesophageal has been performed.  Myalynn Lingle 11/18/2013, 2:33 PM

## 2013-11-18 NOTE — Evaluation (Signed)
Physical Therapy Evaluation Patient Details Name: [name] MRN: [id number] DOB: Dec 11, 1940 Today's Date: 11/18/2013 Time: 1111-1140 PT Time Calculation (min): 29 min  PT Assessment / Plan / Recommendation History of Present Illness  Pt admit for NSTEMi, CHF and afib  Clinical Impression  Pt admitted with above. Pt currently with functional limitations due to the deficits listed below (see PT Problem List). Would benefit from Rehab prior to d/c home.  Pt will benefit from skilled PT to increase their independence and safety with mobility to allow discharge to the venue listed below.     PT Assessment  Patient needs continued PT services    Follow Up Recommendations  CIR;Supervision/Assistance - 24 hour                Equipment Recommendations  Other (comment) (TBA)    Recommendations for Other Services Rehab consult   Frequency Min 3X/week    Precautions / Restrictions Precautions Precautions: Fall Restrictions Weight Bearing Restrictions: No   Pertinent Vitals/Pain Did not push pt as HR 106-127 bpm with little activity and pt going for cardioversion today.        Mobility  Transfers Overall transfer level: Needs assistance Transfers: Sit to/from Stand Sit to Stand: Min assist General transfer comment: cues for hand placement, pt uses momentum Ambulation/Gait Ambulation/Gait assistance: Min assist Ambulation Distance (Feet): 10 Feet Assistive device: Rolling walker (2 wheeled) Gait Pattern/deviations: Step-to pattern;Shuffle;Trunk flexed;Wide base of support Gait velocity interpretation: <1.8 ft/sec, indicative of risk for recurrent falls General Gait Details: Pt very slow.  Needs RW for stability.    Exercises General Exercises - Lower Extremity Long Arc Quad: AAROM;10 reps;Seated Hip Flexion/Marching: AAROM;Both;10 reps;Seated   PT Diagnosis: Generalized weakness  PT Problem List: Decreased activity tolerance;Decreased balance;Decreased  mobility;Decreased knowledge of use of DME;Decreased safety awareness PT Treatment Interventions: Functional mobility training;DME instruction;Gait training;Therapeutic activities;Therapeutic exercise;Balance training;Patient/family education     PT Goals(Current goals can be found in the care plan section) Acute Rehab PT Goals Patient Stated Goal: to go home PT Goal Formulation: With patient Time For Goal Achievement: 11/25/13 Potential to Achieve Goals: Good  Visit Information  Assistance Needed: +2 (for ambulation) History of Present Illness: Pt admit for NSTEMi, CHF and afib       Prior [place] expects to be discharged to:: Private residence Living Arrangements: Spouse/significant other Available Help at Discharge: Family;Available PRN/intermittently Type of Home: House Home Layout: One level Additional Comments: Need to assess home equipment at later date. Prior Function Level of Independence: Independent with assistive device(s) Communication Communication: No difficulties    Cognition  Cognition Arousal/Alertness: Awake/alert Behavior During Therapy: WFL for tasks assessed/performed Overall Cognitive Status: Within Functional Limits for tasks assessed    Extremity/Trunk Assessment Upper Extremity Assessment Upper Extremity Assessment: Defer to OT evaluation;Generalized weakness Lower Extremity Assessment Lower Extremity Assessment: Generalized weakness   Balance Balance Overall balance assessment: Needs assistance;History of Falls Standing balance support: Bilateral upper extremity supported;During functional activity Standing balance-Leahy Scale: Poor  End of Session PT - End of Session Equipment Utilized During Treatment: Gait belt;Oxygen Activity Tolerance: Patient limited by fatigue Patient left: in chair;with call bell/phone within reach Nurse Communication: Mobility status       INGOLD,Caddie Randle 11/18/2013, 1:26 PM [facility] Acute Rehabilitation [phone] (pager)

## 2013-11-18 NOTE — Interval H&P Note (Signed)
History and Physical Interval Note:  11/18/2013 1:20 PM  [name]  has presented today for surgery, with the diagnosis of AFIB  The various methods of treatment have been discussed with the patient and family. After consideration of risks, benefits and other options for treatment, the patient has consented to  Procedure(s): TRANSESOPHAGEAL ECHOCARDIOGRAM (TEE) (N/A) CARDIOVERSION (N/A) as a surgical intervention .  The patient's history has been reviewed, patient examined, no change in status, stable for surgery.  I have reviewed the patient's chart and labs.  Questions were answered to the patient's satisfaction.     Daja Shuping [employer]

## 2013-11-18 NOTE — Anesthesia Preprocedure Evaluation (Addendum)
Anesthesia Evaluation  Patient identified by MRN, date of birth, ID band Patient awake    Reviewed: Allergy & Precautions, H&P , NPO status , Patient's Chart, lab work & pertinent test results, reviewed documented beta blocker date and time   Airway Mallampati: II TM Distance: >3 FB Neck ROM: Full    Dental  (+) Upper Dentures and Poor Dentition   Pulmonary shortness of breath and at rest, former smoker,          Cardiovascular hypertension, Pt. on medications + Past MI, + Peripheral Vascular Disease and +CHF + dysrhythmias  Recent new onset At. Fib.   Neuro/Psych    GI/Hepatic   Endo/Other  diabetes, Well Controlled, Type 2  Renal/GU      Musculoskeletal   Abdominal   Peds  Hematology   Anesthesia Other Findings Lower teeth with lots of plaque; upper plate is out  Reproductive/Obstetrics                          Anesthesia Physical Anesthesia Plan  ASA: III  Anesthesia Plan:    Post-op Pain Management:    Induction:   Airway Management Planned:   Additional Equipment:   Intra-op Plan:   Post-operative Plan:   Informed Consent:   Plan Discussed with:   Anesthesia Plan Comments:         Anesthesia Quick Evaluation

## 2013-11-18 NOTE — H&P (View-Only) (Signed)
Patient ID: [name], female   DOB: 1941/06/21, 73 y.o.   MRN: [id number]    Subjective:  Dyspnea  She wants to go to rehab after d/c as she can't walk and husband is overwhelmed  BP still running low  Objective:  Filed Vitals:   11/18/13 0500 11/18/13 0600 11/18/13 0700 11/18/13 0816  BP:  103/86 97/56   Pulse:  90 97   Temp:   98 F (36.7 C)   TempSrc:   Oral   Resp:  29 23   Height:      Weight: 240 lb 1.3 oz (108.9 kg)     SpO2:  96% 99% 98%    Intake/Output from previous day:  Intake/Output Summary (Last 24 hours) at 11/18/13 0911 Last data filed at 11/18/13 0600  Gross per 24 hour  Intake   1411 ml  Output   1650 ml  Net   -239 ml    Physical Exam: Affect appropriate [name]  HEENT: normal Neck supple with no adenopathy JVP normal no bruits no thyromegaly Lungs clear with no wheezing and good diaphragmatic motion Heart:  S1/S2 SEM through AVR   no rub, gallop or click PMI normal Abdomen: benighn, BS positve, no tenderness, no AAA no bruit.  No HSM or HJR Distal pulses intact with no bruits No edema Neuro non-focal Skin warm and dry No muscular weakness   Lab Results: Basic Metabolic Panel:  Recent Labs  11/16/13 1530 11/17/13 0325  NA 134* 133*  K 4.0 3.6*  CL 92* 94*  CO2 24 25  GLUCOSE 130* 117*  BUN 21 18  CREATININE 0.79 0.67  CALCIUM 9.1 8.7   CBC:  Recent Labs  11/16/13 1530 11/17/13 0325 11/18/13 0210  WBC 14.1* 11.3* 15.0*  NEUTROABS 12.0*  --   --   HGB 9.2* 8.0* 8.9*  HCT 28.5* 24.7* 26.7*  MCV 83.6 83.2 82.7  PLT 302 225 237   Cardiac Enzymes:  Recent Labs  11/16/13 1530 11/17/13 0325 11/17/13 0743  TROPONINI 2.92* 1.87* 1.50*    Imaging: Dg Chest 1 View  11/16/2013   CLINICAL DATA:  Shortness of breath.  Congestive heart failure.  EXAM: CHEST - 1 VIEW  COMPARISON:  One-view chest 11/07/2011.  FINDINGS: The heart is enlarged. The patient is status post median sternotomy for CABG.  Atherosclerotic calcifications are noted in the aorta. Aeration is improved. Mild pulmonary vascular congestion persist. Linear densities bilaterally suggest atelectasis.  IMPRESSION: 1. Cardiomegaly with persistent pulmonary vascular congestion and congestive heart failure. 2. Overall improved aeration. 3. Areas of linear densities bilaterally suggest residual atelectasis.   Electronically Signed   By: Lawrence Santiago M.D.   On: 11/16/2013 16:39    Cardiac Studies:  ECG:  afib nonspecific ST/T wave changes    Telemetry: afib rates 90-110  Echo:  Previous mean gradient 65 ? Pressure recovery but EF normal Echo yesterday EF 30-35%     Medications:   . apixaban  5 mg Oral BID  . aspirin EC  81 mg Oral Daily  . diltiazem  60 mg Oral Q6H  . famotidine  20 mg Oral Daily  . furosemide  40 mg Intravenous BID  . linagliptin  5 mg Oral Daily  . meloxicam  7.5 mg Oral BID  . metFORMIN  1,000 mg Oral BID WC  . mometasone-formoterol  2 puff Inhalation BID  . montelukast  10 mg Oral QHS  . potassium chloride SA  20 mEq Oral  BID  . rosuvastatin  20 mg Oral Daily  . sodium chloride  3 mL Intravenous Q12H     . sodium chloride    . diltiazem (CARDIZEM) infusion 5 mg/hr (11/17/13 0730)    Assessment/Plan:  Afib:  On eliquis  TEE/DCC today hold am lasix and cardizem as BP low  Anemia:  Chronic not clear of etiology  No GI bleeding  Hct up to 26.7 guaic stools  Had  Polyps removed by Dr Fuller Plan in 2012 and is due to have repeat colonoscopy AVR:  Gradients have been high since surgery likely from pressure recovery  TEE will allow visualization  Of leaflets.  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  PT/OT Social service  consult and on d/c should go to rehab   Jenkins Rouge 11/18/2013, 9:11 AM

## 2013-11-18 NOTE — Progress Notes (Signed)
Patient ID: [name], female   DOB: 1941-04-20, 73 y.o.   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  She has had issues with GI bleeding but is on anticoagulation now Have asked Dr Servando Snare to see her regarding possible high risk redo surgery with root enlargement Or possibly medtronic porcine freestyle vale.    She will end up being palliative care if no surgery done as mitral flow valve will continue to rapidly calcify And deteriorate   Jenkins Rouge

## 2013-11-18 NOTE — Preoperative (Signed)
Beta Blockers   Reason not to administer Beta Blockers:Not Applicable 

## 2013-11-18 NOTE — Progress Notes (Signed)
Patient ID: [name], female   DOB: 1941/06/21, 73 y.o.   MRN: [id number]    Subjective:  Dyspnea  She wants to go to rehab after d/c as she can't walk and husband is overwhelmed  BP still running low  Objective:  Filed Vitals:   11/18/13 0500 11/18/13 0600 11/18/13 0700 11/18/13 0816  BP:  103/86 97/56   Pulse:  90 97   Temp:   98 F (36.7 C)   TempSrc:   Oral   Resp:  29 23   Height:      Weight: 240 lb 1.3 oz (108.9 kg)     SpO2:  96% 99% 98%    Intake/Output from previous day:  Intake/Output Summary (Last 24 hours) at 11/18/13 0911 Last data filed at 11/18/13 0600  Gross per 24 hour  Intake   1411 ml  Output   1650 ml  Net   -239 ml    Physical Exam: Affect appropriate [name]  HEENT: normal Neck supple with no adenopathy JVP normal no bruits no thyromegaly Lungs clear with no wheezing and good diaphragmatic motion Heart:  S1/S2 SEM through AVR   no rub, gallop or click PMI normal Abdomen: benighn, BS positve, no tenderness, no AAA no bruit.  No HSM or HJR Distal pulses intact with no bruits No edema Neuro non-focal Skin warm and dry No muscular weakness   Lab Results: Basic Metabolic Panel:  Recent Labs  11/16/13 1530 11/17/13 0325  NA 134* 133*  K 4.0 3.6*  CL 92* 94*  CO2 24 25  GLUCOSE 130* 117*  BUN 21 18  CREATININE 0.79 0.67  CALCIUM 9.1 8.7   CBC:  Recent Labs  11/16/13 1530 11/17/13 0325 11/18/13 0210  WBC 14.1* 11.3* 15.0*  NEUTROABS 12.0*  --   --   HGB 9.2* 8.0* 8.9*  HCT 28.5* 24.7* 26.7*  MCV 83.6 83.2 82.7  PLT 302 225 237   Cardiac Enzymes:  Recent Labs  11/16/13 1530 11/17/13 0325 11/17/13 0743  TROPONINI 2.92* 1.87* 1.50*    Imaging: Dg Chest 1 View  11/16/2013   CLINICAL DATA:  Shortness of breath.  Congestive heart failure.  EXAM: CHEST - 1 VIEW  COMPARISON:  One-view chest 11/07/2011.  FINDINGS: The heart is enlarged. The patient is status post median sternotomy for CABG.  Atherosclerotic calcifications are noted in the aorta. Aeration is improved. Mild pulmonary vascular congestion persist. Linear densities bilaterally suggest atelectasis.  IMPRESSION: 1. Cardiomegaly with persistent pulmonary vascular congestion and congestive heart failure. 2. Overall improved aeration. 3. Areas of linear densities bilaterally suggest residual atelectasis.   Electronically Signed   By: Lawrence Santiago M.D.   On: 11/16/2013 16:39    Cardiac Studies:  ECG:  afib nonspecific ST/T wave changes    Telemetry: afib rates 90-110  Echo:  Previous mean gradient 65 ? Pressure recovery but EF normal Echo yesterday EF 30-35%     Medications:   . apixaban  5 mg Oral BID  . aspirin EC  81 mg Oral Daily  . diltiazem  60 mg Oral Q6H  . famotidine  20 mg Oral Daily  . furosemide  40 mg Intravenous BID  . linagliptin  5 mg Oral Daily  . meloxicam  7.5 mg Oral BID  . metFORMIN  1,000 mg Oral BID WC  . mometasone-formoterol  2 puff Inhalation BID  . montelukast  10 mg Oral QHS  . potassium chloride SA  20 mEq Oral  BID  . rosuvastatin  20 mg Oral Daily  . sodium chloride  3 mL Intravenous Q12H     . sodium chloride    . diltiazem (CARDIZEM) infusion 5 mg/hr (11/17/13 0730)    Assessment/Plan:  Afib:  On eliquis  TEE/DCC today hold am lasix and cardizem as BP low  Anemia:  Chronic not clear of etiology  No GI bleeding  Hct up to 26.7 guaic stools  Had  Polyps removed by Dr Stark in 2012 and is due to have repeat colonoscopy AVR:  Gradients have been high since surgery likely from pressure recovery  TEE will allow visualization  Of leaflets.  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  PT/OT Social service  consult and on d/c should go to rehab   [name] 11/18/2013, 9:11 AM     

## 2013-11-18 NOTE — Progress Notes (Signed)
Pharmacist Heart Failure Core Measure Documentation  Assessment: [name] has an EF documented as 30-35% on 11/17/13 by ECHO.  Rationale: Heart failure patients with left ventricular systolic dysfunction (LVSD) and an EF < 40% should be prescribed an angiotensin converting enzyme inhibitor (ACEI) or angiotensin receptor blocker (ARB) at discharge unless a contraindication is documented in the medical record.  This patient is not currently on an ACEI or ARB for HF.  This note is being placed in the record in order to provide documentation that a contraindication to the use of these agents is present for this encounter.  ACE Inhibitor or Angiotensin Receptor Blocker is contraindicated (specify all that apply)  []   ACEI allergy AND ARB allergy []   Angioedema []   Moderate or severe aortic stenosis []   Hyperkalemia [x]   Hypotension []   Renal artery stenosis []   Worsening renal function, preexisting renal disease or dysfunction   Albertina Parr, PharmD.  Clinical Pharmacist Pager [phone]

## 2013-11-18 NOTE — Anesthesia Postprocedure Evaluation (Signed)
  Anesthesia Post-op Note  Patient: [name]  Procedure(s) Performed: Procedure(s): TRANSESOPHAGEAL ECHOCARDIOGRAM (TEE) (N/A) CARDIOVERSION (N/A)  Patient Location: PACU  Anesthesia Type:MAC  Level of Consciousness: awake, alert  and oriented  Airway and Oxygen Therapy: Patient Spontanous Breathing and Patient connected to nasal cannula oxygen  Post-op Pain: none  Post-op Assessment: Post-op Vital signs reviewed, Patient's Cardiovascular Status Stable, Respiratory Function Stable, Patent Airway, No signs of Nausea or vomiting and Pain level controlled  Post-op Vital Signs: Reviewed and stable  Complications: No apparent anesthesia complications

## 2013-11-18 NOTE — Procedures (Signed)
Electrical Cardioversion Procedure Note TEPHANIE ESCORCIA [id number] 1941-04-15  Procedure: Electrical Cardioversion Indications:  Atrial Fibrillation  Procedure Details Consent: Risks of procedure as well as the alternatives and risks of each were explained to the (patient/caregiver).  Consent for procedure obtained. Time Out: Verified patient identification, verified procedure, site/side was marked, verified correct patient position, special equipment/implants available, medications/allergies/relevent history reviewed, required imaging and test results available.  Performed  Patient placed on cardiac monitor, pulse oximetry, supplemental oxygen as necessary.  Sedation given: Propofol 40 mg IV per anesthesiology.  Pacer pads placed anterior and posterior chest.  Cardioverted 1 time(s).  Cardioverted at [place].  Evaluation Findings: Post procedure EKG shows: NSR Complications: None Patient did tolerate procedure well.   Loralie Champagne 11/18/2013, 2:05 PM

## 2013-11-18 NOTE — CV Procedure (Addendum)
Procedure: TEE  Indication: Atrial fibrillation  Sedation: Versed 4 mg IV, Fentanyl 50 mcg IV  Findings: Please see echo section of chart for full report.  The patient was in rapid atrial fibrillation.  Normal LV size with mild LV hypertrophy.  EF 30-35%, global hypokinesis.  Normal RV size with mildly decreased systolic function.  RV-RA gradient 43 mmHg. The posterior mitral leaflet was calcified and restricted.  Moderate mitral regurgitation.  There was a bioprosthetic aortic valve.  The valve was heavily calcified with some restriction.  The valve also appeared small.  There was evidence for severe bioprosthetic valve stenosis with mean gradient 68 mmHg (averaged over 5 cycles).  Moderate LAE with no LA appendage thrombus.   We will proceed to cardioversion.   Loralie Champagne 11/18/2013 1:54 PM

## 2013-11-18 NOTE — Consult Note (Signed)
[address]       [place] [address]             [phone]        Emelda H Hogland Ferney Medical Record [id number] Date of Birth: 10-08-1941  Referring: Dr. Johnsie Cancel Primary Care: Unice Cobble, MD  Chief Complaint:    Chief Complaint  Patient presents with  . Hypotension  . Tachycardia    History of Present Illness:     This is a 73 year old female with history of aortic valve replacement (Mitralflow bioprosthetic valve, size 21 mm by me  in [id number]), HTN, diastolic CHF, hyperlipdemia, obesity, DM, and GERD who presented to ED at [facility] from her primary care office with c/o SOB. She was found to be in atrial fibrillation with RVR (HR 105-120). She has not had atrial fibrillation documented prior to today. She is followed by Dr. Johnsie Cancel. She first noticed SOB this past "Sunday. She denied chest palpitations, or irregularity of her heart rhythm. EKG did not show ischemic changes. First troponin is mildly elevated. Echo done 11/17/2013 showed LVEF 30-35%, LA moderately dilated, peak AV gradient 57 mm Hg, aortic root normal in size, and mild MR.BNP was elevated at 13,472. She has been given IV Lasix and IV Cardizem with better control of HR and symptomatic improvement. She has also been put on Eliquis. She underwent cardioversion earlier today. She is currently in sinus rhythm in the 70's. She is about to eat dinner and daughter is with her. She has a few liters of oxygen flowing via [place]. Currently, she states her breathing is better, but still has shortness of breath more so with exertion. She denies any chest pain.    Current Activity/ Functional Status: Her mobility is limited (bilateral knee pain and obesity), she needs assistance with overall transfers, is at risk for falls   Zubrod Score: At the time of surgery this patient's most appropriate activity status/level should be described as: [] Normal activity, no symptoms [] Symptoms, fully  ambulatory [] Symptoms, in bed less than or equal to 50% of the time [x] Symptoms, in bed greater than 50% of the time but less than 100% [] Bedridden [] Moribund  Past Medical History  Diagnosis Date  . Adenomatous polyps   . Hemorrhoids, external   . Constipation, chronic   . Hypertension   . CHF (congestive heart failure)   . Aortic stenosis, moderate   . Hyperlipidemia   . Carotid bruit   . Osteoarthritis   . Edema   . Hemoptysis   . Cancer     BREAST-HX OF  . Dysmetabolic syndrome X   . Low back pain syndrome   . Diabetes mellitus     TYPE 2  . Anemia   . Obesity   . Radiation   . GERD (gastroesophageal reflux disease)     hiatal hernia  . Dysrhythmia     afib    Past Surgical History  Procedure Laterality Date  . Colonoscopy      AUGUST 2009  . Abdominal hysterectomy    . Breast lumpectomy      2009  . Tubal ligation    . Polypectomy      20" 04  . Replacement total knee      BILATERAL  . Aortic valve replacement    . Breast recision      LEFT BREAST RECISION OF INFERIOR MARGIN    History  Smoking status  .  Former Smoker  . Quit date: 11/13/1972  Smokeless tobacco  . Not on file    Comment: smoked 1968-1974, up to 3 cigarettes    History  Alcohol Use No    History   Social History  . Marital Status: Married    Spouse Name: N/A    Number of Children: N/A  . Years of Education: N/A   Occupational History  . Not on file.   Social History Main Topics  . Smoking status: Former Smoker    Quit date: 11/13/1972  . Smokeless tobacco: Not on file     Comment: smoked 1968-1974, up to 3 cigarettes  . Alcohol Use: No  . Drug Use: No  . Sexual Activity: Not on file   Other Topics Concern  . Not on file   Social History Narrative  . No narrative on file    Allergies  Allergen Reactions  . Cephalexin Diarrhea    Also stomachache  . Tramadol Nausea And Vomiting    "tore her stomach up"  . Septra [Bactrim]     Yeast infection  .  Septra [Sulfamethoxazole-Tmp Ds]     "Gave me a yeast infection"    Current Facility-Administered Medications  Medication Dose Route Frequency Provider Last Rate Last Dose  . 0.9 %  sodium chloride infusion  250 mL Intravenous PRN Burnell Blanks, MD      . 0.9 %  sodium chloride infusion   Intravenous Continuous Josue Hector, MD 20 mL/hr at 11/18/13 1253 500 mL at 11/18/13 1253  . acetaminophen (TYLENOL) tablet 650 mg  650 mg Oral Q4H PRN Burnell Blanks, MD   650 mg at 11/16/13 2309  . ALPRAZolam Duanne Moron) tablet 0.25 mg  0.25 mg Oral BID PRN Josue Hector, MD   0.25 mg at 11/17/13 2157  . apixaban (ELIQUIS) tablet 5 mg  5 mg Oral BID Georgina Peer, [facility]   5 mg at 11/18/13 [id number]  . aspirin EC tablet 81 mg  81 mg Oral Daily Burnell Blanks, MD   81 mg at 11/18/13 0932  . diltiazem (CARDIZEM) 100 mg in dextrose 5 % 100 mL infusion  5-15 mg/hr Intravenous Titrated Burnell Blanks, MD 5 mL/hr at 11/17/13 0730 5 mg/hr at 11/17/13 0730  . diltiazem (CARDIZEM) tablet 60 mg  60 mg Oral Q6H Josue Hector, MD   60 mg at 11/18/13 0533  . famotidine (PEPCID) tablet 20 mg  20 mg Oral Daily Burnell Blanks, MD   20 mg at 11/18/13 [id number]  . furosemide (LASIX) injection 40 mg  40 mg Intravenous BID Burnell Blanks, MD   40 mg at 11/17/13 1727  . linagliptin (TRADJENTA) tablet 5 mg  5 mg Oral Daily Josue Hector, MD   5 mg at 11/18/13 0932  . meloxicam (MOBIC) tablet 7.5 mg  7.5 mg Oral BID Burnell Blanks, MD   7.5 mg at 11/18/13 [id number]  . metFORMIN (GLUCOPHAGE) tablet 1,000 mg  1,000 mg Oral BID WC Josue Hector, MD   1,000 mg at 11/18/13 0800  . mometasone-formoterol (DULERA) 200-5 MCG/ACT inhaler 2 puff  2 puff Inhalation BID Burnell Blanks, MD   2 puff at 11/18/13 0813  . montelukast (SINGULAIR) tablet 10 mg  10 mg Oral QHS Burnell Blanks, MD   10 mg at 11/17/13 2157  . nitroGLYCERIN (NITROSTAT) SL tablet 0.4 mg  0.4 mg Sublingual Q5 Min x  3 PRN Burnell Blanks, MD      .  ondansetron (ZOFRAN) injection 4 mg  4 mg Intravenous Q6H PRN Burnell Blanks, MD   4 mg at 11/17/13 1500  . potassium chloride SA (K-DUR,KLOR-CON) CR tablet 20 mEq  20 mEq Oral BID Burnell Blanks, MD   20 mEq at 11/18/13 1600  . rosuvastatin (CRESTOR) tablet 20 mg  20 mg Oral Daily Josue Hector, MD   20 mg at 11/17/13 1001  . sodium chloride 0.9 % injection 3 mL  3 mL Intravenous Q12H Burnell Blanks, MD   3 mL at 11/18/13 0928  . sodium chloride 0.9 % injection 3 mL  3 mL Intravenous PRN Burnell Blanks, MD        Prescriptions prior to admission  Medication Sig Dispense Refill  . aspirin 81 MG tablet Take 81 mg by mouth daily.        . Calcium-Magnesium 500-250 MG TABS Take 1 tablet by mouth daily.       . Chromium 200 MCG CAPS Take 1 each by mouth daily.        . furosemide (LASIX) 40 MG tablet Take 1 tablet (40 mg total) by mouth 2 (two) times daily.  180 tablet  3  . meloxicam (MOBIC) 7.5 MG tablet Take 7.5 mg by mouth 2 (two) times daily.      . Mometasone Furo-Formoterol Fum (DULERA) 200-5 MCG/ACT AERO Inhale 2 Inhalers into the lungs 2 (two) times daily as needed.        . montelukast (SINGULAIR) 10 MG tablet Take 1 tablet (10 mg total) by mouth at bedtime.  30 tablet  3  . Multiple Vitamin (MULTIVITAMIN) tablet Take 1 tablet by mouth daily.        . potassium chloride SA (K-DUR,KLOR-CON) 20 MEQ tablet Take 1 tablet (20 mEq total) by mouth 2 (two) times daily.  180 tablet  3  . ranitidine (ZANTAC) 150 MG tablet Take 150 mg by mouth 2 (two) times daily.      . rosuvastatin (CRESTOR) 20 MG tablet Take 20 mg by mouth daily.      . sitaGLIPtin-metformin (JANUMET) 50-1000 MG per tablet Take 1 tablet by mouth 2 (two) times daily with a meal.      . vitamin E 400 UNIT capsule Take 400 Units by mouth daily.        Family History  Problem Relation Age of Onset  . Stroke Mother   . Aneurysm Mother   . Kidney disease  Father   . Diabetes Sister   . Diabetes Brother   . Heart disease Brother   . Diabetes Sister   . Diabetes Brother   . Colon cancer Other   . Colon polyps Other      Review of Systems:     Cardiac Review of Systems: Y or N  Chest Pain [ N   ]  Resting SOB [ Y  ] Exertional SOB  [ Y ]  Orthopnea [ Y ]   Pedal Edema [ Y  ]    Palpitations [ N ] Syncope  [ N ]   Presyncope [  N ]  General Review of Systems: [Y] = yes [  ]=no Constitional: recent weight change [ N ]; anorexia [  N]; fatigue [Y  ]; nausea [ N ]; night sweats Aqua.Slicker  ]; fever [  N]; or chills [ N ]  Eye : blurred vision Aqua.Slicker  ]; diplopia [ N  ]; vision                        changes [  N];  Amaurosis fugax[ N ] Resp: cough Aqua.Slicker  ];  wheezing[N  ];  hemoptysis[  N]; shortness of breath[  Y] GI:  gallstones[ N ], vomiting[ N ];  dysphagia[ N ]; melena[ N ];  hematochezia Aqua.Slicker  ]; heartburn[ N ];   Hx of  Colonoscopy[Y  ]; GU: kidney stones [ N ]; hematuria[ N ];   dysuria [  N];  nocturia[ N ];  history of obstruction [N  ]; urinary frequency [ N ]             Skin: rash, swelling[N  ];, hair loss[N  ];  peripheral                edema[Y  ];  or itching[ N ] Musculosketetal:  joint erythema[ N ];  joint pain[ Y ];  back pain[ N ];  Heme/Lymph: bruising[ N ];  bleeding[ N ];                          anemia[Y  ] Neuro: TIA[ N ];  headaches[N  ];  stroke[N  ];  vertigo[ N ];  seizures[ N ];   paresthesias[  N];  difficulty walking[Y  ];  Psych:depression[N  ]; anxiety[ N ];    Physical Exam: BP 129/66  Pulse 70  Temp(Src) 97 F (36.1 C) (Oral)  Resp 30  Ht 5' 4.17" (1.63 m)  Wt 108.9 kg (240 lb 1.3 oz)  BMI 40.99 kg/m2  SpO2 99%  General appearance: alert, cooperative and no distress HEENT: Head-atraumatic, normocephalic Eyes-EOMI, PERRLA, sclera non icteric Neck-supple Heart: RRR, systolic ejection murmur grade 3-4/6 Lungs: mostly clear;no wheezes, rhonchi, or  rales Abdomen: soft, non-tender; bowel sounds normal; no masses,  no organomegaly Extremities: Mild LE edema Wound: Sternal wound is well healed Neurologic: intact  Diagnostic Studies & Laboratory data:     Recent Radiology Findings:   Dg Chest 1 View  11/16/2013   CLINICAL DATA:  Shortness of breath.  Congestive heart failure.  EXAM: CHEST - 1 VIEW  COMPARISON:  One-view chest 11/07/2011.  FINDINGS: The heart is enlarged. The patient is status post median sternotomy for CABG. Atherosclerotic calcifications are noted in the aorta. Aeration is improved. Mild pulmonary vascular congestion persist. Linear densities bilaterally suggest atelectasis.  IMPRESSION: 1. Cardiomegaly with persistent pulmonary vascular congestion and congestive heart failure. 2. Overall improved aeration. 3. Areas of linear densities bilaterally suggest residual atelectasis.   Electronically Signed   By: Lawrence Santiago M.D.   On: 11/16/2013 16:39   Recent Lab Findings: Lab Results  Component Value Date   WBC 15.0* 11/18/2013   HGB 8.9* 11/18/2013   HCT 26.7* 11/18/2013   PLT 237 11/18/2013   GLUCOSE 117* 11/17/2013   CHOL 203* 06/02/2013   TRIG 109.0 06/02/2013   HDL 46.90 06/02/2013   LDLDIRECT 133.6 06/02/2013   LDLCALC 101* 09/20/2011   ALT 15 06/02/2013   AST 19 06/02/2013   NA 133* 11/17/2013   K 3.6* 11/17/2013   CL 94* 11/17/2013   CREATININE 0.67 11/17/2013   BUN 18 11/17/2013   CO2 25 11/17/2013   TSH 1.980 11/17/2013   INR 1.16 11/16/2013   HGBA1C 6.7* 06/02/2013  Assessment / Plan:      1.Heavily calcified bioprosthetic aortic valve (21 mm Mitralflow)  moderate MR. Patient current very poor medical condition, poor functional status makes redo AVR root replacement and poss mitral repair and procedure with high risk and unlikely to change her over all status much. Discussed with patient    2. Atrial fibrillation-s/p cardioversion. Maintaining sinus rhythm  3. Anemia-H and H 8.9 and 26.7 today. Guaiac to be  ordered per cardiology. Had polyps previously removed in 12'.  Grace Isaac MD      [address] [place], [address] Office [phone]   Beeper [phone]

## 2013-11-18 NOTE — Progress Notes (Addendum)
CSW called [facility] and left admissions a voicemail explaining their facility is the only one pt has requested CSW send clinicals to and pt would not like to consider other options until we hear back from SNF, so CSW requested a call back ASAP. CSW will update pt and see if she would like to consider sending information to other SNFs as a backup.  Addendum: [facility] states they have a flu outbreak and currently cannot take pts. Facility is on a day-to-day basis concerning admissions and CSW will follow-up with SNF as pt gets closer to discharge. CSW will explain this situation to pt and ask for additional rehab facilities as back-up options.  Addendum: CSW updated pt on [facility] situation, and pt suggested CSW call her daughter. CSW called pt's daughter, who was on the other line during phone call, and requested to call CSW back in a few minutes. CSW waiting on return call. Pt expressed some concern for a procedure she is going to at noon, and CSW provided support and said CSW will come check on pt before CSW leaves for the day. Pt expressed appreciation for CSW support and CSW will also update pt after CSW checks in with her daughter concerning back-up SNFs.  Addendum: CSW spoke with pt's daughter Webb Silversmith at noon and Webb Silversmith gave CSW permission to send clinicals to other SNFs and to update her when pt gets bed offers. CSW has sent information to all SNFs in [facility] except [facility], as pt states she is not interested in this one.  Ky Barban, MSW, [facility] Clinical Social Worker [phone]

## 2013-11-18 NOTE — Addendum Note (Signed)
Addendum created 11/18/13 1549 by Terrill Mohr, CRNA   Modules edited: Anesthesia Responsible Staff

## 2013-11-18 NOTE — Progress Notes (Signed)
Rehab Admissions Coordinator Note:  Patient was screened by Cleatrice Burke for appropriateness for an Inpatient Acute Rehab Consult.  At this time, we are recommending await further progress with PT and OT and medical plan of care before determining rehab venue. I will follow.Cleatrice Burke 11/18/2013, 7:39 PM  I can be reached at [phone].

## 2013-11-18 NOTE — Progress Notes (Signed)
CSW called pt's daughter Webb Silversmith and explained that pt has additional bed offers from other SNFs. After reading the list of options, Webb Silversmith explained [facility] is still the first choice but if we have to go with a second choice they would select [facility]. Anne asked CSW to explain to [facility] that pt has had the flu shot, and CSW explained that the facility is not taking any new admissions right now as a policy but that CSW will tell the facility this tomorrow and keep following up with the SNF and reinforce that this is the pt/family first choice. Webb Silversmith thanked [place] for assistance, and CSW will call Webb Silversmith tomorrow to update/check in.   Ky Barban, MSW, [place] Social Worker [phone]

## 2013-11-18 NOTE — Progress Notes (Signed)
OT Cancellation Note  Patient Details Name: [name] MRN: [id number] DOB: October 22, 1941   Cancelled Treatment:    Reason Eval/Treat Not Completed: Patient at procedure or test/ unavailable - will reattempt tomorrow.   [place], OTR/L [phone]  11/18/2013, 1:21 PM

## 2013-11-19 ENCOUNTER — Encounter (HOSPITAL_COMMUNITY): Payer: Self-pay | Admitting: Cardiology

## 2013-11-19 LAB — GLUCOSE, CAPILLARY
Glucose-Capillary: 165 mg/dL — ABNORMAL HIGH (ref 70–99)
Glucose-Capillary: 162 mg/dL — ABNORMAL HIGH (ref 70–99)
Glucose-Capillary: 182 mg/dL — ABNORMAL HIGH (ref 70–99)
Glucose-Capillary: 197 mg/dL — ABNORMAL HIGH (ref 70–99)

## 2013-11-19 NOTE — Progress Notes (Signed)
Patient ID: [name], female   DOB: Jan 26, 1941, 73 y.o.   MRN: [id number]    Subjective:  Improved in NSR post TEE/DCC   Objective:  Filed Vitals:   11/19/13 0327 11/19/13 0623 11/19/13 0630 11/19/13 0727  BP: 119/45 125/64  95/75  Pulse: 62 58  62  Temp: 97.7 F (36.5 C)   97.5 F (36.4 C)  TempSrc: Oral   Oral  Resp: 24 29  31   Height:      Weight:   241 lb 10 oz (109.6 kg)   SpO2: 93% 96%  96%    Intake/Output from previous day:  Intake/Output Summary (Last 24 hours) at 11/19/13 0827 Last data filed at 11/19/13 0800  Gross per 24 hour  Intake    770 ml  Output    950 ml  Net   -180 ml    Physical Exam: Affect appropriate [name]  HEENT: normal Neck supple with no adenopathy JVP normal no bruits no thyromegaly Lungs clear with no wheezing and good diaphragmatic motion Heart:  S1/S2 SEM through AVR   no rub, gallop or click PMI normal Abdomen: benighn, BS positve, no tenderness, no AAA no bruit.  No HSM or HJR Distal pulses intact with no bruits No edema Neuro non-focal Skin warm and dry No muscular weakness   Lab Results: Basic Metabolic Panel:  Recent Labs  11/16/13 1530 11/17/13 0325  NA 134* 133*  K 4.0 3.6*  CL 92* 94*  CO2 24 25  GLUCOSE 130* 117*  BUN 21 18  CREATININE 0.79 0.67  CALCIUM 9.1 8.7   CBC:  Recent Labs  11/16/13 1530 11/17/13 0325 11/18/13 0210  WBC 14.1* 11.3* 15.0*  NEUTROABS 12.0*  --   --   HGB 9.2* 8.0* 8.9*  HCT 28.5* 24.7* 26.7*  MCV 83.6 83.2 82.7  PLT 302 225 237   Cardiac Enzymes:  Recent Labs  11/16/13 1530 11/17/13 0325 11/17/13 0743  TROPONINI 2.92* 1.87* 1.50*    Imaging: No results found.  Cardiac Studies:  ECG:  afib nonspecific ST/T wave changes    Telemetry: NSR rate 70    Echo:  Previous mean gradient 65 ? Pressure recovery but EF normal Echo yesterday EF 30-35%     Medications:   . apixaban  5 mg Oral BID  . aspirin EC  81 mg Oral Daily  .  diltiazem  60 mg Oral Q6H  . famotidine  20 mg Oral Daily  . furosemide  40 mg Intravenous BID  . linagliptin  5 mg Oral Daily  . meloxicam  7.5 mg Oral BID  . metFORMIN  1,000 mg Oral BID WC  . mometasone-formoterol  2 puff Inhalation BID  . montelukast  10 mg Oral QHS  . potassium chloride SA  20 mEq Oral BID  . rosuvastatin  20 mg Oral Daily  . sodium chloride  3 mL Intravenous Q12H     . sodium chloride 500 mL (11/18/13 1253)  . diltiazem (CARDIZEM) infusion 5 mg/hr (11/17/13 0730)    Assessment/Plan:  Afib:  On eliquis  TEE/DCC successful maint NSR  Anemia:  Chronic not clear of etiology  No GI bleeding  Hct up to 26.7 guaic stools  Had  Polyps removed by Dr Fuller Plan in 2012 and is due to have repeat colonoscopy AVR:  Failing mitral flow AVR  Known issues with premature calcification and small size.  Internal diameter Too small for TAVR procedure  Patient willing to have  2nd AVR and I think despite her comorbidities this is  Her best option.   Can try to use stentless valve like medtronic freestyle or do root enlargement.  Consider  W/u of GI bleed with colonoscopy then d/c to rehab with elective cath after 3 weeks of anticoagulation and  Redo  AVR in 3-4 weeks  Dr Shellia Carwin to see today  Troponin:  No chest pain doubt coronary event nonspecific ST/T wave changes  No bypass with AVR  Rehab:  Her biggest issue is morbid obesity and lack of mobility due to bilateral knee pain.  PT/OT Social service consult and on d/c should go to rehab   Jenkins Rouge 11/19/2013, 8:27 AM

## 2013-11-19 NOTE — Progress Notes (Signed)
Physical Therapy Treatment Patient Details Name: [name] MRN: [id number] DOB: [birth year] Today's Date: 11/19/2013 Time: 3086-5784 PT Time Calculation (min): 24 min  PT Assessment / Plan / Recommendation  History of Present Illness Pt admit for NSTEMi, CHF and afib   PT Comments   Pt progressing with mobility and gait with max encouragement to increase distance. Pt can be self-limiting and benefits from encouragement to maximize potential. Chair followed with gait with seated rest between trials. Pt encouraged to walk to bathroom rather than [facility] with nursing staff. Will continue to follow.  Follow Up Recommendations  CIR;Supervision/Assistance - 24 hour     Does the patient have the potential to tolerate intense rehabilitation     Barriers to Discharge        Equipment Recommendations       Recommendations for Other Services    Frequency     Progress towards PT Goals Progress towards PT goals: Progressing toward goals  Plan Current plan remains appropriate    Precautions / Restrictions Precautions Precautions: Fall Restrictions Weight Bearing Restrictions: No   Pertinent Vitals/Pain HR 68-77 sats 88-96% on 2L No pain    Mobility  Transfers Overall transfer level: Needs assistance Transfers: Sit to/from Stand Sit to Stand: Min assist General transfer comment: cues for hand placement, anterior weight shift, pt uses momentum x 3 trials from chair and toilet Ambulation/Gait Ambulation/Gait assistance: Min assist Ambulation Distance (Feet): 35 Feet (10', 35', 30' each with seated rest) Assistive device: Rolling walker (2 wheeled) Gait Pattern/deviations: Step-through pattern;Decreased stride length;Trunk flexed;Shuffle Gait velocity interpretation: <1.8 ft/sec, indicative of risk for recurrent falls General Gait Details: cueing for increased trunk extension, breathing technique and max encouragement to increase gait    Exercises General Exercises - Lower  Extremity Long Arc Quad: Seated;15 reps;AROM Hip Flexion/Marching: AROM;Seated;Both;15 reps Toe Raises: AROM;Seated;Both;15 reps Heel Raises: AROM;Seated;Both;15 reps   PT Diagnosis:    PT Problem List:   PT Treatment Interventions:     PT Goals (current goals can now be found in the care plan section)    Visit Information  Last PT Received On: 11/19/13 Assistance Needed: +1 History of Present Illness: Pt admit for NSTEMi, CHF and afib    Subjective Data      Cognition  Cognition Arousal/Alertness: Awake/alert Behavior During Therapy: WFL for tasks assessed/performed Overall Cognitive Status: Within Functional Limits for tasks assessed    Balance     End of Session PT - End of Session Equipment Utilized During Treatment: Gait belt;Oxygen Activity Tolerance: Patient limited by fatigue Patient left: in chair;with call bell/phone within reach Nurse Communication: Mobility status   GP     [name] [facility] 11/19/2013, 10:21 AM [name], [name]

## 2013-11-19 NOTE — Progress Notes (Signed)
CSW called [facility] and explained that [place] talked with pt's daughter yesterday and her daughter wanted CSW to ask if it would make a difference for admissions to know that pt has had the flu shot. Admissions at SNF explained that this does not make a difference, because all of their residents have had a flu shot and the administration is choosing to not accept new admissions because pts are immunocompromised in the hospital. CSW asked when they might be able to make admissions, and facility explains that if they do not have any new flu cases overnight they can accept pts tomorrow. CSW will continue to be in touch with SNF and will attempt to get pt in this facility when she is medically ready for discharge. Back-up plan is [facility].   Ky Barban, MSW, [facility] Clinical Social Worker [phone]

## 2013-11-19 NOTE — Evaluation (Signed)
Occupational Therapy Evaluation Patient Details Name: [name] MRN: [id number] DOB: 06-11-1941 Today's Date: 11/19/2013 Time: 7829-5621 OT Time Calculation (min): 52 min  OT Assessment / Plan / Recommendation History of present illness Pt admit for NSTEMi, CHF and afib   Clinical Impression   Pt admitted with above.  She demonstrates the below listed deficits and will benefit from continued OT to maximize safety and independence with BADLs.  Pt is deconditioned, and requires min - mod A with BADLs.  Feel she would benefit from CIR    OT Assessment  Patient needs continued OT Services    Follow Up Recommendations  CIR;Supervision/Assistance - 24 hour    Barriers to Discharge      Equipment Recommendations  Tub/shower seat    Recommendations for Other Services    Frequency  Min 2X/week    Precautions / Restrictions Precautions Precautions: Fall Restrictions Weight Bearing Restrictions: No   Pertinent Vitals/Pain     ADL  Eating/Feeding: Set up Where Assessed - Eating/Feeding: Chair Grooming: Wash/dry hands;Wash/dry face;Brushing hair;Set up Where Assessed - Grooming: Supported sitting Upper Body Bathing: Set up Where Assessed - Upper Body Bathing: Supported sitting;Unsupported sitting Lower Body Bathing: Moderate assistance Where Assessed - Lower Body Bathing: Supported sit to stand Upper Body Dressing: Minimal assistance Where Assessed - Upper Body Dressing: Unsupported sitting Lower Body Dressing: Maximal assistance Where Assessed - Lower Body Dressing: Supported sit to stand Toilet Transfer: Minimal assistance Toilet Transfer Method: Sit to stand;Stand pivot Science writer: Bedside commode Toileting - Clothing Manipulation and Hygiene: Moderate assistance Where Assessed - Toileting Clothing Manipulation and Hygiene: Standing Transfers/Ambulation Related to ADLs: min A ADL Comments: Pt unable to access feet - reports she has had difficulty with  this for some time.      OT Diagnosis: Generalized weakness  OT Problem List: Decreased strength;Decreased activity tolerance;Decreased range of motion;Impaired balance (sitting and/or standing);Decreased knowledge of use of DME or AE;Cardiopulmonary status limiting activity OT Treatment Interventions: Self-care/ADL training;Therapeutic exercise;DME and/or AE instruction;Therapeutic activities;Patient/family education;Balance training   OT Goals(Current goals can be found in the care plan section) Acute Rehab OT Goals Patient Stated Goal: to go home OT Goal Formulation: With patient Time For Goal Achievement: 12/03/13 Potential to Achieve Goals: Good ADL Goals Pt Will Perform Grooming: with supervision;standing Pt Will Perform Lower Body Bathing: with supervision;with adaptive equipment;sit to/from stand Pt Will Perform Upper Body Dressing: with supervision;with set-up;sitting Pt Will Perform Lower Body Dressing: with supervision;sit to/from stand;with adaptive equipment Pt Will Transfer to Toilet: with supervision;ambulating;regular height toilet;bedside commode;grab bars Pt Will Perform Toileting - Clothing Manipulation and hygiene: with supervision;sit to/from stand Additional ADL Goal #1: Pt will demonstrate bil. UE strength at least 4/5 in prep for increased UE function during BADLs  Visit Information  Last OT Received On: 11/19/13 Assistance Needed: +1 History of Present Illness: Pt admit for NSTEMi, CHF and afib       Prior Functioning     Home Living Family/patient expects to be discharged to:: Private residence Living Arrangements: Spouse/significant other Available Help at Discharge: Family;Available PRN/intermittently Type of Home: House Home Layout: One level Home Equipment: [place] - 2 wheels;Bedside commode Additional Comments: Pt reports she has been receiving HHPT since Aug Prior Function Level of Independence: Needs assistance Gait / Transfers Assistance  Needed: modified independent ADL's / Homemaking Assistance Needed: Pt reports she required assistance with donning socks.  Unable to access feet for LB  bathing  Communication Communication: No difficulties  Vision/Perception     Cognition  Cognition Arousal/Alertness: Awake/alert Behavior During Therapy: WFL for tasks assessed/performed Overall Cognitive Status: Within Functional Limits for tasks assessed    Extremity/Trunk Assessment Upper Extremity Assessment Upper Extremity Assessment: RUE deficits/detail;LUE deficits/detail RUE Deficits / Details: 4-/5 LUE Deficits / Details: shoulder 2/5; elbow distally 3+/5 Lower Extremity Assessment Lower Extremity Assessment: Defer to PT evaluation Cervical / Trunk Assessment Cervical / Trunk Assessment: Normal     Mobility Transfers Overall transfer level: Needs assistance Equipment used: None Transfers: Sit to/from Stand Sit to Stand: Min assist General transfer comment: cues for hand placement, anterior weight shift, pt uses momentum x 3 trials from chair and toilet     Exercise General Exercises - Upper Extremity Shoulder Flexion: Right;AROM;AAROM;Left;15 reps;Seated;Standing General Exercises - Lower Extremity Long Arc Quad: Seated;15 reps;AROM Hip Flexion/Marching: AROM;Seated;Both;15 reps Toe Raises: AROM;Seated;Both;15 reps Heel Raises: AROM;Seated;Both;15 reps   Balance Balance Overall balance assessment: Needs assistance Sitting-balance support: No upper extremity supported;Feet supported Sitting balance-Leahy Scale: Normal Standing balance support: Single extremity supported Standing balance-Leahy Scale: Poor   End of Session OT - End of Session Equipment Utilized During Treatment: Oxygen Activity Tolerance: Patient tolerated treatment well Patient left: in chair;with call bell/phone within reach;with family/visitor present Nurse Communication: Mobility status  GO     [name] M 11/19/2013,  11:08 AM

## 2013-11-20 LAB — GLUCOSE, CAPILLARY
GLUCOSE-CAPILLARY: 221 mg/dL — AB (ref 70–99)
GLUCOSE-CAPILLARY: 144 mg/dL — AB (ref 70–99)
Glucose-Capillary: 169 mg/dL — ABNORMAL HIGH (ref 70–99)
Glucose-Capillary: 187 mg/dL — ABNORMAL HIGH (ref 70–99)

## 2013-11-20 LAB — URINE CULTURE: Colony Count: 30000

## 2013-11-20 NOTE — Progress Notes (Signed)
CARDIAC REHAB PHASE I   PRE:  Rate/Rhythm: 62SR  BP:  Supine:   Sitting: 128/55  Standing:    SaO2: 96%RA  MODE:  Ambulation: 24 ft outside of room   POST:  Rate/Rhythm: 71 SR  BP:  Supine:   Sitting:   Standing:    SaO2: 93%2L 1050-1200 Pt very afraid of falling. Had to be coached to stand up. asst x 2 to stand. Pt walked 24 ft with large walker and asst x 2 with one asst to follow. Pt sat twice to rest with short walk. Needs much encouragement stating she was going to fall and she was dying. C/o SOB being on 2L. Had to roll pt back to room with pt's RN's help because pt stated had to use BSC for BM. Pt is not appropriate for our services as we do simple ambulation with safety devices., and pt more appropriate for PT services at this time. If she gets where she can walk farther distance with less asst, we will begin seeing again.   Graylon Good, RN BSN  11/20/2013 11:55 AM

## 2013-11-20 NOTE — Progress Notes (Signed)
Patient ID: [name], female   DOB: [birth year], 73 y.o.   MRN: [id number]    Subjective:  Improved in NSR post TEE/DCC  Getting OOB more Appreciate PT/OT help   Objective:  Filed Vitals:   11/19/13 2350 11/20/13 0334 11/20/13 0622 11/20/13 0755  BP:  105/60 112/60 106/61  Pulse:  60 61 63  Temp: 97.5 F (36.4 C) 97.5 F (36.4 C)  98.2 F (36.8 C)  TempSrc: Oral Oral  Oral  Resp:  22 24 24   Height:      Weight:  241 lb 13.5 oz (109.7 kg)    SpO2:  100% 94% 95%    Intake/Output from previous day:  Intake/Output Summary (Last 24 hours) at 11/20/13 0859 Last data filed at 11/20/13 0800  Gross per 24 hour  Intake   1040 ml  Output    925 ml  Net    115 ml    Physical Exam: Affect appropriate [name]  HEENT: normal Neck supple with no adenopathy JVP normal no bruits no thyromegaly Lungs clear with no wheezing and good diaphragmatic motion Heart:  S1/S2 SEM through AVR   no rub, gallop or click PMI normal Abdomen: benighn, BS positve, no tenderness, no AAA no bruit.  No HSM or HJR Distal pulses intact with no bruits No edema Neuro non-focal Skin warm and dry No muscular weakness   Lab Results: CBC:  Recent Labs  11/18/13 0210  WBC 15.0*  HGB 8.9*  HCT 26.7*  MCV 82.7  PLT 237    Imaging: No results found.  Cardiac Studies:  ECG:  afib nonspecific ST/T wave changes    Telemetry: NSR rate 70    Echo:  Previous mean gradient 65 ? Pressure recovery but EF normal Echo yesterday EF 30-35%     Medications:   . apixaban  5 mg Oral BID  . aspirin EC  81 mg Oral Daily  . diltiazem  60 mg Oral Q6H  . famotidine  20 mg Oral Daily  . furosemide  40 mg Intravenous BID  . linagliptin  5 mg Oral Daily  . meloxicam  7.5 mg Oral BID  . metFORMIN  1,000 mg Oral BID WC  . mometasone-formoterol  2 puff Inhalation BID  . montelukast  10 mg Oral QHS  . potassium chloride SA  20 mEq Oral BID  . rosuvastatin  20 mg Oral Daily  .  sodium chloride  3 mL Intravenous Q12H     . sodium chloride 500 mL (11/18/13 1253)  . diltiazem (CARDIZEM) infusion 5 mg/hr (11/17/13 0730)    Assessment/Plan:  Afib:  On eliquis  TEE/DCC successful maint NSR  Anemia:  Chronic not clear of etiology  No GI bleeding  Hct up to 26.7 guaic stools  Had  Polyps removed by Dr Fuller Plan in 2012 and is due to have repeat colonoscopy AVR:  Failing mitral flow AVR  Known issues with premature calcification and small size.  Internal diameter Too small for TAVR procedure  Patient willing to have 2nd AVR and I think despite her comorbidities this is  Her best option.   Can try to use stentless valve like medtronic freestyle or do root enlargement.  Consider  W/u of GI bleed with colonoscopy then d/c to rehab with elective cath after 3 weeks of anticoagulation and  Redo  AVR in 3-4 weeks  Dr Shellia Carwin to see today  Troponin:  No chest pain doubt coronary event nonspecific ST/T wave changes  No bypass with AVR  Rehab:  Her biggest issue is morbid obesity and lack of mobility due to bilateral knee pain.  PT/OT Social service consult and on d/c should go to rehab   Plan:  D/C to rehab ? Monday if bed available   3 weeks of anticoagulation post [facility] with eliquis  Will have GI see needs colonoscopy before redo AVR  Cath in 3-4 weeks with redo AVR Dr Servando Snare in ?4 weeks or so  Needs root enlargement   Jenkins Rouge 11/20/2013, 8:59 AM

## 2013-11-20 NOTE — Progress Notes (Signed)
ANTICOAGULATION CONSULT NOTE - Follow Up Consult  Pharmacy Consult for Eliquis  Indication: atrial fibrillation  Allergies  Allergen Reactions  . Cephalexin Diarrhea    Also stomachache  . Tramadol Nausea And Vomiting    "tore her stomach up"  . Septra [Bactrim]     Yeast infection  . Septra [Sulfamethoxazole-Tmp Ds]     "Gave me a yeast infection"    Patient Measurements: Height: 5' 4.17" (163 cm) Weight: 241 lb 13.5 oz (109.7 kg) IBW/kg (Calculated) : 55.1 Heparin Dosing Weight: ~80 kg  Vital Signs: Temp: 97.5 F (36.4 C) (01/16 0334) Temp src: Oral (01/16 0334) BP: 106/61 mmHg (01/16 0755) Pulse Rate: 63 (01/16 0755)  Labs:  Recent Labs  11/18/13 0210  HGB 8.9*  HCT 26.7*  PLT 237    Estimated Creatinine Clearance: 78.3 ml/min (by C-G formula based on Cr of 0.67).   Assessment: 17 YOF was transitioned from heparin to apixaban for Afib. She is s/p TEE/DCC successfully maintaining NSR. No clear etiology for her chronic anemia. H/H was slightly up to 8.9/26.7. Plt remain stable.  Pt with history of prosthetic AVR. Discussed with MD about possibly switching to coumadin due to mechanical valve but he suggested continuing apixaban since it was a AVR and not an MVR, pt was not anticoagulation PTA and furthermore, apixaban would ensure therapeutic anticoagulation. This is a difficult scenario since there is no clinical literature in the area and since patient is likely going to repeat AVR, she will not be on long-term anticoagulation with apixaban. She is found to have failing mitral flow AVR. Considering GI w/u, then per cardiology, plan for elective cath after 3 weeks of anticoagulation and redo AVR in 3-4 weeks.   Goal of Therapy:  Heparin level 0.3-0.7 units/ml Monitor platelets by anticoagulation protocol: Yes   Plan:  -Continue apixaban 2.5 mg by mouth twice daily -Monitor CBC and s/s of bleeding   Albertina Parr, PharmD.  Clinical Pharmacist Pager  [phone]

## 2013-11-20 NOTE — Progress Notes (Addendum)
Pt's daughter [name] asked CSW if [facility] would be an option for pt; CSW replied that pt does have a bed at [facility]. Pt states this is perfect, as it is only 2.2 miles from pt's home. CSW asked if [facility] is now the first choice and [facility] is the second choice. Waiting on reply from [facility].  Addendum: First choice [facility] for [facility]hab, second choice is [facility]. Facility would need discharge summary today if pt were to be ready for discharge this weekend. CSW will update [place] and pt's daughter [name] concerning pt's disposition/discharge.  Ky Barban, MSW, [facility] Clinical Social Worker [phone]

## 2013-11-21 LAB — GLUCOSE, CAPILLARY
GLUCOSE-CAPILLARY: 167 mg/dL — AB (ref 70–99)
GLUCOSE-CAPILLARY: 132 mg/dL — AB (ref 70–99)
Glucose-Capillary: 129 mg/dL — ABNORMAL HIGH (ref 70–99)
Glucose-Capillary: 183 mg/dL — ABNORMAL HIGH (ref 70–99)

## 2013-11-21 NOTE — Progress Notes (Signed)
Subjective:  Remains orthopneic - slept in chair sitting up Feels weak Hgb increased by approx 1g/dL after 1 unit PRBC Net 0.75 L negative fluid, but weight unchanged SR with atrial bigeminy  Objective:  Temp:  [97.6 F (36.4 C)-98.1 F (36.7 C)] 97.9 F (36.6 C) (01/17 1133) Pulse Rate:  [74] 74 (01/16 2000) Resp:  [15-27] 25 (01/17 1138) BP: (105-132)/(50-70) 132/70 mmHg (01/17 1246) SpO2:  [95 %-99 %] 96 % (01/17 1138) Weight:  [109.5 kg (241 lb 6.5 oz)] 109.5 kg (241 lb 6.5 oz) (01/17 0427) Weight change: -0.2 kg (-7.1 oz)  Intake/Output from previous day: 01/16 0701 - 01/17 0700 In: 960 [P.O.:960] Out: 1150 [Urine:1150]  Intake/Output from this shift: Total I/O In: 120 [P.O.:120] Out: 875 [Urine:875]  Medications: Current Facility-Administered Medications  Medication Dose Route Frequency Provider Last Rate Last Dose  . 0.9 %  sodium chloride infusion  250 mL Intravenous PRN Burnell Blanks, MD      . 0.9 %  sodium chloride infusion   Intravenous Continuous Josue Hector, MD 20 mL/hr at 11/18/13 1253 500 mL at 11/18/13 1253  . acetaminophen (TYLENOL) tablet 650 mg  650 mg Oral Q4H PRN Burnell Blanks, MD   650 mg at 11/19/13 2237  . ALPRAZolam Duanne Moron) tablet 0.25 mg  0.25 mg Oral BID PRN Josue Hector, MD   0.25 mg at 11/19/13 2236  . apixaban (ELIQUIS) tablet 5 mg  5 mg Oral BID Georgina Peer, RPH   5 mg at 11/21/13 1002  . aspirin EC tablet 81 mg  81 mg Oral Daily Burnell Blanks, MD   81 mg at 11/21/13 1002  . diltiazem (CARDIZEM) 100 mg in dextrose 5 % 100 mL infusion  5-15 mg/hr Intravenous Titrated Burnell Blanks, MD 5 mL/hr at 11/17/13 0730 5 mg/hr at 11/17/13 0730  . diltiazem (CARDIZEM) tablet 60 mg  60 mg Oral Q6H Josue Hector, MD   60 mg at 11/21/13 1246  . famotidine (PEPCID) tablet 20 mg  20 mg Oral Daily Burnell Blanks, MD   20 mg at 11/21/13 1246  . furosemide (LASIX) injection 40 mg  40 mg Intravenous BID  Burnell Blanks, MD   40 mg at 11/21/13 0830  . linagliptin (TRADJENTA) tablet 5 mg  5 mg Oral Daily Josue Hector, MD   5 mg at 11/21/13 1002  . meloxicam (MOBIC) tablet 7.5 mg  7.5 mg Oral BID Burnell Blanks, MD   7.5 mg at 11/21/13 1002  . metFORMIN (GLUCOPHAGE) tablet 1,000 mg  1,000 mg Oral BID WC Josue Hector, MD   1,000 mg at 11/21/13 0830  . mometasone-formoterol (DULERA) 200-5 MCG/ACT inhaler 2 puff  2 puff Inhalation BID Burnell Blanks, MD   2 puff at 11/21/13 [phone]  . montelukast (SINGULAIR) tablet 10 mg  10 mg Oral QHS Burnell Blanks, MD   10 mg at 11/20/13 2123  . nitroGLYCERIN (NITROSTAT) SL tablet 0.4 mg  0.4 mg Sublingual Q5 Min x 3 PRN Burnell Blanks, MD      . ondansetron [facility]) injection 4 mg  4 mg Intravenous Q6H PRN Burnell Blanks, MD   4 mg at 11/17/13 1500  . potassium chloride SA (K-DUR,KLOR-CON) CR tablet 20 mEq  20 mEq Oral BID Burnell Blanks, MD   20 mEq at 11/21/13 1002  . rosuvastatin (CRESTOR) tablet 20 mg  20 mg Oral Daily Josue Hector, MD  20 mg at 11/21/13 1002  . sodium chloride 0.9 % injection 3 mL  3 mL Intravenous Q12H Burnell Blanks, MD   3 mL at 11/21/13 1000  . sodium chloride 0.9 % injection 3 mL  3 mL Intravenous PRN Burnell Blanks, MD        Physical Exam: General appearance: alert, cooperative and no distress Neck: no adenopathy, no carotid bruit, supple, symmetrical, trachea midline and thyroid not enlarged, symmetric, no tenderness/mass/nodules Lungs: clear to auscultation bilaterally Heart: regularly irregular rhythm, S1: normal, S2: decreased intensity, no S3 or S4 and systolic murmur: midpeaking 2-3/6. Atrial bigeminy 1/6,     Abdomen: soft, non-tender; bowel sounds normal; no masses,  no organomegaly Extremities: edema 2+ halfway up to knees Pulses: 2+ and symmetric Skin: Skin color, texture, turgor normal. No rashes or lesions Neurologic: Grossly normal  Lab  Results: Results for orders placed during the hospital encounter of 11/16/13 (from the past 48 hour(s))  GLUCOSE, CAPILLARY     Status: Abnormal   Collection Time    11/19/13  4:38 PM      Result Value Range   Glucose-Capillary 182 (*) 70 - 99 mg/dL  GLUCOSE, CAPILLARY     Status: Abnormal   Collection Time    11/19/13  9:31 PM      Result Value Range   Glucose-Capillary 197 (*) 70 - 99 mg/dL  GLUCOSE, CAPILLARY     Status: Abnormal   Collection Time    11/20/13  8:39 AM      Result Value Range   Glucose-Capillary 169 (*) 70 - 99 mg/dL  GLUCOSE, CAPILLARY     Status: Abnormal   Collection Time    11/20/13 12:30 PM      Result Value Range   Glucose-Capillary 187 (*) 70 - 99 mg/dL  GLUCOSE, CAPILLARY     Status: Abnormal   Collection Time    11/20/13  5:06 PM      Result Value Range   Glucose-Capillary 221 (*) 70 - 99 mg/dL  GLUCOSE, CAPILLARY     Status: Abnormal   Collection Time    11/20/13  9:21 PM      Result Value Range   Glucose-Capillary 144 (*) 70 - 99 mg/dL   Comment 1 Documented in Chart     Comment 2 Notify RN    GLUCOSE, CAPILLARY     Status: Abnormal   Collection Time    11/21/13  7:19 AM      Result Value Range   Glucose-Capillary 167 (*) 70 - 99 mg/dL  GLUCOSE, CAPILLARY     Status: Abnormal   Collection Time    11/21/13 11:32 AM      Result Value Range   Glucose-Capillary 129 (*) 70 - 99 mg/dL    Imaging: Imaging results have been reviewed  Assessment:  1. Active Problems: 2.   Atrial fibrillation with RVR 3.   Acute diastolic CHF (congestive heart failure), NYHA class 4 4.   NSTEMI (non-ST elevated myocardial infarction) 5.   S/P aortic valve replacement with porcine valve 6.   Plan:  1. Improved urine output and negative fluid balance, remains in acute HF. Increase diuretics 2. Dr. Everrett Coombe note reviewed 3. She needs hemodynamic "tune-up" and GI workup for anemia/hematochezia before high risk redo AVR surgery. 4. Recheck BMET and  CBC  Time Spent Directly with Patient:  30 minutes  Length of Stay:  LOS: 5 days    [name] 11/21/2013, 12:53 PM

## 2013-11-22 LAB — CBC
HEMATOCRIT: 28.3 % — AB (ref 36.0–46.0)
HEMOGLOBIN: 9.3 g/dL — AB (ref 12.0–15.0)
MCH: 27.3 pg (ref 26.0–34.0)
MCHC: 32.9 g/dL (ref 30.0–36.0)
MCV: 83 fL (ref 78.0–100.0)
Platelets: 255 10*3/uL (ref 150–400)
RBC: 3.41 MIL/uL — ABNORMAL LOW (ref 3.87–5.11)
RDW: 17.3 % — AB (ref 11.5–15.5)
WBC: 10.6 10*3/uL — ABNORMAL HIGH (ref 4.0–10.5)

## 2013-11-22 LAB — GLUCOSE, CAPILLARY
GLUCOSE-CAPILLARY: 188 mg/dL — AB (ref 70–99)
Glucose-Capillary: 173 mg/dL — ABNORMAL HIGH (ref 70–99)
Glucose-Capillary: 154 mg/dL — ABNORMAL HIGH (ref 70–99)
Glucose-Capillary: 162 mg/dL — ABNORMAL HIGH (ref 70–99)

## 2013-11-22 MED ORDER — AMIODARONE LOAD VIA INFUSION
150.0000 mg | Freq: Once | INTRAVENOUS | Status: AC
  Administered 2013-11-22: 150 mg via INTRAVENOUS
  Filled 2013-11-22: qty 83.34

## 2013-11-22 MED ORDER — AMIODARONE HCL IN DEXTROSE 360-4.14 MG/200ML-% IV SOLN
30.0000 mg/h | INTRAVENOUS | Status: DC
  Administered 2013-11-23 – 2013-11-24 (×3): 30 mg/h via INTRAVENOUS
  Filled 2013-11-22 (×8): qty 200

## 2013-11-22 MED ORDER — AMIODARONE HCL IN DEXTROSE 360-4.14 MG/200ML-% IV SOLN
60.0000 mg/h | INTRAVENOUS | Status: AC
  Administered 2013-11-22 (×2): 60 mg/h via INTRAVENOUS
  Filled 2013-11-22: qty 400

## 2013-11-22 MED ORDER — FUROSEMIDE 10 MG/ML IJ SOLN
80.0000 mg | Freq: Two times a day (BID) | INTRAMUSCULAR | Status: DC
Start: 2013-11-22 — End: 2013-11-22

## 2013-11-22 MED ORDER — FUROSEMIDE 10 MG/ML IJ SOLN
80.0000 mg | Freq: Two times a day (BID) | INTRAMUSCULAR | Status: DC
  Administered 2013-11-22 – 2013-11-24 (×4): 80 mg via INTRAVENOUS
  Filled 2013-11-22 (×7): qty 8

## 2013-11-22 NOTE — Progress Notes (Signed)
Subjective:  Better diuresis, net -1.6L, weight down 1 kg. Still very edematous and orthopneic Lower frequency of PACs  Objective:  Temp:  [97.2 F (36.2 C)-98.3 F (36.8 C)] 97.2 F (36.2 C) (01/18 0810) Pulse Rate:  [85] 85 (01/18 0810) Resp:  [17-27] 27 (01/18 0812) BP: (113-141)/(49-85) 114/51 mmHg (01/18 0812) SpO2:  [93 %-97 %] 93 % (01/18 0854) Weight:  [108.4 kg (238 lb 15.7 oz)] 108.4 kg (238 lb 15.7 oz) (01/18 0500) Weight change: -1.1 kg (-2 lb 6.8 oz)  Intake/Output from previous day: 01/17 0701 - 01/18 0700 In: 420 [P.O.:420] Out: 1950 [Urine:1950]  Intake/Output from this shift: Total I/O In: 220 [P.O.:220] Out: -   Medications: Current Facility-Administered Medications  Medication Dose Route Frequency Provider Last Rate Last Dose  . 0.9 %  sodium chloride infusion  250 mL Intravenous PRN Burnell Blanks, MD      . 0.9 %  sodium chloride infusion   Intravenous Continuous Josue Hector, MD 20 mL/hr at 11/18/13 1253 500 mL at 11/18/13 1253  . acetaminophen (TYLENOL) tablet 650 mg  650 mg Oral Q4H PRN Burnell Blanks, MD   650 mg at 11/19/13 2237  . ALPRAZolam Duanne Moron) tablet 0.25 mg  0.25 mg Oral BID PRN Josue Hector, MD   0.25 mg at 11/22/13 0008  . apixaban (ELIQUIS) tablet 5 mg  5 mg Oral BID Georgina Peer, [facility]   5 mg at 11/21/13 2141  . aspirin EC tablet 81 mg  81 mg Oral Daily Burnell Blanks, MD   81 mg at 11/21/13 1002  . diltiazem (CARDIZEM) 100 mg in dextrose 5 % 100 mL infusion  5-15 mg/hr Intravenous Titrated Burnell Blanks, MD 5 mL/hr at 11/17/13 0730 5 mg/hr at 11/17/13 0730  . diltiazem (CARDIZEM) tablet 60 mg  60 mg Oral Q6H Josue Hector, MD   60 mg at 11/22/13 0607  . famotidine (PEPCID) tablet 20 mg  20 mg Oral Daily Burnell Blanks, MD   20 mg at 11/21/13 1246  . furosemide (LASIX) injection 40 mg  40 mg Intravenous BID Burnell Blanks, MD   40 mg at 11/21/13 1842  . linagliptin (TRADJENTA)  tablet 5 mg  5 mg Oral Daily Josue Hector, MD   5 mg at 11/21/13 1002  . meloxicam (MOBIC) tablet 7.5 mg  7.5 mg Oral BID Burnell Blanks, MD   7.5 mg at 11/21/13 2141  . metFORMIN (GLUCOPHAGE) tablet 1,000 mg  1,000 mg Oral BID WC Josue Hector, MD   1,000 mg at 11/21/13 1841  . mometasone-formoterol (DULERA) 200-5 MCG/ACT inhaler 2 puff  2 puff Inhalation BID Burnell Blanks, MD   2 puff at 11/22/13 [phone]  . montelukast (SINGULAIR) tablet 10 mg  10 mg Oral QHS Burnell Blanks, MD   10 mg at 11/21/13 2142  . nitroGLYCERIN (NITROSTAT) SL tablet 0.4 mg  0.4 mg Sublingual Q5 Min x 3 PRN Burnell Blanks, MD      . ondansetron [facility]) injection 4 mg  4 mg Intravenous Q6H PRN Burnell Blanks, MD   4 mg at 11/17/13 1500  . potassium chloride SA (K-DUR,KLOR-CON) CR tablet 20 mEq  20 mEq Oral BID Burnell Blanks, MD   20 mEq at 11/21/13 2143  . rosuvastatin (CRESTOR) tablet 20 mg  20 mg Oral Daily Josue Hector, MD   20 mg at 11/21/13 1002  . sodium chloride 0.9 %  injection 3 mL  3 mL Intravenous Q12H Burnell Blanks, MD   3 mL at 11/21/13 2143  . sodium chloride 0.9 % injection 3 mL  3 mL Intravenous PRN Burnell Blanks, MD        Physical Exam: General appearance: alert, cooperative and no distress  Neck: no adenopathy, no carotid bruit, supple, symmetrical, trachea midline and thyroid not enlarged, symmetric, no tenderness/mass/nodules  Lungs: clear to auscultation bilaterally  Heart: regularly irregular rhythm, S1: normal, S2: decreased intensity, no S3 or S4 and systolic murmur: midpeaking 2-3/6.   Abdomen: soft, non-tender; bowel sounds normal; no masses, no organomegaly  Extremities: edema 2+ halfway up to knees  Pulses: 2+ and symmetric  Skin: Skin color, texture, turgor normal. No rashes or lesions  Neurologic: Grossly normal   Lab Results: Results for orders placed during the hospital encounter of 11/16/13 (from the past 48  hour(s))  GLUCOSE, CAPILLARY     Status: Abnormal   Collection Time    11/20/13 12:30 PM      Result Value Range   Glucose-Capillary 187 (*) 70 - 99 mg/dL  GLUCOSE, CAPILLARY     Status: Abnormal   Collection Time    11/20/13  5:06 PM      Result Value Range   Glucose-Capillary 221 (*) 70 - 99 mg/dL  GLUCOSE, CAPILLARY     Status: Abnormal   Collection Time    11/20/13  9:21 PM      Result Value Range   Glucose-Capillary 144 (*) 70 - 99 mg/dL   Comment 1 Documented in Chart     Comment 2 Notify RN    GLUCOSE, CAPILLARY     Status: Abnormal   Collection Time    11/21/13  7:19 AM      Result Value Range   Glucose-Capillary 167 (*) 70 - 99 mg/dL  GLUCOSE, CAPILLARY     Status: Abnormal   Collection Time    11/21/13 11:32 AM      Result Value Range   Glucose-Capillary 129 (*) 70 - 99 mg/dL  GLUCOSE, CAPILLARY     Status: Abnormal   Collection Time    11/21/13  5:12 PM      Result Value Range   Glucose-Capillary 132 (*) 70 - 99 mg/dL  GLUCOSE, CAPILLARY     Status: Abnormal   Collection Time    11/21/13  9:24 PM      Result Value Range   Glucose-Capillary 183 (*) 70 - 99 mg/dL  CBC     Status: Abnormal   Collection Time    11/22/13  3:50 AM      Result Value Range   WBC 10.6 (*) 4.0 - 10.5 K/uL   RBC 3.41 (*) 3.87 - 5.11 MIL/uL   Hemoglobin 9.3 (*) 12.0 - 15.0 g/dL   HCT 28.3 (*) 36.0 - 46.0 %   MCV 83.0  78.0 - 100.0 fL   MCH 27.3  26.0 - 34.0 pg   MCHC 32.9  30.0 - 36.0 g/dL   RDW 17.3 (*) 11.5 - 15.5 %   Platelets 255  150 - 400 K/uL  GLUCOSE, CAPILLARY     Status: Abnormal   Collection Time    11/22/13  8:09 AM      Result Value Range   Glucose-Capillary 173 (*) 70 - 99 mg/dL    Imaging: Imaging results have been reviewed and No results found.  Assessment:  1. Active Problems: 2.   Atrial fibrillation with  RVR 3.   Acute diastolic CHF (congestive heart failure), NYHA class 4 4.   NSTEMI (non-ST elevated myocardial infarction) 5.   S/P aortic valve  replacement with porcine valve 6. Aortic stenosis, severe  Plan:  1. Continue gradual diuresis. No problems with BP or renal function, will increase diuretic dose 2. Maintaining NSR  Time Spent Directly with Patient:  30 minutes  Length of Stay:  LOS: 6 days    Jamarie Mussa 11/22/2013, 10:11 AM

## 2013-11-22 NOTE — Progress Notes (Signed)
  Amiodarone Drug - Drug Interaction Consult Note  Recommendations: Monitor for myopathies, bleeding, bradycardia, hypokalemia, and hypoglycemia.  Amiodarone is metabolized by the cytochrome P450 system and therefore has the potential to cause many drug interactions. Amiodarone has an average plasma half-life of 50 days (range 20 to 100 days).   There is potential for drug interactions to occur several weeks or months after stopping treatment and the onset of drug interactions may be slow after initiating amiodarone.   [x]  Statins: Increased risk of myopathy. Simvastatin- restrict dose to 20mg  daily. Other statins: counsel patients to report any muscle pain or weakness immediately.  [x]  Anticoagulants: Amiodarone can increase anticoagulant effect. Consider warfarin dose reduction. Patients should be monitored closely and the dose of anticoagulant altered accordingly, remembering that amiodarone levels take several weeks to stabilize.  [x]   Calcium channel blockers (diltiazem and verapamil): increased risk of bradycardia, AV block and myocardial depression.  [x]  Diuretics: increased risk of cardiotoxicity if hypokalemia occurs.  [x]  Oral hypoglycemic agents (glyburide, glipizide, glimepiride): increased risk of hypoglycemia. Patient's glucose levels should be monitored closely when initiating amiodarone therapy.    Thank You,  Erik Obey  11/22/2013 3:15 PM

## 2013-11-22 NOTE — Progress Notes (Signed)
Recurrence of AF, mild RVR. Will start amiodarone. No change in symptoms at this time.

## 2013-11-23 ENCOUNTER — Ambulatory Visit (HOSPITAL_COMMUNITY): Admission: RE | Admit: 2013-11-23 | Payer: Medicare Other | Source: Ambulatory Visit

## 2013-11-23 ENCOUNTER — Encounter (HOSPITAL_COMMUNITY): Payer: Medicare Other

## 2013-11-23 LAB — BASIC METABOLIC PANEL
BUN: 23 mg/dL (ref 6–23)
CHLORIDE: 94 meq/L — AB (ref 96–112)
CO2: 23 meq/L (ref 19–32)
Calcium: 8.5 mg/dL (ref 8.4–10.5)
Creatinine, Ser: 0.73 mg/dL (ref 0.50–1.10)
GFR calc non Af Amer: 84 mL/min — ABNORMAL LOW (ref >90)
Glucose, Bld: 159 mg/dL — ABNORMAL HIGH (ref 70–99)
Potassium: 3.4 mEq/L — ABNORMAL LOW (ref 3.7–5.3)
Sodium: 135 mEq/L — ABNORMAL LOW (ref 137–147)

## 2013-11-23 LAB — GLUCOSE, CAPILLARY
GLUCOSE-CAPILLARY: 183 mg/dL — AB (ref 70–99)
Glucose-Capillary: 168 mg/dL — ABNORMAL HIGH (ref 70–99)
Glucose-Capillary: 136 mg/dL — ABNORMAL HIGH (ref 70–99)

## 2013-11-23 MED ORDER — SODIUM CHLORIDE 0.45 % IV SOLN: 100.0000 mL/h | INTRAVENOUS | Status: DC

## 2013-11-23 MED ORDER — SODIUM CHLORIDE 0.9 % IV SOLN: 250.0000 mL | INTRAVENOUS | Status: DC

## 2013-11-23 MED ORDER — SODIUM CHLORIDE 0.9 % IJ SOLN
3.0000 mL | Freq: Two times a day (BID) | INTRAMUSCULAR | Status: DC
  Administered 2013-11-23: 3 mL via INTRAVENOUS

## 2013-11-23 MED ORDER — POTASSIUM CHLORIDE CRYS ER 20 MEQ PO TBCR
40.0000 meq | EXTENDED_RELEASE_TABLET | Freq: Two times a day (BID) | ORAL | Status: DC
  Administered 2013-11-23 – 2013-11-24 (×2): 40 meq via ORAL
  Filled 2013-11-23 (×3): qty 2

## 2013-11-23 MED ORDER — SODIUM CHLORIDE 0.9 % IJ SOLN: 3.0000 mL | INTRAMUSCULAR | Status: DC | PRN

## 2013-11-23 MED ORDER — FUROSEMIDE 10 MG/ML IJ SOLN
INTRAMUSCULAR | Status: AC
  Filled 2013-11-23: qty 4

## 2013-11-23 NOTE — Progress Notes (Signed)
Noted plans for SNF rehab which seems appropriate for her level of participation with therapy. [phone]

## 2013-11-23 NOTE — Significant Event (Signed)
1525pm-patient has been transferred to 2W, room 29 safely via wheelchair. VS stable prior and during the transfer. All belongings taking with patient (clothes, a pair of shoes, and a black cellphone). Patient's family aware of the transfer and room number. Patient's spouse at bedside. Report given to receiving RN. Patient settled in chair, call-bell within reach; NT in room. Alyissa Whidbee, Therapist, sports.

## 2013-11-23 NOTE — Progress Notes (Signed)
Physical Therapy Treatment Patient Details Name: [name] MRN: [id number] DOB: 1940-11-27 Today's Date: 11/23/2013 Time: 1121-1200 PT Time Calculation (min): 39 min  PT Assessment / Plan / Recommendation  History of Present Illness Pt admit for NSTEMi, CHF and afib   PT Comments   Pt pleasant & agreeable to participate in therapy however she appears to be very self-limiting & requires max encouragement throughout.  Pt very quick to say "I can't do it".     Follow Up Recommendations  CIR;Supervision/Assistance - 24 hour     Does the patient have the potential to tolerate intense rehabilitation     Barriers to Discharge        Equipment Recommendations       Recommendations for Other Services    Frequency Min 3X/week   Progress towards PT Goals Progress towards PT goals: Progressing toward goals  Plan Current plan remains appropriate    Precautions / Restrictions Precautions Precautions: Fall Restrictions Weight Bearing Restrictions: No   Pertinent Vitals/Pain C/o Rt LE pain but did not rate.      Mobility  Transfers Overall transfer level: Needs assistance Equipment used: Rolling walker (2 wheeled) Transfers: Sit to/from [facility] Sit to Stand: Mod assist Stand pivot transfers: Min assist General transfer comment: cues for hand placement & technique.  Rocking motion to gain momentum to achieve standing.  Encouragement for her to perform as independently as possible-- pt quick to say "I can't do it" & requests for help.   Ambulation/Gait Ambulation/Gait assistance: Min guard;+2 safety/equipment (follow with recliner) Ambulation Distance (Feet): 30 Feet (10' + 20' ) Assistive device: Rolling walker (2 wheeled) Gait Pattern/deviations: Trunk flexed;Shuffle;Wide base of support Gait velocity: very decreased Gait velocity interpretation: Below normal speed for age/gender General Gait Details: Very slow, small shuffling steps.  cues for posture,  stay closer to RW for support, Max encouragement to increase distance.  Fatigues quickly but also very self-limiting      PT Goals (current goals can now be found in the care plan section) Acute Rehab PT Goals Patient Stated Goal: to go home PT Goal Formulation: With patient Time For Goal Achievement: 11/25/13 Potential to Achieve Goals: Good  Visit Information  Last PT Received On: 11/23/13 Assistance Needed: +1 History of Present Illness: Pt admit for NSTEMi, CHF and afib    Subjective Data  Patient Stated Goal: to go home   Cognition  Cognition Arousal/Alertness: Awake/alert Behavior During Therapy: WFL for tasks assessed/performed Overall Cognitive Status: Within Functional Limits for tasks assessed    Balance     End of Session PT - End of Session Activity Tolerance: Patient limited by fatigue;Patient tolerated treatment well Patient left: in chair;with call bell/phone within reach Nurse Communication: Mobility status   GP     Sena Hitch 11/23/2013, 2:51 PM   Sarajane Marek, PTA [phone] 11/23/2013

## 2013-11-23 NOTE — Progress Notes (Addendum)
CSW called pt's RN to get update concerning disposition. RN explains pt is still in Afib, on amiodarone drip, and they are monitoring heart. MD placed note in chart that pt will transfer to telemetry today; CSW updated pt's daughter and is following case. Upon discharge, pt to get rehab at [facility] or [facility] (first and second choice, in this order).  Addendum: CSW visted pt who states she is eager to get to rehab. CSW asked pt how she did walking the halls today, and pt states they pushed her but she is glad to work with PT because she wants to get better and get home. Pt states she did not get much sleep last night and that she is tired today. Pt thanked CSW for checking on her and helping with rehab placement. CSW updated pt's daughter on conversation with pt and pt's disposition. Pt states she is going to be moved to another unit soon. CSW continues to follow case.  Addendum: CSW received call from an admissions rep covering for the usual admissions rep at [facility] Admissions wondering when pt discharging, and CSW explained that it appears pt could discharge in the next day or two, and that CSW will update facility. SNF has pt's paperwork and is ready for her when pt is medically ready to leave the hospital.  Ky Barban, MSW, [place] Worker [phone]

## 2013-11-23 NOTE — Progress Notes (Signed)
Patient ID: [name], female   DOB: [birth year], 73 y.o.   MRN: [id number]    Subjective:  Depressed Slept in chair  Less dyspnic Back in afib   Objective:  Temp:  [97.2 F (36.2 C)-98.3 F (36.8 C)] 97.8 F (36.6 C) (01/19 0736) Pulse Rate:  [85-111] 110 (01/18 1715) Resp:  [21-30] 22 (01/19 0600) BP: (96-131)/(46-98) 96/46 mmHg (01/19 0736) SpO2:  [93 %-96 %] 95 % (01/19 0736) Weight:  [237 lb 14 oz (107.9 kg)] 237 lb 14 oz (107.9 kg) (01/19 0500) Weight change: -1 lb 1.6 oz (-0.5 kg)  Intake/Output from previous day: 01/18 0701 - 01/19 0700 In: 1303 [P.O.:840; I.V.:463] Out: 2400 [Urine:2400]  Intake/Output from this shift: Total I/O In: -  Out: 100 [Urine:100]  Medications: Current Facility-Administered Medications  Medication Dose Route Frequency Provider Last Rate Last Dose  . 0.9 %  sodium chloride infusion  250 mL Intravenous PRN Burnell Blanks, MD      . 0.9 %  sodium chloride infusion   Intravenous Continuous Josue Hector, MD 20 mL/hr at 11/18/13 1253 500 mL at 11/18/13 1253  . acetaminophen (TYLENOL) tablet 650 mg  650 mg Oral Q4H PRN Burnell Blanks, MD   650 mg at 11/19/13 2237  . ALPRAZolam Duanne Moron) tablet 0.25 mg  0.25 mg Oral BID PRN Josue Hector, MD   0.25 mg at 11/22/13 2240  . amiodarone (NEXTERONE PREMIX) 360 mg/200 mL dextrose IV infusion  30 mg/hr Intravenous Continuous Mihai Croitoru, MD 16.7 mL/hr at 11/23/13 0230 30 mg/hr at 11/23/13 0230  . apixaban (ELIQUIS) tablet 5 mg  5 mg Oral BID Georgina Peer, [facility]   5 mg at 11/22/13 2239  . aspirin EC tablet 81 mg  81 mg Oral Daily Burnell Blanks, MD   81 mg at 11/22/13 1100  . diltiazem (CARDIZEM) tablet 60 mg  60 mg Oral Q6H Josue Hector, MD   60 mg at 11/23/13 0527  . famotidine (PEPCID) tablet 20 mg  20 mg Oral Daily Burnell Blanks, MD   20 mg at 11/22/13 1100  . furosemide (LASIX) injection 80 mg  80 mg Intravenous BID Sanda Klein, MD   80 mg at 11/22/13  1901  . linagliptin (TRADJENTA) tablet 5 mg  5 mg Oral Daily Josue Hector, MD   5 mg at 11/22/13 1103  . meloxicam (MOBIC) tablet 7.5 mg  7.5 mg Oral BID Burnell Blanks, MD   7.5 mg at 11/22/13 2239  . metFORMIN (GLUCOPHAGE) tablet 1,000 mg  1,000 mg Oral BID WC Josue Hector, MD   1,000 mg at 11/22/13 1901  . mometasone-formoterol (DULERA) 200-5 MCG/ACT inhaler 2 puff  2 puff Inhalation BID Burnell Blanks, MD   2 puff at 11/22/13 1951  . montelukast (SINGULAIR) tablet 10 mg  10 mg Oral QHS Burnell Blanks, MD   10 mg at 11/22/13 2239  . nitroGLYCERIN (NITROSTAT) SL tablet 0.4 mg  0.4 mg Sublingual Q5 Min x 3 PRN Burnell Blanks, MD      . ondansetron [facility]) injection 4 mg  4 mg Intravenous Q6H PRN Burnell Blanks, MD   4 mg at 11/17/13 1500  . potassium chloride SA (K-DUR,KLOR-CON) CR tablet 20 mEq  20 mEq Oral BID Burnell Blanks, MD   20 mEq at 11/22/13 2240  . rosuvastatin (CRESTOR) tablet 20 mg  20 mg Oral Daily Josue Hector, MD   20 mg at  11/22/13 1100  . sodium chloride 0.9 % injection 3 mL  3 mL Intravenous Q12H Burnell Blanks, MD   3 mL at 11/21/13 2143  . sodium chloride 0.9 % injection 3 mL  3 mL Intravenous PRN Burnell Blanks, MD        Physical Exam: General appearance: alert, cooperative and no distress  Neck: no adenopathy, no carotid bruit, supple, symmetrical, trachea midline and thyroid not enlarged, symmetric, no tenderness/mass/nodules  Lungs: clear to auscultation bilaterally  Heart: regularly irregular rhythm, S1: normal, S2: decreased intensity, no S3 or S4 and systolic murmur: midpeaking 2-3/6.   Abdomen: soft, non-tender; bowel sounds normal; no masses, no organomegaly  Extremities: edema 2+ halfway up to knees  Pulses: 2+ and symmetric  Skin: Skin color, texture, turgor normal. No rashes or lesions  Neurologic: Grossly normal   Lab Results: Results for orders placed during the hospital encounter of  11/16/13 (from the past 48 hour(s))  GLUCOSE, CAPILLARY     Status: Abnormal   Collection Time    11/21/13 11:32 AM      Result Value Range   Glucose-Capillary 129 (*) 70 - 99 mg/dL  GLUCOSE, CAPILLARY     Status: Abnormal   Collection Time    11/21/13  5:12 PM      Result Value Range   Glucose-Capillary 132 (*) 70 - 99 mg/dL  GLUCOSE, CAPILLARY     Status: Abnormal   Collection Time    11/21/13  9:24 PM      Result Value Range   Glucose-Capillary 183 (*) 70 - 99 mg/dL  CBC     Status: Abnormal   Collection Time    11/22/13  3:50 AM      Result Value Range   WBC 10.6 (*) 4.0 - 10.5 K/uL   RBC 3.41 (*) 3.87 - 5.11 MIL/uL   Hemoglobin 9.3 (*) 12.0 - 15.0 g/dL   HCT 28.3 (*) 36.0 - 46.0 %   MCV 83.0  78.0 - 100.0 fL   MCH 27.3  26.0 - 34.0 pg   MCHC 32.9  30.0 - 36.0 g/dL   RDW 17.3 (*) 11.5 - 15.5 %   Platelets 255  150 - 400 K/uL  GLUCOSE, CAPILLARY     Status: Abnormal   Collection Time    11/22/13  8:09 AM      Result Value Range   Glucose-Capillary 173 (*) 70 - 99 mg/dL  GLUCOSE, CAPILLARY     Status: Abnormal   Collection Time    11/22/13 12:09 PM      Result Value Range   Glucose-Capillary 154 (*) 70 - 99 mg/dL  GLUCOSE, CAPILLARY     Status: Abnormal   Collection Time    11/22/13  5:16 PM      Result Value Range   Glucose-Capillary 188 (*) 70 - 99 mg/dL  GLUCOSE, CAPILLARY     Status: Abnormal   Collection Time    11/22/13  9:50 PM      Result Value Range   Glucose-Capillary 162 (*) 70 - 99 mg/dL  BASIC METABOLIC PANEL     Status: Abnormal   Collection Time    11/23/13  3:15 AM      Result Value Range   Sodium 135 (*) 137 - 147 mEq/L   Potassium 3.4 (*) 3.7 - 5.3 mEq/L   Chloride 94 (*) 96 - 112 mEq/L   CO2 23  19 - 32 mEq/L   Glucose, Bld 159 (*)  70 - 99 mg/dL   BUN 23  6 - 23 mg/dL   Creatinine, Ser 0.73  0.50 - 1.10 mg/dL   Calcium 8.5  8.4 - 10.5 mg/dL   GFR calc non Af Amer 84 (*) >90 mL/min   GFR calc Af Amer >90  >90 mL/min   Comment: (NOTE)      The eGFR has been calculated using the CKD EPI equation.     This calculation has not been validated in all clinical situations.     eGFR's persistently <90 mL/min signify possible Chronic Kidney     Disease.    Imaging: Imaging results have been reviewed and No results found.  Assessment:  Active Problems:   Atrial fibrillation with RVR   Acute diastolic CHF (congestive heart failure), NYHA class 4   NSTEMI (non-ST elevated myocardial infarction)   S/P aortic valve replacement with porcine valve 1. Aortic stenosis, severe  Plan: Afib:  Continue iv amiodarone  Repeat DCC  Before d/c  Continue eliquis AVR:  Plan is to go to rehab and get stronger  After 4 weeks anticoagulation post [facility] will have colonoscopy Fuller Plan) and cath Then see Dr Servando Snare for consideration of high risk redo AVR PT/OT:  Biggest issue is knee pain and inability to ambulate  Rehab at [facility] or [place]    Transfer to telemetry today  Length of Stay:  LOS: 7 days    Jenkins Rouge 11/23/2013, 7:40 AM

## 2013-11-23 NOTE — Progress Notes (Signed)
This RN called Dr Augustin Schooling office [phone], provided the on call attendant with call back number.  Call reason: pt K=3.4, bp 107/71, and has 80mg  of lasix ordered, calling to review order given the am lab results.  2235 rec'd return call from Dr Ardis Hughs, on call for Dr Cecile Sheerer, per Ardis Hughs, increase K to 56meQ and proceed with the 80mg  of Lasix.

## 2013-11-23 NOTE — Progress Notes (Signed)
Patient ID: [name], female   DOB: February 25, 1941, 73 y.o.   MRN: [id number] For rounding MD  Plan will be to try [facility] tomorrow at bedside Orders done Trish needs to schedule Sent her message Had TEE/DCC few days ago and has been on Eliquis since  Reverted to afib and on iv amiodarone 48 hrs Plan to try to restore NSR with severe AS and low EF D/C to rehab on Wednesday  [facility]

## 2013-11-24 ENCOUNTER — Encounter: Payer: Self-pay | Admitting: Gastroenterology

## 2013-11-24 DIAGNOSIS — E1129 Type 2 diabetes mellitus with other diabetic kidney complication: Secondary | ICD-10-CM

## 2013-11-24 LAB — GLUCOSE, CAPILLARY
GLUCOSE-CAPILLARY: 188 mg/dL — AB (ref 70–99)
Glucose-Capillary: 147 mg/dL — ABNORMAL HIGH (ref 70–99)

## 2013-11-24 MED ORDER — AMIODARONE HCL 200 MG PO TABS: 200.0000 mg | ORAL_TABLET | Freq: Every day | ORAL | Status: AC

## 2013-11-24 MED ORDER — APIXABAN 5 MG PO TABS: 5.0000 mg | ORAL_TABLET | Freq: Two times a day (BID) | ORAL | Status: AC

## 2013-11-24 MED ORDER — METOPROLOL SUCCINATE ER 50 MG PO TB24
50.0000 mg | ORAL_TABLET | Freq: Every day | ORAL | Status: DC
  Administered 2013-11-24: 50 mg via ORAL
  Filled 2013-11-24: qty 1

## 2013-11-24 MED ORDER — NITROGLYCERIN 0.4 MG SL SUBL: 0.4000 mg | SUBLINGUAL_TABLET | SUBLINGUAL | Status: AC | PRN

## 2013-11-24 MED ORDER — METOPROLOL SUCCINATE ER 50 MG PO TB24: 50.0000 mg | ORAL_TABLET | Freq: Every day | ORAL | Status: AC

## 2013-11-24 NOTE — Progress Notes (Signed)
Clinical Social Work Department CLINICAL SOCIAL WORK PLACEMENT NOTE 11/24/2013  Patient:  [name], [name]  Account Number:  [id number] Admit date:  11/16/2013  Clinical Social Worker:  Ky Barban, Latanya Presser  Date/time:  11/17/2013 10:46 AM  Clinical Social Work is seeking post-discharge placement for this patient at the following level of care:   [place]   (*CSW will update this form in Epic as items are completed)   11/17/2013  Patient/family provided with [facility] Department of Clinical Social Work's list of facilities offering this level of care within the geographic area requested by the patient (or if unable, by the patient's family).  11/17/2013  Patient/family informed of their freedom to choose among providers that offer the needed level of care, that participate in Medicare, Medicaid or managed care program needed by the patient, have an available bed and are willing to accept the patient.  11/17/2013  Patient/family informed of [facility]' ownership interest in [facility], as well as of the fact that they are under no obligation to receive care at this facility.  PASARR submitted to EDS on 11/17/2013 PASARR number received from EDS on 11/17/2013  FL2 transmitted to all facilities in geographic area requested by pt/family on  11/17/2013 FL2 transmitted to all facilities within larger geographic area on   Patient informed that his/her managed care company has contracts with or will negotiate with  certain facilities, including the following:     Patient/family informed of bed offers received:  11/23/2013 Patient chooses bed at [place] Physician recommends and patient chooses bed at    Patient to be transferred to [facility] [place] on  11/24/2013 Patient to be transferred to facility by EMS  The following physician request were entered in Epic:   Additional Comments:  Jeanette Caprice, MSW, [place]

## 2013-11-24 NOTE — Progress Notes (Signed)
Paged and spoke with Dr. Jules Husbands on-call for Dr. Johnsie Cancel regarding IV fluid order (1/2 NS @100mL /hr) in presence of pt's CHF and actively being diuresed. Order discontinued. Also made MD aware of pt's refusal of lasix secondary to her belief she had already received it despite it not being charted. No orders received regarding this. Will continue to monitor and assess.

## 2013-11-24 NOTE — Progress Notes (Signed)
CSW received call from pt's daughter [name], and CSW explained that pt moved off my unit and has new CSW now. I provided name and number for pt's new CSW, and [name] will call with any questions. Ann informed CSW that pt is discharging today, and this CSW has texted pt's unit CSW to alert her as well. Pt discharging to [facility], and CSW verified with the facility yesterday that they can accept her. This CSW signing off and unit CSW will facilitate discharge.   Ky Barban, MSW, [facility] Clinical Social Worker [phone]

## 2013-11-24 NOTE — Progress Notes (Signed)
    Subjective:  Sleeping, easily arousable. Quite pleasant. Does not appear to be in any distress, no significant shortness of breath.  Cardioverted on her own at 7:30 AM. On amiodarone IV.  Objective:  Vital Signs in the last 24 hours: Temp:  [97.3 F (36.3 C)-98 F (36.7 C)] 97.3 F (36.3 C) (01/20 0430) Pulse Rate:  [92] 92 (01/19 2004) Resp:  [20] 20 (01/19 2004) BP: (106-151)/(60-88) 116/60 mmHg (01/20 0430) SpO2:  [94 %-100 %] 98 % (01/20 0430) Weight:  [235 lb 14.3 oz (107 kg)] 235 lb 14.3 oz (107 kg) (01/20 0500)  Intake/Output from previous day: 01/19 0701 - 01/20 0700 In: 1070.7 [P.O.:720; I.V.:350.7] Out: 1150 [Urine:1150]   Physical Exam: General: Well developed, well nourished, in no acute distress. Head:  Normocephalic and atraumatic. Lungs: Clear to auscultation and percussion. Heart: Normal S1 and S2.  3/6 systolic ejection murmur,no rubs or gallops.  Abdomen: soft, non-tender, positive bowel sounds. Extremities: No clubbing or cyanosis. Trace edema. Neurologic: Alert and oriented x 3.    Lab Results:  Recent Labs  11/22/13 0350  WBC 10.6*  HGB 9.3*  PLT 255    Recent Labs  11/23/13 0315  NA 135*  K 3.4*  CL 94*  CO2 23  GLUCOSE 159*  BUN 23  CREATININE 0.73   Telemetry: Conversion from atrial fibrillation to sinus rhythm at 7:30 AM. Personally viewed.    Cardiac Studies:  TEE 11/18/13-EF 30%, severely calcified bioprosthetic aortic valve with severe aortic stenosis.  Assessment/Plan:  Active Problems:   Atrial fibrillation with RVR   Acute diastolic CHF (congestive heart failure), NYHA class 4   NSTEMI (non-ST elevated myocardial infarction)   S/P aortic valve replacement with porcine valve  1. Paroxysmal atrial fibrillation-successfully converted on IV amiodarone. I discussed with Dr. Johnsie Cancel, her primary cardiologist, and she will be discharged to [facility]. I will place her on by mouth amiodarone taper.  IV amiodarone was  started on 11/22/13. Will continue with amiodarone by mouth load, 400 mg twice a day for the next 3 days, 200 mg twice a day for 7 days then 200 mg once a day thereafter.  2. Severe aortic stenosis of bioprosthetic aortic valve-in anticipation of surgery, we will set up appointment with Dr. Lucio Edward for colonoscopy.  3. Cardiomyopathy-ejection fraction 30%, severe AS. High risk combination. Appears to be quite comfortable sitting without any dyspnea. NYHA 3. With her ejection fraction reduced, I will change her from diltiazem (negative inotrope) to metoprolol tartrate 50 mg once a day. May be able to up titrate further soon. Consider ACE inhibitor in future if able to tolerate.  4. Chronic anticoagulation-continue with Eliquis.     Narcissus Detwiler, [place] 11/24/2013, 8:39 AM

## 2013-11-24 NOTE — Progress Notes (Signed)
Clinical Social Worker facilitated patient discharge by contacting the patient, family and facility, [facility]. Patient and patient's daughter agreeable to this plan and arranging transport via EMS. CSW will sign off, as social work intervention is no longer needed.  Jeanette Caprice, MSW, [place]

## 2013-11-24 NOTE — Discharge Summary (Signed)
Physician Discharge Summary  Patient ID: [name] MRN: [id number] DOB/AGE: 72-Dec-1942 72 y.o.  Admit date: 11/16/2013 Discharge date: 11/24/2013  Admission Diagnoses:  Atrial fibrillation with RVR, NSTEMI,  Acute combined systolic and diastolic congestive heart failure  Discharge Diagnoses:  Active Problems:   Atrial fibrillation with RVR   Acute combined systolic and diastolic congestive heart failure   NSTEMI (non-ST elevated myocardial infarction)   S/P aortic valve replacement with porcine valve   Discharged Condition: stable  Hospital Course:  73 yo female with history of aortic valve replacement 6010, HTN, diastolic CHF, HLD, obesity, DM, GERD presented to ED at [facility] from her primary care office with c/o SOB, found to be in atrial fibrillation with RVR (HR 105-120). She has not had atrial fibrillation documented prior to today. She is followed by Dr. Johnsie Cancel. Bioprosthetic AVR in January 2011. She first noticed SOB yesterday. No chest pain or awareness of palpitations, irregularity of her heart rhythm. EKG does not show ischemic changes. First troponin is mildly elevated. BNP is elevated at 13,472. She has been given IV Lasix and IV Cardizem with better control of HR and symptomatic improvement. She currently has no complaints.   The patient was placed on IV diltiazem at 10mg /hr.  Heparin was added. Troponin was elevated(peak 2.92) and was thought to to be demand ischemia.  She was also treat for acute combined sys/ diastolic CHF with IV lasix.  Net fluids were 3.3L.   She was changed to PO at discharge.  2D echo revealed an EF of 30-35%, Diffuse hypokinesis mild LVH.  Previous EF was 55-60% on 05/27/12. TEE was subsequently completed, showed no thrombus and confirmed reduced EF.  She had a reoccurrence of afib RVR and was started on IV amiodarone.  She is on Eliquis.  This was changed to PO at discharge and will be tapered down.  She did convert to NSR.   She will be scheduled  to see Dr. Fuller Plan [facility] for preop colonoscopy for consideration of high risk redo AVR root replacement and poss mitral repair.  Dr. Servando Snare consulted.   The patient was seen by Dr. Marlou Porch who felt she was stable for DC to [facility].    Consults: CTS, P  Significant Diagnostic Studies: Echo Study Conclusions  - Left ventricle: The cavity size was mildly dilated. Wall thickness was increased in a pattern of mild LVH. Systolic function was moderately to severely reduced. The estimated ejection fraction was in the range of 30% to 35%. Diffuse hypokinesis. Doppler parameters are consistent with high ventricular filling pressure. - Aortic valve: A bioprosthesis was present. - Mitral valve: Calcified annulus. Mildly thickened leaflets . Mild regurgitation. - Left atrium: The atrium was moderately dilated. - Pulmonary arteries: PA peak pressure: 49mm Hg (S). Impressions:  - Technically difficult; definity used; prosthetic aortic valve with elevated gradient suggesting severe AS; compared to 05/27/12, LV function is worse and mean gradient across aortic valve has decreased (most likely related to poor LV function); consider TEE to better assess if clinically indicated.  TEE Study Conclusions  - Left ventricle: The cavity size was normal. There was mild concentric hypertrophy. Systolic function was moderately to severely reduced. The estimated ejection fraction was in the range of 30% to 35%. Diffuse hypokinesis. - Aortic valve: Bioprosthetic aortic valve. The valve was heavily calcified with some restriction. The valve also appeared to be small. There was evidence for severe biorposthetic valve stenosis with mean gradient 68 mmHg (averaged over 5 cycles). Trivial  regurgitation. Mean gradient:85mm Hg (S). - Aorta: Normal caliber aorta with mild plaque in the descending thoracic aorta. - Mitral valve: The posterior mitral valve leaflet was calcified and restricted. Moderate  regurgitation. Effective regurgitant orifice: 0.17cm^2 (PISA). Regurgitant volume: 41ml (PISA). - Left atrium: The atrium was mildly to moderately dilated. No evidence of thrombus in the atrial cavity or appendage. - Pulmonary veins: Systolic flattening but not reversal in the pulmonary vein doppler pattern. - Right ventricle: The cavity size was normal. Systolic function was mildly reduced. - Right atrium: No evidence of thrombus in the atrial cavity or appendage. - Atrial septum: No defect or patent foramen ovale was identified. Echo contrast study showed no right-to-left atrial level shunt, at baseline or with provocation. - Tricuspid valve: Peak RV-RA gradient: 30mm Hg (S). Impressions:  - The patient was in rapid atrial fibrillation.   Treatments: See above  Discharge Exam: Blood pressure 116/60, pulse 92, temperature 97.3 F (36.3 C), temperature source Oral, resp. rate 16, height 5' 4.17" (1.63 m), weight 235 lb 14.3 oz (107 kg), SpO2 98.00%.   Disposition: 01-Home or Self Care      Discharge Orders   Future Appointments Provider Department Dept Phone   12/14/2013 11:00 AM Ladene Artist, MD [facility] [phone]   12/22/2013 3:30 PM Josue Hector, MD [facility] [phone]   Future Orders Complete By Expires   Diet - low sodium heart healthy  As directed    Increase activity slowly  As directed        Medication List    STOP taking these medications       losartan 50 MG tablet  Commonly known as:  COZAAR      TAKE these medications       amiodarone 200 MG tablet  Commonly known as:  PACERONE  Take 1 tablet (200 mg total) by mouth daily.     apixaban 5 MG Tabs tablet  Commonly known as:  ELIQUIS  Take 1 tablet (5 mg total) by mouth 2 (two) times daily.     aspirin 81 MG tablet  Take 81 mg by mouth daily.     Calcium-Magnesium 500-250 MG Tabs  Take 1 tablet by mouth daily.     Chromium 200 MCG Caps   Take 1 each by mouth daily.     DULERA 200-5 MCG/ACT Aero  Generic drug:  mometasone-formoterol  Inhale 2 Inhalers into the lungs 2 (two) times daily as needed.     furosemide 40 MG tablet  Commonly known as:  LASIX  Take 1 tablet (40 mg total) by mouth 2 (two) times daily.     meloxicam 7.5 MG tablet  Commonly known as:  MOBIC  Take 7.5 mg by mouth 2 (two) times daily.     metoprolol succinate 50 MG 24 hr tablet  Commonly known as:  TOPROL-XL  Take 1 tablet (50 mg total) by mouth daily. Take with or immediately following a meal.     montelukast 10 MG tablet  Commonly known as:  SINGULAIR  Take 1 tablet (10 mg total) by mouth at bedtime.     multivitamin tablet  Take 1 tablet by mouth daily.     nitroGLYCERIN 0.4 MG SL tablet  Commonly known as:  NITROSTAT  Place 1 tablet (0.4 mg total) under the tongue every 5 (five) minutes x 3 doses as needed for chest pain.     potassium chloride SA 20 MEQ tablet  Commonly known  as:  K-DUR,KLOR-CON  Take 1 tablet (20 mEq total) by mouth 2 (two) times daily.     ranitidine 150 MG tablet  Commonly known as:  ZANTAC  Take 150 mg by mouth 2 (two) times daily.     rosuvastatin 20 MG tablet  Commonly known as:  CRESTOR  Take 20 mg by mouth daily.     sitaGLIPtin-metformin 50-1000 MG per tablet  Commonly known as:  JANUMET  Take 1 tablet by mouth 2 (two) times daily with a meal.     vitamin E 400 UNIT capsule  Take 400 Units by mouth daily.       Follow-up Information   Follow up with Jenkins Rouge, MD On 12/22/2013. (3:30PM)    Specialty:  Cardiology   Contact information:   [address] [place] [address] [phone]       Follow up with Norberto Sorenson T. Fuller Plan, MD On 12/14/2013. (11:00 AM)    Specialty:  Gastroenterology   Contact information:   [address] [place] [address] [phone]       Signed: Tarri Fuller 11/24/2013, 10:03 AM  Patient seen and examined personally, agree with above.  Total discharge time 35 minutes spent with patient, education, review lab work.  Aortic stenosis of bioprosthetic aortic valve. Awaiting redo. Needs colonoscopy. Converted back to sinus rhythm from atrial fibrillation. Continue with amiodarone by mouth. I changed diltiazem to metoprolol because of cardiomyopathy.  Candee Furbish, MD

## 2013-11-24 NOTE — Progress Notes (Signed)
Paged Dr. Jules Husbands regarding pt returning to NSR with PACs (HR 70s) @0404 . No return call. No orders received. Pt stable and resting. Will convey change to oncoming RN.

## 2013-11-25 ENCOUNTER — Non-Acute Institutional Stay (SKILLED_NURSING_FACILITY): Payer: Medicare Other | Admitting: Internal Medicine

## 2013-11-25 DIAGNOSIS — E1059 Type 1 diabetes mellitus with other circulatory complications: Secondary | ICD-10-CM

## 2013-11-25 DIAGNOSIS — E1051 Type 1 diabetes mellitus with diabetic peripheral angiopathy without gangrene: Secondary | ICD-10-CM | POA: Insufficient documentation

## 2013-11-25 DIAGNOSIS — I4891 Unspecified atrial fibrillation: Secondary | ICD-10-CM | POA: Insufficient documentation

## 2013-11-25 DIAGNOSIS — I509 Heart failure, unspecified: Secondary | ICD-10-CM

## 2013-11-25 DIAGNOSIS — I2510 Atherosclerotic heart disease of native coronary artery without angina pectoris: Secondary | ICD-10-CM | POA: Insufficient documentation

## 2013-11-25 NOTE — Progress Notes (Signed)
HISTORY & PHYSICAL  DATE: 11/25/2013   FACILITY: [facility]  LEVEL OF CARE: SNF (31)  ALLERGIES:  Allergies  Allergen Reactions  . Cephalexin Diarrhea    Also stomachache  . Tramadol Nausea And Vomiting    "tore her stomach up"  . Septra [Bactrim]     Yeast infection  . Septra [Sulfamethoxazole-Tmp Ds]     "Gave me a yeast infection"    CHIEF COMPLAINT:  Manage atrial fibrillation, CHF and CAD  HISTORY OF PRESENT ILLNESS: Patient is a 73 year old female was hospitalized secondary to A. fib with RVR. After hospitalization she is admitted to this facility for short-term rehabilitation.  ATRIAL FIBRILLATION: the patients atrial fibrillation remains stable.  The patient denies DOE, tachycardia, orthopnea, transient neurological sx, palpitations, & PNDs.  No complications noted from the medications currently being used. Complains of lower extremity swelling.  CHF:The patient does not relate significant weight changes, denies sob, DOE, orthopnea, PNDs, palpitations or chest pain.  CHF remains stable.  No complications form the medications being used. EF 30-35%  CAD: The angina has been stable. The patient denies dyspnea on exertion, orthopnea, palpitations and paroxysmal nocturnal dyspnea. No complications noted from the medication presently being used.  PAST MEDICAL HISTORY :  Past Medical History  Diagnosis Date  . Adenomatous polyps   . Hemorrhoids, external   . Constipation, chronic   . Hypertension   . CHF (congestive heart failure)   . Aortic stenosis, moderate   . Hyperlipidemia   . Carotid bruit   . Osteoarthritis   . Edema   . Hemoptysis   . Cancer     BREAST-HX OF  . Dysmetabolic syndrome X   . Low back pain syndrome   . Diabetes mellitus     TYPE 2  . Anemia   . Obesity   . Radiation   . GERD (gastroesophageal reflux disease)     hiatal hernia  . Dysrhythmia     afib    PAST SURGICAL HISTORY: Past Surgical History    Procedure Laterality Date  . Colonoscopy      AUGUST 2009  . Abdominal hysterectomy    . Breast lumpectomy      2009  . Tubal ligation    . Polypectomy      2004  . Replacement total knee      BILATERAL  . Aortic valve replacement    . Breast recision      LEFT BREAST RECISION OF INFERIOR MARGIN  . Tee without cardioversion N/A 11/18/2013    Procedure: TRANSESOPHAGEAL ECHOCARDIOGRAM (TEE);  Surgeon: Larey Dresser, MD;  Location: [place];  Service: Cardiovascular;  Laterality: N/A;  . Cardioversion N/A 11/18/2013    Procedure: CARDIOVERSION;  Surgeon: Larey Dresser, MD;  Location: [place];  Service: Cardiovascular;  Laterality: N/A;    SOCIAL HISTORY:  reports that she quit smoking about 41 years ago. She does not have any smokeless tobacco history on file. She reports that she does not drink alcohol or use illicit drugs.  FAMILY HISTORY:  Family History  Problem Relation Age of Onset  . Stroke Mother   . Aneurysm Mother   . Kidney disease Father   . Diabetes Sister   . Diabetes Brother   . Heart disease Brother   . Diabetes Sister   . Diabetes Brother   . Colon cancer Other   . Colon polyps Other     CURRENT MEDICATIONS: Reviewed  per [facility]  REVIEW OF SYSTEMS:  See HPI otherwise 14 point ROS is negative.  PHYSICAL EXAMINATION  VS:  T 97.8      P 94       RR 20       BP 111/68       POX% 94       WT (Lb) 235  GENERAL: no acute distress, morbidly obese body habitus EYES: conjunctivae normal, sclerae normal, normal eye lids MOUTH/THROAT: lips without lesions,no lesions in the mouth,tongue is without lesions,uvula elevates in midline NECK: supple, trachea midline, no neck masses, no thyroid tenderness, no thyromegaly LYMPHATICS: no LAN in the neck, no supraclavicular LAN RESPIRATORY: breathing is even & unlabored, BS CTAB CARDIAC: Heart rate is irregularly irregular, no murmur,no extra heart sounds, +3 bilateral lower extremity edema GI:  ABDOMEN: abdomen  soft, normal BS, no masses, no tenderness  LIVER/SPLEEN: no hepatomegaly, no splenomegaly MUSCULOSKELETAL: HEAD: normal to inspection & palpation BACK: no kyphosis, scoliosis or spinal processes tenderness EXTREMITIES: LEFT UPPER EXTREMITY: Moderate range of motion, normal strength & tone RIGHT UPPER EXTREMITY:  Moderate range of motion, normal strength & tone LEFT LOWER EXTREMITY:  range of motion unable to assess, decreased strength & tone RIGHT LOWER EXTREMITY:  range of motion unable to assess, decreased strength & tone PSYCHIATRIC: the patient is alert & oriented to person, affect & behavior appropriate  LABS/RADIOLOGY:  Labs reviewed: Basic Metabolic Panel:  Recent Labs  11/16/13 1530 11/17/13 0325 11/23/13 0315  NA 134* 133* 135*  K 4.0 3.6* 3.4*  CL 92* 94* 94*  CO2 24 25 23   GLUCOSE 130* 117* 159*  BUN 21 18 23   CREATININE 0.79 0.67 0.73  CALCIUM 9.1 8.7 8.5   Liver Function Tests:  Recent Labs  06/02/13 0911  AST 19  ALT 15  ALKPHOS 77  BILITOT 0.5  PROT 7.4  ALBUMIN 4.0   CBC:  Recent Labs  06/02/13 0911 10/08/13 1508 11/16/13 1530 11/17/13 0325 11/18/13 0210 11/22/13 0350  WBC 8.2 5.9 14.1* 11.3* 15.0* 10.6*  NEUTROABS 5.5 4.4 12.0*  --   --   --   HGB 11.3* 10.9* 9.2* 8.0* 8.9* 9.3*  HCT 34.5* 32.8* 28.5* 24.7* 26.7* 28.3*  MCV 86.8 85.7 83.6 83.2 82.7 83.0  PLT 215.0 169.0 302 225 237 255   Lipid Panel:  Recent Labs  06/02/13 0911  HDL 46.90   Cardiac Enzymes:  Recent Labs  11/16/13 1530 11/17/13 0325 11/17/13 0743  TROPONINI 2.92* 1.87* 1.50*   CBG:  Recent Labs  11/23/13 2145 11/24/13 0636 11/24/13 1121  GLUCAP 136* 147* 188*   Transthoracic Echocardiography  Patient:    [name], [name] MR #:       [id number] Study Date: 11/17/2013 Gender:     F Age:        57 Height:     162.6cm Weight:     108.2kg BSA:        2.40m^2 Pt. Status: Room:       2H16C    ADMITTING    Jenkins Rouge  ATTENDING    Jenkins Rouge  SONOGRAPHER  Tresa Res, RDCS  ORDERING     Lauree Chandler  REFERRING    McAlhany, Christopher  PERFORMING   [facility], Inpatient cc:  ------------------------------------------------------------ LV EF: 30% -   35%  ------------------------------------------------------------ Indications:      Atrial fibrillation - 427.31.  ------------------------------------------------------------ History:   PMH:   Atrial fibrillation.  Congestive heart failure.  Risk factors:  Diabetes mellitus.  ------------------------------------------------------------ Study Conclusions  - Left ventricle: The cavity size was mildly dilated. Wall   thickness was increased in a pattern of mild LVH. Systolic   function was moderately to severely reduced. The estimated   ejection fraction was in the range of 30% to 35%. Diffuse   hypokinesis. Doppler parameters are consistent with high   ventricular filling pressure. - Aortic valve: A bioprosthesis was present. - Mitral valve: Calcified annulus. Mildly thickened leaflets   . Mild regurgitation. - Left atrium: The atrium was moderately dilated. - Pulmonary arteries: PA peak pressure: 38mm Hg (S). Impressions:  - Technically difficult; definity used; prosthetic aortic   valve with elevated gradient suggesting severe AS;   compared to 05/27/12, LV function is worse and mean   gradient across aortic valve has decreased (most likely   related to poor LV function); consider TEE to better   assess if clinically indicated.  ------------------------------------------------------------ Labs, prior tests, procedures, and surgery: Aortic valve replacement.  Transthoracic echocardiography.  M-mode, complete 2D, spectral Doppler, and color Doppler.  Height:  Height: 162.6cm. Height: 64in.  Weight:  Weight: 108.2kg. Weight: 238lb.  Body mass index:  BMI: 40.9kg/m^2.  Body surface area:    BSA: 2.73m^2.  Blood pressure:     77 (systolic). Patient status:   Inpatient.  Location:  ICU/CCU  ------------------------------------------------------------  ------------------------------------------------------------ Left ventricle:  The cavity size was mildly dilated. Wall thickness was increased in a pattern of mild LVH. Systolic function was moderately to severely reduced. The estimated ejection fraction was in the range of 30% to 35%. Diffuse hypokinesis. The study was not technically sufficient to allow evaluation of LV diastolic dysfunction due to atrial fibrillation. Doppler parameters are consistent with high ventricular filling pressure.  ------------------------------------------------------------ Aortic valve:  A bioprosthesis was present.  Doppler:   No regurgitation.    Peak velocity ratio of LVOT to aortic valve: 0.3.    Mean gradient: 14mm Hg (S). Peak gradient: 77mm Hg (S).  ------------------------------------------------------------ Aorta:  Aortic root: The aortic root was normal in size.  ------------------------------------------------------------ Mitral valve:   Calcified annulus. Mildly thickened leaflets . Mobility was not restricted.  Doppler:  Transvalvular velocity was within the normal range. There was no evidence for stenosis.  Mild regurgitation.    Valve area by pressure half-time: 3.55cm^2. Indexed valve area by pressure half-time: 1.57cm^2/m^2.    Mean gradient: 41mm Hg (D). Peak gradient: 47mm Hg (D).  ------------------------------------------------------------ Left atrium:  The atrium was moderately dilated.  ------------------------------------------------------------ Right ventricle:  The cavity size was normal. Systolic function was normal.  ------------------------------------------------------------ Pulmonic valve:    Doppler:  Transvalvular velocity was within the normal range. There was no evidence for stenosis.   ------------------------------------------------------------ Tricuspid valve:    Structurally normal valve.    Doppler: Transvalvular velocity was within the normal range.  Trivial regurgitation.  ------------------------------------------------------------ Pulmonary artery:   Systolic pressure was within the normal range.  ------------------------------------------------------------ Right atrium:  The atrium was normal in size.  ------------------------------------------------------------ Pericardium:  There was no pericardial effusion.  ------------------------------------------------------------ Systemic veins: Inferior vena cava: The vessel was normal in size.  ------------------------------------------------------------  2D measurements        Normal  Doppler measurements   Normal Left ventricle                 Main pulmonary LVID ED,   53.3 mm     43-52   artery chord,  Pressure,    32 mm Hg  =30 PLAX                           S LVID ES,   44.8 mm     23-38   Left ventricle chord,                         Ea, lat    9.29 cm/s   ------ PLAX                           ann, tiss FS, chord,   16 %      >29     DP PLAX                           E/Ea, lat  18.5        ------ LVPW, ED   12.3 mm     ------  ann, tiss     1 IVS/LVPW   1.12        <1.3    DP ratio, ED                      Ea, med    6.85 cm/s   ------ Ventricular septum             ann, tiss IVS, ED    13.8 mm     ------  DP Aorta                          E/Ea, med  25.1        ------ Root diam,   28 mm     ------  ann, tiss     1 ED                             DP Left atrium                    LVOT AP dim       58 mm     ------  Peak vel,   113 cm/s   ------ AP dim     2.56 cm/m^2 <2.2    S index                          Peak          5 mm Hg  ------                                gradient,                                S                                Aortic valve                                Peak vel,   378 cm/s   ------  S                                 Mean vel,   299 cm/s   ------                                S                                VTI, S     78.8 cm     ------                                Mean         38 mm Hg  ------                                gradient,                                S                                Peak         57 mm Hg  ------                                gradient,                                S                                Peak vel    0.3        ------                                ratio,                                LVOT/AV                                Mitral valve                                Peak E vel  172 cm/s   ------                                Mean vel,  84.6 cm/s   ------                                D  Decelerati  162 ms     150-23                                on time                0                                Pressure     62 ms     ------                                half-time                                Mean          4 mm Hg  ------                                gradient,                                D                                Peak         12 mm Hg  ------                                gradient,                                D                                Area (PHT) 3.55 cm^2   ------                                Area index 1.57 cm^2/m ------                                (PHT)           ^2                                Annulus      36 cm     ------                                VTI                                Tricuspid valve  Regurg      232 cm/s   ------                                peak vel                                Peak RV-RA   22 mm Hg  ------                                gradient,                                S                                Max regurg  232 cm/s   ------                                vel                                 Systemic veins                                Estimated    10 mm Hg  ------                                CVP                                Right ventricle                                Pressure,    32 mm Hg  <30                                S   ------------------------------------------------------------ Prepared and Electronically Authenticated by  Transesophageal Echocardiography  Patient:    [name], [name] MR #:       [id number] Study Date: 11/18/2013 Gender:     F Age:        58 Height:     162.6cm Weight:     109.1kg BSA:        2.34m^2 Pt. Status: Room:       2H16C    ADMITTING    Jenkins Rouge  ATTENDING    Lucille Passy  REFERRING    Jenkins Rouge  PERFORMING   Loralie Champagne  SONOGRAPHER  Tresa Res, RDCS cc:  ------------------------------------------------------------ LV EF: 30% -   35%  ------------------------------------------------------------ Indications:      Atrial fibrillation - 427.31.  ------------------------------------------------------------ Study Conclusions  - Left ventricle: The cavity size was normal. There was mild   concentric hypertrophy. Systolic function was moderately   to severely  reduced. The estimated ejection fraction was   in the range of 30% to 35%. Diffuse hypokinesis. - Aortic valve: Bioprosthetic aortic valve. The valve was   heavily calcified with some restriction. The valve also   appeared to be small. There was evidence for severe   biorposthetic valve stenosis with mean gradient 68 mmHg   (averaged over 5 cycles). Trivial regurgitation. Mean   gradient:18mm Hg (S). - Aorta: Normal caliber aorta with mild plaque in the   descending thoracic aorta. - Mitral valve: The posterior mitral valve leaflet was   calcified and restricted. Moderate regurgitation.   Effective regurgitant orifice: 0.17cm^2 (PISA).   Regurgitant volume: 64ml (PISA). - Left atrium: The atrium was mildly  to moderately dilated.   No evidence of thrombus in the atrial cavity or appendage. - Pulmonary veins: Systolic flattening but not reversal in   the pulmonary vein doppler pattern. - Right ventricle: The cavity size was normal. Systolic   function was mildly reduced. - Right atrium: No evidence of thrombus in the atrial cavity   or appendage. - Atrial septum: No defect or patent foramen ovale was   identified. Echo contrast study showed no right-to-left   atrial level shunt, at baseline or with provocation. - Tricuspid valve: Peak RV-RA gradient: 47mm Hg (S). Impressions:  - The patient was in rapid atrial fibrillation. Transesophageal echocardiography.  2D and color Doppler. Height:  Height: 162.6cm. Height: 64in.  Weight:  Weight: 109.1kg. Weight: 240lb.  Body mass index:  BMI: 41.3kg/m^2. Body surface area:    BSA: 2.93m^2.  Blood pressure: 100/57.  Patient status:  Inpatient.  Location:  Endoscopy.   ------------------------------------------------------------  ------------------------------------------------------------ Left ventricle:  The cavity size was normal. There was mild concentric hypertrophy. Systolic function was moderately to severely reduced. The estimated ejection fraction was in the range of 30% to 35%. Diffuse hypokinesis.  ------------------------------------------------------------ Aortic valve:  Bioprosthetic aortic valve. The valve was heavily calcified with some restriction. The valve also appeared to be small. There was evidence for severe biorposthetic valve stenosis with mean gradient 68 mmHg (averaged over 5 cycles).  Doppler:   Trivial regurgitation.    Mean gradient:36mm Hg (S).  ------------------------------------------------------------ Aorta:  Normal caliber aorta with mild plaque in the descending thoracic aorta.  ------------------------------------------------------------ Mitral valve:  The posterior mitral valve leaflet was calcified  and restricted.  Doppler:   There was no evidence for stenosis.    Moderate regurgitation.  ------------------------------------------------------------ Left atrium:  The atrium was mildly to moderately dilated. No evidence of thrombus in the atrial cavity or appendage.   ------------------------------------------------------------ Atrial septum:  No defect or patent foramen ovale was identified.  Echo contrast study showed no right-to-left atrial level shunt, at baseline or with provocation.  ------------------------------------------------------------ Pulmonary veins:  Systolic flattening but not reversal in the pulmonary vein doppler pattern.  ------------------------------------------------------------ Right ventricle:  The cavity size was normal. Systolic function was mildly reduced.  ------------------------------------------------------------ Pulmonic valve:    Structurally normal valve.   Cusp separation was normal.  ------------------------------------------------------------ Tricuspid valve:   Doppler:   Mild regurgitation.  ------------------------------------------------------------ Right atrium:  The atrium was normal in size.  No evidence of thrombus in the atrial cavity or appendage.  ------------------------------------------------------------ Pericardium:  There was no pericardial effusion.   ------------------------------------------------------------ Post procedure conclusions Ascending Aorta:  - Normal caliber aorta with mild plaque in the descending   thoracic aorta.  ------------------------------------------------------------  Doppler measurements   Normal Aortic valve Mean  47 mm   ------ gradient, S       Hg Mitral valve Regurg alias 44.5 cm/s ------ vel, PISA Max regurg    598 cm/s ------ vel Regurg VTI    176 cm   ------ ERO, PISA    0.17 cm^2 ------ Regurg vol,    30 ml   ------ PISA Tricuspid valve Regurg peak   328 cm/s  ------ vel Peak RV-RA     43 mm   ------ gradient, S       Hg Max regurg    328 cm/s ------ vel   ------------------------------------------------------------ Prepared and Electronically Authenticated by  Marca Ancona [id number]:[id number]     Wall Scoring      2D Measurements    Dimensions                              LVIDD (cm)     LVIDS (cm)      IVS (cm)     LV PW (cm)      FS (%)     EF (%)      LA size (cm)     LA volume (cm3)      LV mass (g)     LV volume (cm3)                                           Aortic Root Measurements - End Diastolic                             Annulus (cm)     Sinus (cm)      STJ (cm)     Ao-prox (cm)      Ao-asc (cm)     Ao-arch (cm)                                           Main Pulmonary Measurements - End Diastolic                                 Annulus (cm)     MPA (cm)      LPA (cm)     RPA (cm)                                           Inferior Vena Cava                               Ostium (cm)     Proximal (cm)             Doppler Measurements    Aortic Valve                              Stenosis  Value  Regurgitation  Value   LVOT diam (cm)     SV 1 (cm3)      LVOT area (cm2)     SV 2 (cm3)  LVOT pk vel (m/s)     Reg frac (%)      LVOT VTI (cm)     P 1/2 time (m/s)      Ao pk vel (m/s)     Vena cont (cm)      Ao VTI (cm)         Valve area (cm2)     PISA   Mean grad (mmHg)     Vnyquist (m/s)      Peak grad (mmHg)     Radius (cm)      Index-nat (V1/V2)     MR max vel (m/s)      Index-pros (V1/V2)     Area-PISA (cm2)             HCM    LVOT grad (mmHg)       Grad Vals (mmHg)       Grad Amyl (mmHg)       Grad exer (mmHg)                                            Mitral Valve                   Stenosis  Value  Regurgitation  Value   Mean grad (mmHg)     SV 1 (cm3)      Peak grad (mmHg)     SV 2 (cm3)      Area-PISA (cm2)     Reg frac (%)      Area-2D (cm2)     P 1/2 time (m/s)      Area-P 1/2 (cm2)      Vena cont (cm)      Area-cont eq (cm2)             Stress Evaluation  PISA   Exer grad (mmHg)     Vnyquist (m/s)      Exer area (mmHg)     Radius (cm)      Exer PAP (mmHg)     MR max vel (m/s)         Area-PISA (cm2)        Tricuspid Valve Stenosis  Value  Regurgitation  Value   RVOT diam (cm)     SV 1 (cm3)      RVOT area (cm2)     SV 2 (cm3)      RVOT pk vel (m/s)     Reg frac (%)      RVOT VTI (cm)     Vena cont (cm)      TV VTI (cm)         Mean grad (mmHg)       Peak grad (mmHg)       Valve area (cm2)           Stress Evaluation  PISA   Rest PAP (mmHg)     Vnyquist (m/s)      Peak PAP (mmHg)     Radius (cm)         MR max vel (m/s)       Area-PISA (cm2)       Pulmonic Valve Stenosis  Value  Regurgitation  Value   Mean grad (mmHg)     SV 1 (cm3)      Peak grad (mmHg)     SV 2 (cm3)  Valve area (cm2)     Reg frac (%)       Diastolic Filing E/A ratio     E decel time (msec)      IVRT (msec)     MV"A" dur (msec)      Pulm vein S/D ratio     Pulm AR dur (msec)      TDI     E/e' ratio       Shunt Ratio LVOT SV (cm3)     RVOT SV (cm3)      Qp:Qs ratio            CHEST - 1 VIEW   COMPARISON:  One-view chest 11/07/2011.   FINDINGS: The heart is enlarged. The patient is status post median sternotomy for CABG. Atherosclerotic calcifications are noted in the aorta. Aeration is improved. Mild pulmonary vascular congestion persist. Linear densities bilaterally suggest atelectasis.   IMPRESSION: 1. Cardiomegaly with persistent pulmonary vascular congestion and congestive heart failure. 2. Overall improved aeration. 3. Areas of linear densities bilaterally suggest residual atelectasis.   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 Recheck. Check CBC with differential and  BMP  I have reviewed patient's medical records received at admission/from hospitalization.  CPT CODE: 03474

## 2013-11-27 ENCOUNTER — Encounter: Payer: Self-pay | Admitting: Gastroenterology

## 2013-11-28 ENCOUNTER — Other Ambulatory Visit: Payer: Self-pay

## 2013-11-28 LAB — BASIC METABOLIC PANEL
ANION GAP: 11 (ref 7–16)
BUN: 45 mg/dL — AB (ref 7–18)
CALCIUM: 8.5 mg/dL (ref 8.5–10.1)
CREATININE: 1.31 mg/dL — AB (ref 0.60–1.30)
Chloride: 97 mmol/L — ABNORMAL LOW (ref 98–107)
Co2: 21 mmol/L (ref 21–32)
EGFR (Non-African Amer.): 41 — ABNORMAL LOW
GFR CALC AF AMER: 47 — AB
Glucose: 176 mg/dL — ABNORMAL HIGH (ref 65–99)
OSMOLALITY: 275 (ref 275–301)
Potassium: 5 mmol/L (ref 3.5–5.1)
SODIUM: 129 mmol/L — AB (ref 136–145)

## 2013-11-29 ENCOUNTER — Other Ambulatory Visit: Payer: Self-pay

## 2013-11-29 LAB — BASIC METABOLIC PANEL
ANION GAP: 7 (ref 7–16)
BUN: 42 mg/dL — ABNORMAL HIGH (ref 7–18)
CALCIUM: 8.9 mg/dL (ref 8.5–10.1)
CHLORIDE: 97 mmol/L — AB (ref 98–107)
Co2: 25 mmol/L (ref 21–32)
Creatinine: 1.29 mg/dL (ref 0.60–1.30)
EGFR (African American): 48 — ABNORMAL LOW
EGFR (Non-African Amer.): 42 — ABNORMAL LOW
Glucose: 189 mg/dL — ABNORMAL HIGH (ref 65–99)
Osmolality: 274 (ref 275–301)
Potassium: 4.8 mmol/L (ref 3.5–5.1)
SODIUM: 129 mmol/L — AB (ref 136–145)

## 2013-12-06 ENCOUNTER — Emergency Department (HOSPITAL_COMMUNITY): Payer: Medicare Other

## 2013-12-06 ENCOUNTER — Encounter (HOSPITAL_COMMUNITY): Payer: Self-pay | Admitting: Emergency Medicine

## 2013-12-06 ENCOUNTER — Inpatient Hospital Stay (HOSPITAL_COMMUNITY): Payer: Medicare Other

## 2013-12-06 ENCOUNTER — Inpatient Hospital Stay (HOSPITAL_COMMUNITY)
Admission: EM | Admit: 2013-12-06 | Discharge: 2014-01-03 | DRG: 871 | Disposition: E | Payer: Medicare Other | Attending: Pulmonary Disease | Admitting: Pulmonary Disease

## 2013-12-06 DIAGNOSIS — R6521 Severe sepsis with septic shock: Secondary | ICD-10-CM

## 2013-12-06 DIAGNOSIS — Z7901 Long term (current) use of anticoagulants: Secondary | ICD-10-CM

## 2013-12-06 DIAGNOSIS — R4182 Altered mental status, unspecified: Secondary | ICD-10-CM

## 2013-12-06 DIAGNOSIS — K219 Gastro-esophageal reflux disease without esophagitis: Secondary | ICD-10-CM | POA: Diagnosis present

## 2013-12-06 DIAGNOSIS — A419 Sepsis, unspecified organism: Principal | ICD-10-CM | POA: Diagnosis present

## 2013-12-06 DIAGNOSIS — J811 Chronic pulmonary edema: Secondary | ICD-10-CM

## 2013-12-06 DIAGNOSIS — E872 Acidosis, unspecified: Secondary | ICD-10-CM | POA: Diagnosis present

## 2013-12-06 DIAGNOSIS — Z87891 Personal history of nicotine dependence: Secondary | ICD-10-CM

## 2013-12-06 DIAGNOSIS — Z923 Personal history of irradiation: Secondary | ICD-10-CM

## 2013-12-06 DIAGNOSIS — J189 Pneumonia, unspecified organism: Secondary | ICD-10-CM | POA: Diagnosis present

## 2013-12-06 DIAGNOSIS — N390 Urinary tract infection, site not specified: Secondary | ICD-10-CM | POA: Diagnosis present

## 2013-12-06 DIAGNOSIS — E785 Hyperlipidemia, unspecified: Secondary | ICD-10-CM | POA: Diagnosis present

## 2013-12-06 DIAGNOSIS — E119 Type 2 diabetes mellitus without complications: Secondary | ICD-10-CM | POA: Diagnosis present

## 2013-12-06 DIAGNOSIS — I4891 Unspecified atrial fibrillation: Secondary | ICD-10-CM | POA: Diagnosis present

## 2013-12-06 DIAGNOSIS — E669 Obesity, unspecified: Secondary | ICD-10-CM | POA: Diagnosis present

## 2013-12-06 DIAGNOSIS — I509 Heart failure, unspecified: Secondary | ICD-10-CM | POA: Diagnosis present

## 2013-12-06 DIAGNOSIS — Z96659 Presence of unspecified artificial knee joint: Secondary | ICD-10-CM

## 2013-12-06 DIAGNOSIS — J9690 Respiratory failure, unspecified, unspecified whether with hypoxia or hypercapnia: Secondary | ICD-10-CM

## 2013-12-06 DIAGNOSIS — Z853 Personal history of malignant neoplasm of breast: Secondary | ICD-10-CM

## 2013-12-06 DIAGNOSIS — Z954 Presence of other heart-valve replacement: Secondary | ICD-10-CM

## 2013-12-06 DIAGNOSIS — R6520 Severe sepsis without septic shock: Secondary | ICD-10-CM

## 2013-12-06 DIAGNOSIS — I10 Essential (primary) hypertension: Secondary | ICD-10-CM | POA: Diagnosis present

## 2013-12-06 DIAGNOSIS — N179 Acute kidney failure, unspecified: Secondary | ICD-10-CM | POA: Diagnosis present

## 2013-12-06 DIAGNOSIS — J9600 Acute respiratory failure, unspecified whether with hypoxia or hypercapnia: Secondary | ICD-10-CM | POA: Diagnosis present

## 2013-12-06 DIAGNOSIS — Z79899 Other long term (current) drug therapy: Secondary | ICD-10-CM

## 2013-12-06 DIAGNOSIS — E875 Hyperkalemia: Secondary | ICD-10-CM | POA: Diagnosis present

## 2013-12-06 DIAGNOSIS — D649 Anemia, unspecified: Secondary | ICD-10-CM | POA: Diagnosis present

## 2013-12-06 DIAGNOSIS — I959 Hypotension, unspecified: Secondary | ICD-10-CM

## 2013-12-06 DIAGNOSIS — I428 Other cardiomyopathies: Secondary | ICD-10-CM | POA: Diagnosis present

## 2013-12-06 DIAGNOSIS — Z7982 Long term (current) use of aspirin: Secondary | ICD-10-CM

## 2013-12-06 LAB — COMPREHENSIVE METABOLIC PANEL
ALBUMIN: 2.5 g/dL — AB (ref 3.5–5.2)
ALK PHOS: 147 U/L — AB (ref 39–117)
ALT: 43 U/L — AB (ref 0–35)
ALT: 66 U/L — ABNORMAL HIGH (ref 0–35)
AST: 77 U/L — AB (ref 0–37)
AST: 127 U/L — ABNORMAL HIGH (ref 0–37)
Albumin: 2.3 g/dL — ABNORMAL LOW (ref 3.5–5.2)
Alkaline Phosphatase: 164 U/L — ABNORMAL HIGH (ref 39–117)
BUN: 59 mg/dL — ABNORMAL HIGH (ref 6–23)
BUN: 61 mg/dL — ABNORMAL HIGH (ref 6–23)
CHLORIDE: 94 meq/L — AB (ref 96–112)
CO2: 7 mEq/L — CL (ref 19–32)
CO2: 8 mEq/L — CL (ref 19–32)
CREATININE: 1.71 mg/dL — AB (ref 0.50–1.10)
Calcium: 9.7 mg/dL (ref 8.4–10.5)
Calcium: 9.3 mg/dL (ref 8.4–10.5)
Chloride: 94 mEq/L — ABNORMAL LOW (ref 96–112)
Creatinine, Ser: 1.83 mg/dL — ABNORMAL HIGH (ref 0.50–1.10)
GFR calc Af Amer: 31 mL/min — ABNORMAL LOW (ref >90)
GFR calc Af Amer: 34 mL/min — ABNORMAL LOW (ref >90)
GFR calc non Af Amer: 29 mL/min — ABNORMAL LOW (ref >90)
GFR, EST NON AFRICAN AMERICAN: 27 mL/min — AB (ref >90)
GLUCOSE: 164 mg/dL — AB (ref 70–99)
Glucose, Bld: 211 mg/dL — ABNORMAL HIGH (ref 70–99)
POTASSIUM: 7.3 meq/L — AB (ref 3.7–5.3)
Potassium: 7.5 mEq/L (ref 3.7–5.3)
SODIUM: 132 meq/L — AB (ref 137–147)
Sodium: 132 mEq/L — ABNORMAL LOW (ref 137–147)
TOTAL PROTEIN: 6.5 g/dL (ref 6.0–8.3)
Total Bilirubin: 0.9 mg/dL (ref 0.3–1.2)
Total Bilirubin: 1.3 mg/dL — ABNORMAL HIGH (ref 0.3–1.2)
Total Protein: 6.1 g/dL (ref 6.0–8.3)

## 2013-12-06 LAB — CBC WITH DIFFERENTIAL/PLATELET
Basophils Absolute: 0 10*3/uL (ref 0.0–0.1)
Basophils Relative: 0 % (ref 0–1)
EOS PCT: 0 % (ref 0–5)
Eosinophils Absolute: 0 10*3/uL (ref 0.0–0.7)
HCT: 36.6 % (ref 36.0–46.0)
Hemoglobin: 11 g/dL — ABNORMAL LOW (ref 12.0–15.0)
Lymphocytes Relative: 7 % — ABNORMAL LOW (ref 12–46)
Lymphs Abs: 1.9 10*3/uL (ref 0.7–4.0)
MCH: 28.2 pg (ref 26.0–34.0)
MCHC: 30.1 g/dL (ref 30.0–36.0)
MCV: 93.8 fL (ref 78.0–100.0)
Monocytes Absolute: 1.4 10*3/uL — ABNORMAL HIGH (ref 0.1–1.0)
Monocytes Relative: 5 % (ref 3–12)
Neutro Abs: 23.8 10*3/uL — ABNORMAL HIGH (ref 1.7–7.7)
Neutrophils Relative %: 88 % — ABNORMAL HIGH (ref 43–77)
PLATELETS: 225 10*3/uL (ref 150–400)
RBC: 3.9 MIL/uL (ref 3.87–5.11)
RDW: 20.2 % — AB (ref 11.5–15.5)
WBC Morphology: INCREASED
WBC: 27.1 10*3/uL — ABNORMAL HIGH (ref 4.0–10.5)

## 2013-12-06 LAB — POCT I-STAT 3, ART BLOOD GAS (G3+)
ACID-BASE DEFICIT: 20 mmol/L — AB (ref 0.0–2.0)
Bicarbonate: 9.7 mEq/L — ABNORMAL LOW (ref 20.0–24.0)
O2 SAT: 99 %
Patient temperature: 95.9
TCO2: 11 mmol/L (ref 0–100)
pCO2 arterial: 32.1 mmHg — ABNORMAL LOW (ref 35.0–45.0)
pH, Arterial: 7.077 — CL (ref 7.350–7.450)
pO2, Arterial: 152 mmHg — ABNORMAL HIGH (ref 80.0–100.0)

## 2013-12-06 LAB — POCT I-STAT 3, VENOUS BLOOD GAS (G3P V)
ACID-BASE DEFICIT: 25 mmol/L — AB (ref 0.0–2.0)
BICARBONATE: 8.2 meq/L — AB (ref 20.0–24.0)
O2 SAT: 69 %
TCO2: 9 mmol/L (ref 0–100)
pCO2, Ven: 40.1 mmHg — ABNORMAL LOW (ref 45.0–50.0)
pH, Ven: 6.908 — CL (ref 7.250–7.300)
pO2, Ven: 55 mmHg — ABNORMAL HIGH (ref 30.0–45.0)

## 2013-12-06 LAB — URINALYSIS, ROUTINE W REFLEX MICROSCOPIC
GLUCOSE, UA: NEGATIVE mg/dL
Ketones, ur: 15 mg/dL — AB
Nitrite: NEGATIVE
PROTEIN: 100 mg/dL — AB
SPECIFIC GRAVITY, URINE: 1.026 (ref 1.005–1.030)
Urobilinogen, UA: 0.2 mg/dL (ref 0.0–1.0)
pH: 5 (ref 5.0–8.0)

## 2013-12-06 LAB — POCT I-STAT, CHEM 8
BUN: 70 mg/dL — ABNORMAL HIGH (ref 6–23)
CREATININE: 2 mg/dL — AB (ref 0.50–1.10)
Calcium, Ion: 1.13 mmol/L (ref 1.13–1.30)
Chloride: 107 mEq/L (ref 96–112)
GLUCOSE: 68 mg/dL — AB (ref 70–99)
HCT: 38 % (ref 36.0–46.0)
Hemoglobin: 12.9 g/dL (ref 12.0–15.0)
Potassium: 8.5 mEq/L (ref 3.7–5.3)
Sodium: 129 mEq/L — ABNORMAL LOW (ref 137–147)
TCO2: 9 mmol/L (ref 0–100)

## 2013-12-06 LAB — CBC
HEMATOCRIT: 32.6 % — AB (ref 36.0–46.0)
Hemoglobin: 9.7 g/dL — ABNORMAL LOW (ref 12.0–15.0)
MCH: 27.5 pg (ref 26.0–34.0)
MCHC: 29.8 g/dL — AB (ref 30.0–36.0)
MCV: 92.4 fL (ref 78.0–100.0)
Platelets: 202 10*3/uL (ref 150–400)
RBC: 3.53 MIL/uL — ABNORMAL LOW (ref 3.87–5.11)
RDW: 20.1 % — AB (ref 11.5–15.5)
WBC: 36.5 10*3/uL — ABNORMAL HIGH (ref 4.0–10.5)

## 2013-12-06 LAB — URINE MICROSCOPIC-ADD ON

## 2013-12-06 LAB — GLUCOSE, CAPILLARY
GLUCOSE-CAPILLARY: 69 mg/dL — AB (ref 70–99)
Glucose-Capillary: 124 mg/dL — ABNORMAL HIGH (ref 70–99)
Glucose-Capillary: 179 mg/dL — ABNORMAL HIGH (ref 70–99)

## 2013-12-06 LAB — PROTIME-INR
INR: 8.5 (ref 0.00–1.49)
INR: 7.65 — AB (ref 0.00–1.49)
PROTHROMBIN TIME: 66.6 s — AB (ref 11.6–15.2)
Prothrombin Time: 61.5 seconds — ABNORMAL HIGH (ref 11.6–15.2)

## 2013-12-06 LAB — TYPE AND SCREEN
ABO/RH(D): A POS
ANTIBODY SCREEN: NEGATIVE

## 2013-12-06 LAB — POCT I-STAT TROPONIN I: Troponin i, poc: 0.09 ng/mL (ref 0.00–0.08)

## 2013-12-06 LAB — FIBRINOGEN: Fibrinogen: 387 mg/dL (ref 204–475)

## 2013-12-06 LAB — LIPASE, BLOOD: Lipase: 458 U/L — ABNORMAL HIGH (ref 11–59)

## 2013-12-06 LAB — APTT: aPTT: 42 seconds — ABNORMAL HIGH (ref 24–37)

## 2013-12-06 LAB — CG4 I-STAT (LACTIC ACID): Lactic Acid, Venous: 17.41 mmol/L — ABNORMAL HIGH (ref 0.5–2.2)

## 2013-12-06 LAB — CORTISOL: CORTISOL PLASMA: 60 ug/dL

## 2013-12-06 LAB — LACTIC ACID, PLASMA: LACTIC ACID, VENOUS: 12.9 mmol/L — AB (ref 0.5–2.2)

## 2013-12-06 LAB — TROPONIN I: Troponin I: <0.3 ng/mL (ref <0.30)

## 2013-12-06 LAB — PRO B NATRIURETIC PEPTIDE: Pro B Natriuretic peptide (BNP): 52974 pg/mL — ABNORMAL HIGH (ref 0–125)

## 2013-12-06 MED ORDER — HEPARIN SODIUM (PORCINE) 5000 UNIT/ML IJ SOLN
5000.0000 [IU] | Freq: Three times a day (TID) | INTRAMUSCULAR | Status: DC
  Filled 2013-12-06 (×3): qty 1

## 2013-12-06 MED ORDER — HYDROCORTISONE NA SUCCINATE PF 100 MG IJ SOLR
50.0000 mg | Freq: Four times a day (QID) | INTRAMUSCULAR | Status: DC
  Filled 2013-12-06 (×4): qty 1

## 2013-12-06 MED ORDER — SODIUM CHLORIDE 0.9 % IV BOLUS (SEPSIS): 1000.0000 mL | INTRAVENOUS | Status: DC | PRN

## 2013-12-06 MED ORDER — PANTOPRAZOLE SODIUM 40 MG IV SOLR
40.0000 mg | INTRAVENOUS | Status: DC
  Administered 2013-12-06: 40 mg via INTRAVENOUS
  Filled 2013-12-06: qty 40

## 2013-12-06 MED ORDER — SODIUM BICARBONATE 8.4 % IV SOLN
INTRAVENOUS | Status: DC | PRN
  Administered 2013-12-06: 50 meq via INTRAVENOUS

## 2013-12-06 MED ORDER — PIPERACILLIN-TAZOBACTAM IN DEX 2-0.25 GM/50ML IV SOLN
2.2500 g | Freq: Once | INTRAVENOUS | Status: AC
  Administered 2013-12-06: 2.25 g via INTRAVENOUS
  Filled 2013-12-06: qty 50

## 2013-12-06 MED ORDER — CALCIUM GLUCONATE 10 % IV SOLN
1.0000 g | Freq: Once | INTRAVENOUS | Status: AC
  Administered 2013-12-06: 1 g via INTRAVENOUS
  Filled 2013-12-06: qty 10

## 2013-12-06 MED ORDER — FENTANYL CITRATE 0.05 MG/ML IJ SOLN
50.0000 ug | Freq: Once | INTRAMUSCULAR | Status: AC
  Administered 2013-12-06: 50 ug via INTRAVENOUS
  Filled 2013-12-06: qty 2

## 2013-12-06 MED ORDER — VASOPRESSIN 20 UNIT/ML IJ SOLN
0.0300 [IU]/min | INTRAVENOUS | Status: DC
  Administered 2013-12-06: 0.03 [IU]/min via INTRAVENOUS
  Filled 2013-12-06: qty 2.5

## 2013-12-06 MED ORDER — LEVOFLOXACIN IN D5W 750 MG/150ML IV SOLN: 750.0000 mg | INTRAVENOUS | Status: DC

## 2013-12-06 MED ORDER — DOPAMINE-DEXTROSE 3.2-5 MG/ML-% IV SOLN
INTRAVENOUS | Status: AC
  Administered 2013-12-06: 800 mg
  Filled 2013-12-06: qty 250

## 2013-12-06 MED ORDER — INSULIN ASPART 100 UNIT/ML ~~LOC~~ SOLN
2.0000 [IU] | SUBCUTANEOUS | Status: DC
  Administered 2013-12-06: 4 [IU] via SUBCUTANEOUS

## 2013-12-06 MED ORDER — LIDOCAINE HCL (CARDIAC) 20 MG/ML IV SOLN
INTRAVENOUS | Status: AC
  Filled 2013-12-06: qty 5

## 2013-12-06 MED ORDER — NOREPINEPHRINE BITARTRATE 1 MG/ML IJ SOLN
5.0000 ug/min | Freq: Once | INTRAVENOUS | Status: AC
  Administered 2013-12-06: 5 ug/min via INTRAVENOUS

## 2013-12-06 MED ORDER — SODIUM BICARBONATE 8.4 % IV SOLN
INTRAVENOUS | Status: DC
  Administered 2013-12-06: 13:00:00 via INTRAVENOUS
  Filled 2013-12-06 (×3): qty 150

## 2013-12-06 MED ORDER — VANCOMYCIN HCL 10 G IV SOLR: 1500.0000 mg | INTRAVENOUS | Status: DC

## 2013-12-06 MED ORDER — SUCCINYLCHOLINE CHLORIDE 20 MG/ML IJ SOLN
INTRAMUSCULAR | Status: AC
  Filled 2013-12-06: qty 1

## 2013-12-06 MED ORDER — SODIUM CHLORIDE 0.9 % IV BOLUS (SEPSIS)
2000.0000 mL | Freq: Once | INTRAVENOUS | Status: AC
  Administered 2013-12-06: 2000 mL via INTRAVENOUS

## 2013-12-06 MED ORDER — DEXTROSE 50 % IV SOLN
1.0000 | Freq: Once | INTRAVENOUS | Status: AC
  Administered 2013-12-06: 50 mL via INTRAVENOUS
  Filled 2013-12-06: qty 50

## 2013-12-06 MED ORDER — SODIUM CHLORIDE 0.9 % IV SOLN
2.0000 mg/h | INTRAVENOUS | Status: DC
  Administered 2013-12-06: 2 mg/h via INTRAVENOUS
  Filled 2013-12-06: qty 10

## 2013-12-06 MED ORDER — LEVOFLOXACIN IN D5W 750 MG/150ML IV SOLN: 750.0000 mg | Freq: Once | INTRAVENOUS | Status: DC

## 2013-12-06 MED ORDER — DOBUTAMINE IN D5W 4-5 MG/ML-% IV SOLN: 2.5000 ug/kg/min | INTRAVENOUS | Status: DC

## 2013-12-06 MED ORDER — SODIUM POLYSTYRENE SULFONATE 15 GM/60ML PO SUSP
30.0000 g | Freq: Once | ORAL | Status: DC
  Filled 2013-12-06: qty 120

## 2013-12-06 MED ORDER — NOREPINEPHRINE BITARTRATE 1 MG/ML IJ SOLN
5.0000 ug/min | INTRAVENOUS | Status: DC
  Filled 2013-12-06: qty 4

## 2013-12-06 MED ORDER — CALCIUM CHLORIDE 10 % IV SOLN
INTRAVENOUS | Status: DC | PRN
  Administered 2013-12-06: 1 g via INTRAVENOUS

## 2013-12-06 MED ORDER — CEFEPIME HCL 2 G IJ SOLR: 2.0000 g | Freq: Once | INTRAMUSCULAR | Status: DC

## 2013-12-06 MED ORDER — VANCOMYCIN HCL IN DEXTROSE 1-5 GM/200ML-% IV SOLN: 1000.0000 mg | Freq: Once | INTRAVENOUS | Status: DC

## 2013-12-06 MED ORDER — VANCOMYCIN HCL IN DEXTROSE 1-5 GM/200ML-% IV SOLN
1000.0000 mg | Freq: Once | INTRAVENOUS | Status: AC
  Administered 2013-12-06: 1000 mg via INTRAVENOUS
  Filled 2013-12-06: qty 200

## 2013-12-06 MED ORDER — ROCURONIUM BROMIDE 50 MG/5ML IV SOLN
INTRAVENOUS | Status: AC
  Filled 2013-12-06: qty 2

## 2013-12-06 MED ORDER — VITAMIN K1 10 MG/ML IJ SOLN
10.0000 mg | Freq: Once | INTRAVENOUS | Status: AC
  Administered 2013-12-06: 10 mg via INTRAVENOUS
  Filled 2013-12-06: qty 1

## 2013-12-06 MED ORDER — MIDAZOLAM HCL 2 MG/2ML IJ SOLN
2.0000 mg | Freq: Once | INTRAMUSCULAR | Status: AC
  Administered 2013-12-06: 2 mg via INTRAVENOUS
  Filled 2013-12-06: qty 2

## 2013-12-06 MED ORDER — ETOMIDATE 2 MG/ML IV SOLN
INTRAVENOUS | Status: AC
  Administered 2013-12-06: 30 mg
  Filled 2013-12-06: qty 20

## 2013-12-06 MED ORDER — DOPAMINE-DEXTROSE 1.6-5 MG/ML-% IV SOLN
5.0000 ug/kg/min | Freq: Once | INTRAVENOUS | Status: DC
  Filled 2013-12-06: qty 250

## 2013-12-06 MED ORDER — SODIUM CHLORIDE 0.9 % IV SOLN
10.0000 ug/h | INTRAVENOUS | Status: DC
  Administered 2013-12-06: 10 ug/h via INTRAVENOUS
  Filled 2013-12-06: qty 50

## 2013-12-06 MED ORDER — DEXTROSE 5 % IV SOLN: 1.0000 g | INTRAVENOUS | Status: DC

## 2013-12-06 MED FILL — Medication: Qty: 1 | Status: AC

## 2013-12-08 LAB — URINE CULTURE: Colony Count: >=100000

## 2013-12-12 LAB — CULTURE, BLOOD (ROUTINE X 2)
CULTURE: NO GROWTH
Culture: NO GROWTH

## 2013-12-12 NOTE — Discharge Summary (Signed)
[name], [name]                ACCOUNT NO.:  [id number]  MEDICAL RECORD NO.:  [id number]  LOCATION:  2M01C                        FACILITY:  [facility]  PHYSICIAN:  Providence Lanius, MD  DATE OF BIRTH:  [birth year]  DATE OF ADMISSION:  12/12/2013 DATE OF DISCHARGE:  01/01/2014                              DISCHARGE SUMMARY   DEATH SUMMARY  PRIMARY DIAGNOSIS/CAUSE OF DEATH:  Pneumonia.  SECONDARY DIAGNOSES:  Urinary tract infection, septic shock, respiratory failure, severe acidosis.  HISTORY:  The patient is a 73 year old female with extensive past medical history, who presented to the hospital from nursing home with a UTI and chest x-ray consistent with pneumonia.  The patient developed respiratory failure, was promptly intubated, and central line was placed for septic shock; however, the patient was profoundly acidotic and after discussion with the family, family decided that the patient would not want CPR or cardioversion.  Resuscitative efforts were continued; however, pressors were started, but the patient decompensated with refractory shock and suffered a cardiac arrest, at which point, she was declared.  The family was notified.     Providence Lanius, MD     WJY/MEDQ  D:  12/11/2013  T:  12/12/2013  Job:  [id number]

## 2013-12-14 ENCOUNTER — Ambulatory Visit: Payer: Medicare Other | Admitting: Gastroenterology

## 2013-12-22 ENCOUNTER — Ambulatory Visit: Payer: Medicare Other | Admitting: Cardiovascular Disease

## 2014-01-03 NOTE — ED Notes (Signed)
Dr. Plunkett at bedside.  

## 2014-01-03 NOTE — ED Notes (Signed)
Pt placed on 15 lpm Nonrebreather.

## 2014-01-03 NOTE — ED Notes (Signed)
Respiratory at bedside.

## 2014-01-03 NOTE — ED Notes (Signed)
Phlebotomy at bedside for blood.

## 2014-01-03 NOTE — ED Notes (Signed)
Pt placed under [employer].

## 2014-01-03 NOTE — Consult Note (Signed)
Renal Service Consult Note [facility]  [name] 12/07/2013 Maree Krabbe Requesting Physician:  Dr Molli Knock  Reason for Consult:  Acute renal failure HPI: The patient is a 73 y.o. year-old with hx of HTN, AS, CHF sent from SNF to ED for N/V x 2days.  In ED she was found to be unresponsive, hypotensive and hypoxic.  She was intubated. CXR showing bilat PNA vs ARDS.  Severely acidemic, art pH 7.077.  Serum K+ 7.3, CO2 8, creat 1.71.  LFT's up moderately.  Trop <0.30. CVP 11-16.  Admitted to ICU and started on empiric IV abx, dopamine / levo / vaso drips and bicarb drip.  Renal svc consulted for high K and AKI.       ROS  not available  Past Medical History  Past Medical History  Diagnosis Date  . Adenomatous polyps   . Hemorrhoids, external   . Constipation, chronic   . Hypertension   . CHF (congestive heart failure)   . Aortic stenosis, moderate   . Hyperlipidemia   . Carotid bruit   . Osteoarthritis   . Edema   . Hemoptysis   . Cancer     BREAST-HX OF  . Dysmetabolic syndrome X   . Low back pain syndrome   . Diabetes mellitus     TYPE 2  . Anemia   . Obesity   . Radiation   . GERD (gastroesophageal reflux disease)     hiatal hernia  . Dysrhythmia     afib   Past Surgical History  Past Surgical History  Procedure Laterality Date  . Colonoscopy      AUGUST 2009  . Abdominal hysterectomy    . Breast lumpectomy      2009  . Tubal ligation    . Polypectomy      2004  . Replacement total knee      BILATERAL  . Aortic valve replacement    . Breast recision      LEFT BREAST RECISION OF INFERIOR MARGIN  . Tee without cardioversion N/A 11/18/2013    Procedure: TRANSESOPHAGEAL ECHOCARDIOGRAM (TEE);  Surgeon: Laurey Morale, MD;  Location: [facility] ENDOSCOPY;  Service: Cardiovascular;  Laterality: N/A;  . Cardioversion N/A 11/18/2013    Procedure: CARDIOVERSION;  Surgeon: Laurey Morale, MD;  Location: [facility] ENDOSCOPY;  Service: Cardiovascular;   Laterality: N/A;   Family History  Family History  Problem Relation Age of Onset  . Stroke Mother   . Aneurysm Mother   . Kidney disease Father   . Diabetes Sister   . Diabetes Brother   . Heart disease Brother   . Diabetes Sister   . Diabetes Brother   . Colon cancer Other   . Colon polyps Other    Social History  reports that she quit smoking about 41 years ago. She does not have any smokeless tobacco history on file. She reports that she does not drink alcohol or use illicit drugs. Allergies  Allergies  Allergen Reactions  . Cephalexin Diarrhea    Also stomachache  . Tramadol Nausea And Vomiting    "tore her stomach up"  . Septra [Bactrim]     Yeast infection  . Septra [Sulfamethoxazole-Tmp Ds]     "Gave me a yeast infection"   Home medications Prior to Admission medications   Medication Sig Start Date End Date Taking? Authorizing Provider  furosemide (LASIX) 40 MG tablet Take 40 mg by mouth 2 (two) times daily.   Yes Historical Provider, MD  amiodarone (PACERONE) 200 MG tablet Take 1 tablet (200 mg total) by mouth daily. 11/24/13   Tarri Fuller, PA-C  apixaban (ELIQUIS) 5 MG TABS tablet Take 1 tablet (5 mg total) by mouth 2 (two) times daily. 11/24/13   Tarri Fuller, PA-C  aspirin 81 MG tablet Take 81 mg by mouth daily.      Historical Provider, MD  Calcium-Magnesium 500-250 MG TABS Take 1 tablet by mouth daily.     Historical Provider, MD  Chromium 200 MCG CAPS Take 1 each by mouth daily.      Historical Provider, MD  meloxicam (MOBIC) 7.5 MG tablet Take 7.5 mg by mouth 2 (two) times daily.    Historical Provider, MD  metoprolol succinate (TOPROL-XL) 50 MG 24 hr tablet Take 1 tablet (50 mg total) by mouth daily. Take with or immediately following a meal. 11/24/13   Tarri Fuller, PA-C  Mometasone Furo-Formoterol Fum (DULERA) 200-5 MCG/ACT AERO Inhale 2 Inhalers into the lungs 2 (two) times daily as needed.      Historical Provider, MD  montelukast (SINGULAIR) 10 MG tablet Take  1 tablet (10 mg total) by mouth at bedtime. 08/06/13   Hendricks Limes, MD  Multiple Vitamin (MULTIVITAMIN) tablet Take 1 tablet by mouth daily.      Historical Provider, MD  nitroGLYCERIN (NITROSTAT) 0.4 MG SL tablet Place 1 tablet (0.4 mg total) under the tongue every 5 (five) minutes x 3 doses as needed for chest pain. 11/24/13   Tarri Fuller, PA-C  potassium chloride SA (K-DUR,KLOR-CON) 20 MEQ tablet Take 1 tablet (20 mEq total) by mouth 2 (two) times daily. 01/22/13 01/22/14  Josue Hector, MD  ranitidine (ZANTAC) 150 MG tablet Take 150 mg by mouth 2 (two) times daily.    Historical Provider, MD  rosuvastatin (CRESTOR) 20 MG tablet Take 20 mg by mouth daily.    Historical Provider, MD  sitaGLIPtin-metformin (JANUMET) 50-1000 MG per tablet Take 1 tablet by mouth 2 (two) times daily with a meal.    Historical Provider, MD  vitamin E 400 UNIT capsule Take 400 Units by mouth daily.    Historical Provider, MD   Liver Function Tests  Recent Labs Lab 2013/12/14 1000  AST 77*  ALT 43*  ALKPHOS 147*  BILITOT 0.9  PROT 6.1  ALBUMIN 2.3*    Recent Labs Lab December 14, 2013 0837  LIPASE 458*   CBC  Recent Labs Lab 12-14-2013 0837 December 14, 2013 0854 14-Dec-2013 1052  WBC 27.1*  --  36.5*  NEUTROABS 23.8*  --   --   HGB 11.0* 12.9 9.7*  HCT 36.6 38.0 32.6*  MCV 93.8  --  92.4  PLT 225  --  557   Basic Metabolic Panel  Recent Labs Lab 12-14-13 0854 2013-12-14 1000  NA 129* 132*  K 8.5* 7.3*  CL 107 94*  CO2  --  7*  GLUCOSE 68* 164*  BUN 70* 59*  CREATININE 2.00* 1.83*  CALCIUM  --  9.7    Exam  Blood pressure 90/31, pulse 65, temperature 96.7 F (35.9 C), temperature source Rectal, resp. rate 26, height 5\' 6"  (1.676 m), weight 107 kg (235 lb 14.3 oz), SpO2 99.00%. 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 In the middle of our conversation patient went bradycardic and then passed away.  She had been made NCB earlier this am.    Kelly Splinter MD (pgr) [phone]    ([phone] 12-14-13, 1:36 PM

## 2014-01-03 NOTE — ED Notes (Signed)
CBG 124 

## 2014-01-03 NOTE — ED Notes (Signed)
Report given to [place], Therapist, sports.

## 2014-01-03 NOTE — ED Notes (Signed)
Assigned room not clean at the time. Floor to call back when room is ready.

## 2014-01-03 NOTE — ED Notes (Signed)
[facility] called for code sepsis to be paged out.

## 2014-01-03 NOTE — ED Notes (Signed)
Lactic Acid, VBG poc test results shown to DR.Plunkett.

## 2014-01-03 NOTE — ED Notes (Signed)
Pt remains sedated on ventilator. Daughter at bedside. CCM at bedside to admit patient.

## 2014-01-03 NOTE — ED Notes (Signed)
Pt arrived [facility] from [facility] for nausea/vomiting for 2 days. EMS reports "unable to auscultate BP, heart rate 50's to 80's, sats in the upper 90's on 2 lpm [place]."   Patient is cool in all four extremities, barely detectable radial pulses, unresponsive, tachypneic.

## 2014-01-03 NOTE — Progress Notes (Signed)
RR increased per ABG and MD verbal order.

## 2014-01-03 NOTE — ED Notes (Signed)
Radiology at bedside for portable chest.

## 2014-01-03 NOTE — ED Provider Notes (Addendum)
CSN: [id number]     Arrival date & time 01-04-2014  0820 History   First MD Initiated Contact with Patient 2014/01/04 0818     No chief complaint on file.  (Consider location/radiation/quality/duration/timing/severity/associated sxs/prior Treatment) HPI Comments: Nursing home gave the report of nausea and vomiting for the last 2-3 days for altered mental status today. Patient is unable to answer any questions here and family is not present currently.  The history is provided by the EMS personnel and the nursing home. The history is limited by the condition of the patient and the absence of a caregiver.    Past Medical History  Diagnosis Date  . Adenomatous polyps   . Hemorrhoids, external   . Constipation, chronic   . Hypertension   . CHF (congestive heart failure)   . Aortic stenosis, moderate   . Hyperlipidemia   . Carotid bruit   . Osteoarthritis   . Edema   . Hemoptysis   . Cancer     BREAST-HX OF  . Dysmetabolic syndrome X   . Low back pain syndrome   . Diabetes mellitus     TYPE 2  . Anemia   . Obesity   . Radiation   . GERD (gastroesophageal reflux disease)     hiatal hernia  . Dysrhythmia     afib   Past Surgical History  Procedure Laterality Date  . Colonoscopy      AUGUST 2009  . Abdominal hysterectomy    . Breast lumpectomy      2009  . Tubal ligation    . Polypectomy      2004  . Replacement total knee      BILATERAL  . Aortic valve replacement    . Breast recision      LEFT BREAST RECISION OF INFERIOR MARGIN  . Tee without cardioversion N/A 11/18/2013    Procedure: TRANSESOPHAGEAL ECHOCARDIOGRAM (TEE);  Surgeon: Larey Dresser, MD;  Location: [facility];  Service: Cardiovascular;  Laterality: N/A;  . Cardioversion N/A 11/18/2013    Procedure: CARDIOVERSION;  Surgeon: Larey Dresser, MD;  Location: [facility] ENDOSCOPY;  Service: Cardiovascular;  Laterality: N/A;   Family History  Problem Relation Age of Onset  . Stroke Mother   . Aneurysm Mother   .  Kidney disease Father   . Diabetes Sister   . Diabetes Brother   . Heart disease Brother   . Diabetes Sister   . Diabetes Brother   . Colon cancer Other   . Colon polyps Other    History  Substance Use Topics  . Smoking status: Former Smoker    Quit date: 11/13/1972  . Smokeless tobacco: Not on file     Comment: smoked 1968-1974, up to 3 cigarettes  . Alcohol Use: No   OB History   Grav Para Term Preterm Abortions TAB SAB Ect Mult Living                 Review of Systems  Unable to perform ROS   Allergies  Cephalexin; Tramadol; Septra; and Septra  Home Medications   Current Outpatient Rx  Name  Route  Sig  Dispense  Refill  . amiodarone (PACERONE) 200 MG tablet   Oral   Take 1 tablet (200 mg total) by mouth daily.   60 tablet   5     400 mg twice a day for the next 3 days, 200 mg twi ...   . apixaban (ELIQUIS) 5 MG TABS tablet   Oral  Take 1 tablet (5 mg total) by mouth 2 (two) times daily.   60 tablet   11   . aspirin 81 MG tablet   Oral   Take 81 mg by mouth daily.           . Calcium-Magnesium 500-250 MG TABS   Oral   Take 1 tablet by mouth daily.          . Chromium 200 MCG CAPS   Oral   Take 1 each by mouth daily.           . furosemide (LASIX) 40 MG tablet   Oral   Take 1 tablet (40 mg total) by mouth 2 (two) times daily.   180 tablet   3   . meloxicam (MOBIC) 7.5 MG tablet   Oral   Take 7.5 mg by mouth 2 (two) times daily.         . metoprolol succinate (TOPROL-XL) 50 MG 24 hr tablet   Oral   Take 1 tablet (50 mg total) by mouth daily. Take with or immediately following a meal.   30 tablet   5   . Mometasone Furo-Formoterol Fum (DULERA) 200-5 MCG/ACT AERO   Inhalation   Inhale 2 Inhalers into the lungs 2 (two) times daily as needed.           . montelukast (SINGULAIR) 10 MG tablet   Oral   Take 1 tablet (10 mg total) by mouth at bedtime.   30 tablet   3   . Multiple Vitamin (MULTIVITAMIN) tablet   Oral   Take 1  tablet by mouth daily.           . nitroGLYCERIN (NITROSTAT) 0.4 MG SL tablet   Sublingual   Place 1 tablet (0.4 mg total) under the tongue every 5 (five) minutes x 3 doses as needed for chest pain.   25 tablet   12   . potassium chloride SA (K-DUR,KLOR-CON) 20 MEQ tablet   Oral   Take 1 tablet (20 mEq total) by mouth 2 (two) times daily.   180 tablet   3   . ranitidine (ZANTAC) 150 MG tablet   Oral   Take 150 mg by mouth 2 (two) times daily.         . rosuvastatin (CRESTOR) 20 MG tablet   Oral   Take 20 mg by mouth daily.         . sitaGLIPtin-metformin (JANUMET) 50-1000 MG per tablet   Oral   Take 1 tablet by mouth 2 (two) times daily with a meal.         . vitamin E 400 UNIT capsule   Oral   Take 400 Units by mouth daily.          There were no vitals taken for this visit. Physical Exam  Nursing note and vitals reviewed. Constitutional: She appears well-developed and well-nourished. She appears distressed.  HENT:  Head: Normocephalic and atraumatic.  Mouth/Throat: Oropharynx is clear and moist. Mucous membranes are dry.  Eyes: Conjunctivae and EOM are normal. Pupils are equal, round, and reactive to light.  Neck: Normal range of motion. Neck supple.  Cardiovascular: Normal rate.  An irregularly irregular rhythm present.  No murmur heard. Thready distal pulses  Pulmonary/Chest: Accessory muscle usage present. Tachypnea noted. No respiratory distress. She has no wheezes. She has rales.  Diffuse rales  Abdominal: Soft. She exhibits no distension. There is no tenderness. There is no rebound and no guarding.  Musculoskeletal:  Normal range of motion. She exhibits edema. She exhibits no tenderness.  3+ lower extremity pitting edema up to the knee  Neurological:  Patient is awake but does not respond to verbal stimuli and does not answer any questions  Skin: Skin is dry. No rash noted. No erythema. There is pallor.    ED Course  Procedures (including  critical care time) Labs Review Labs Reviewed  CBC WITH DIFFERENTIAL - Abnormal; Notable for the following:    WBC 27.1 (*)    Hemoglobin 11.0 (*)    RDW 20.2 (*)    Neutrophils Relative % 88 (*)    Lymphocytes Relative 7 (*)    Neutro Abs 23.8 (*)    Monocytes Absolute 1.4 (*)    All other components within normal limits  LIPASE, BLOOD - Abnormal; Notable for the following:    Lipase 458 (*)    All other components within normal limits  PROTIME-INR - Abnormal; Notable for the following:    Prothrombin Time 66.6 (*)    INR 8.50 (*)    All other components within normal limits  GLUCOSE, CAPILLARY - Abnormal; Notable for the following:    Glucose-Capillary 69 (*)    All other components within normal limits  GLUCOSE, CAPILLARY - Abnormal; Notable for the following:    Glucose-Capillary 124 (*)    All other components within normal limits  CG4 I-STAT (LACTIC ACID) - Abnormal; Notable for the following:    Lactic Acid, Venous 17.41 (*)    All other components within normal limits  POCT I-STAT 3, BLOOD GAS (G3P V) - Abnormal; Notable for the following:    pH, Ven 6.908 (*)    pCO2, Ven 40.1 (*)    pO2, Ven 55.0 (*)    Bicarbonate 8.2 (*)    Acid-base deficit 25.0 (*)    All other components within normal limits  POCT I-STAT, CHEM 8 - Abnormal; Notable for the following:    Sodium 129 (*)    Potassium 8.5 (*)    BUN 70 (*)    Creatinine, Ser 2.00 (*)    Glucose, Bld 68 (*)    All other components within normal limits  POCT I-STAT TROPONIN I - Abnormal; Notable for the following:    Troponin i, poc 0.09 (*)    All other components within normal limits  URINALYSIS, ROUTINE W REFLEX MICROSCOPIC  PRO B NATRIURETIC PEPTIDE  COMPREHENSIVE METABOLIC PANEL   Imaging Review Dg Chest Portable 1 View  2013/12/22   CLINICAL DATA:  Endotracheal tube assessment.  EXAM: PORTABLE CHEST - 1 VIEW  COMPARISON:  12/22/13  FINDINGS: An endotracheal tube has been placed since the prior study.  The tip lies 1.5 cm above the carina.  There is also new right internal jugular central venous catheter with its tip at the caval atrial junction. A new nasogastric tube passes below the diaphragm into the stomach.  No pneumothorax. Patchy areas of airspace and interstitial lung opacity bilaterally are stable consistent with pulmonary edema. Changes from previous cardiac surgery are stable.  IMPRESSION: Endotracheal tube, nasogastric tube and right internal jugular central venous line are well positioned. No pneumothorax. No other change from the earlier exam.   Electronically Signed   By: Lajean Manes M.D.   On: Dec 22, 2013 10:10   Dg Chest Port 1 View  2013/12/22   CLINICAL DATA:  Shortness of breath, CHF  EXAM: PORTABLE CHEST - 1 VIEW  COMPARISON:  11/16/2013  FINDINGS: Moderate interstitial and alveolar opacities  throughout both lungs with relative sparing of the apices, increased since previous exam. Stable cardiomegaly. . No definite effusion. Previous median sternotomy and valve surgery.  IMPRESSION: 1. Interval worsening of bilateral edema or infiltrates.   Electronically Signed   By: Oley Balm M.D.   On: December 10, 2013 08:59    EKG Interpretation   None       Date: December 10, 2013  Rate: 54  Rhythm: atrial fibrillation  QRS Axis: normal  Intervals: normal  ST/T Wave abnormalities: nonspecific ST/T changes  Conduction Disutrbances:nonspecific intraventricular conduction delay  Narrative Interpretation:   Old EKG Reviewed: changes noted new widened qrs    MDM   1. Sepsis   2. Lactic acidosis   3. Pulmonary edema   4. Respiratory failure   5. Hypotension   6. Hyperkalemia     Patient presents from her rehabilitation facility with 2-3 days of worsening symptoms. Upon arrival here patient's is awake but not responsive, tachypnea, and diffuse rales and lower extremity edema.  Per the nursing home report patient had nausea and vomiting for the last few days. Normally patient is able to  ambulate and has normal mental status.  Today concern for sepsis, pneumonia, pulmonary edema, congestive heart failure, metabolic encephalopathy, stroke versus acute renal failure and hyperkalemia.  Currently blood pressure is 119/94 oxygen saturation in the 90s with tachypnea in the 20s. Patient has not responded to verbal stimuli.  CBC, CMP, UA, lipase, BMP, INR, troponin, ABG, chest x-ray, head CT, EKG pending. Rectal temperature pending as pt feels cool to the touch.  10:24 AM Patient found to be septic with lactic acid of 17,000, leukocytosis of 27,000, metabolic acidosis with a pH of 6.85 also in acute renal failure with hyperkalemia of 8.5 and a widened rhythm on EKG. Patient was immediately given 1 amp of calcium with improvement in her QRS. Bicarbonate and dextrose also given. Patient started on IV fluids and do to her respiratory status was intubated and a central line was placed. Patient was also started on Levaquin at 5. X-ray shows diffuse pulmonary edema.  Patient was hypothermic in a bear hugger placed. Imaging showed good placement of the ET tube and IJ line. Critical care consult for further care  CRITICAL CARE Performed by: Gwyneth Sprout Total critical care time: 90 Critical care time was exclusive of separately billable procedures and treating other patients. Critical care was necessary to treat or prevent imminent or life-threatening deterioration. Critical care was time spent personally by me on the following activities: development of treatment plan with patient and/or surrogate as well as nursing, discussions with consultants, evaluation of patient's response to treatment, examination of patient, obtaining history from patient or surrogate, ordering and performing treatments and interventions, ordering and review of laboratory studies, ordering and review of radiographic studies, pulse oximetry and re-evaluation of patient's condition.  INTUBATION Performed by:  Gwyneth Sprout  Required items: required blood products, implants, devices, and special equipment available Patient identity confirmed: provided demographic data and hospital-assigned identification number Time out: Immediately prior to procedure a "time out" was called to verify the correct patient, procedure, equipment, support staff and site/side marked as required.  Indications: respiratory failure and hypoxia  Intubation method: Glidescope Laryngoscopy   Preoxygenation: BVM  Sedatives: 30Etomidate Paralytic: none  Tube Size: 7.5 cuffed  Post-procedure assessment: chest rise and ETCO2 monitor Breath sounds: equal and absent over the epigastrium Tube secured with: ETT holder Chest x-ray interpreted by radiologist and me.  Chest x-ray findings: endotracheal tube in appropriate position  Patient tolerated the procedure well with no immediate complications.   CENTRAL LINE Performed by: Blanchie Dessert Consent: The procedure was performed in an emergent situation. Required items: required blood products, implants, devices, and special equipment available Patient identity confirmed: arm band and provided demographic data Time out: Immediately prior to procedure a "time out" was called to verify the correct patient, procedure, equipment, support staff and site/side marked as required. Indications: vascular access Anesthesia: local infiltration Local anesthetic: lidocaine 1% with epinephrine Anesthetic total: 3 ml Patient sedated: no Preparation: skin prepped with 2% chlorhexidine Skin prep agent dried: skin prep agent completely dried prior to procedure Sterile barriers: all five maximum sterile barriers used - cap, mask, sterile gown, sterile gloves, and large sterile sheet Hand hygiene: hand hygiene performed prior to central venous catheter insertion  Location details: right IJ  Catheter type: triple lumen Catheter size: 8 Fr Pre-procedure: landmarks  identified Ultrasound guidance: yes Successful placement: yes Post-procedure: line sutured and dressing applied Assessment: blood return through all parts, free fluid flow, placement verified by x-ray and no pneumothorax on x-ray Patient tolerance: Patient tolerated the procedure well with no immediate complications.    Blanchie Dessert, MD 12/10/2013 1027  Blanchie Dessert, MD 12-10-13 [place], MD 12-10-13 1029

## 2014-01-03 NOTE — ED Notes (Signed)
Pt moved from POD B 17 to TRA C.

## 2014-01-03 NOTE — Progress Notes (Signed)
Chaplain paged to provide support to patient's daughter. Chaplain offered ministry of presence and emotional support. [name] requested privacy and asked to be alone. Chaplain notified Ed Network engineer. Chaplain offered continued support to patient's daughter if needed or requested.   12/14/2013 0900  Clinical Encounter Type  Visited With Family  Visit Type Initial;ED  Referral From Nurse

## 2014-01-03 NOTE — H&P (Signed)
Name: [name] MRN: [id number] DOB: 1941-10-19    ADMISSION DATE:  12/25/2013 CONSULTATION DATE:  12/28/2013  REFERRING MD :  Dr. Marta Antu PRIMARY SERVICE: PCCM  CHIEF COMPLAINT:  Septic shock, respiratory failure and renal failure  BRIEF PATIENT DESCRIPTION: 73 year old female with extensive PMH presenting from SNF after being found unresponsive.  Patient was brought to the ED where she was found to be hypotensive, hypoxic and not protecting her airway.  Patient was seen in the ED 2 wks prior and given abx for UTI.  SIGNIFICANT EVENTS / STUDIES:  2/1 admitted for septic shock and respiratory failure.  LINES / TUBES: ET tube 2/1>>> R IJ TLC 2/1>>> R radial a-line 2/1>>>  CULTURES: Blood 2/1>>> Urine 2/1>>> Sputum 2/1>>>  ANTIBIOTICS: Vanc 2/1>>> Zosyn 2/1>>> Levaquin 2/1>>>  PAST MEDICAL HISTORY :  Past Medical History  Diagnosis Date  . Adenomatous polyps   . Hemorrhoids, external   . Constipation, chronic   . Hypertension   . CHF (congestive heart failure)   . Aortic stenosis, moderate   . Hyperlipidemia   . Carotid bruit   . Osteoarthritis   . Edema   . Hemoptysis   . Cancer     BREAST-HX OF  . Dysmetabolic syndrome X   . Low back pain syndrome   . Diabetes mellitus     TYPE 2  . Anemia   . Obesity   . Radiation   . GERD (gastroesophageal reflux disease)     hiatal hernia  . Dysrhythmia     afib   Past Surgical History  Procedure Laterality Date  . Colonoscopy      AUGUST 2009  . Abdominal hysterectomy    . Breast lumpectomy      2009  . Tubal ligation    . Polypectomy      2004  . Replacement total knee      BILATERAL  . Aortic valve replacement    . Breast recision      LEFT BREAST RECISION OF INFERIOR MARGIN  . Tee without cardioversion N/A 11/18/2013    Procedure: TRANSESOPHAGEAL ECHOCARDIOGRAM (TEE);  Surgeon: Larey Dresser, MD;  Location: [facility];  Service: Cardiovascular;  Laterality: N/A;  . Cardioversion N/A  11/18/2013    Procedure: CARDIOVERSION;  Surgeon: Larey Dresser, MD;  Location: [facility];  Service: Cardiovascular;  Laterality: N/A;   Prior to Admission medications   Medication Sig Start Date End Date Taking? Authorizing Provider  furosemide (LASIX) 40 MG tablet Take 40 mg by mouth 2 (two) times daily.   Yes Historical Provider, MD  amiodarone (PACERONE) 200 MG tablet Take 1 tablet (200 mg total) by mouth daily. 11/24/13   Tarri Fuller, PA-C  apixaban (ELIQUIS) 5 MG TABS tablet Take 1 tablet (5 mg total) by mouth 2 (two) times daily. 11/24/13   Tarri Fuller, PA-C  aspirin 81 MG tablet Take 81 mg by mouth daily.      Historical Provider, MD  Calcium-Magnesium 500-250 MG TABS Take 1 tablet by mouth daily.     Historical Provider, MD  Chromium 200 MCG CAPS Take 1 each by mouth daily.      Historical Provider, MD  meloxicam (MOBIC) 7.5 MG tablet Take 7.5 mg by mouth 2 (two) times daily.    Historical Provider, MD  metoprolol succinate (TOPROL-XL) 50 MG 24 hr tablet Take 1 tablet (50 mg total) by mouth daily. Take with or immediately following a meal. 11/24/13   Tarri Fuller,  PA-C  Mometasone Furo-Formoterol Fum (DULERA) 200-5 MCG/ACT AERO Inhale 2 Inhalers into the lungs 2 (two) times daily as needed.      Historical Provider, MD  montelukast (SINGULAIR) 10 MG tablet Take 1 tablet (10 mg total) by mouth at bedtime. 08/06/13   Hendricks Limes, MD  Multiple Vitamin (MULTIVITAMIN) tablet Take 1 tablet by mouth daily.      Historical Provider, MD  nitroGLYCERIN (NITROSTAT) 0.4 MG SL tablet Place 1 tablet (0.4 mg total) under the tongue every 5 (five) minutes x 3 doses as needed for chest pain. 11/24/13   Tarri Fuller, PA-C  potassium chloride SA (K-DUR,KLOR-CON) 20 MEQ tablet Take 1 tablet (20 mEq total) by mouth 2 (two) times daily. 01/22/13 01/22/14  Josue Hector, MD  ranitidine (ZANTAC) 150 MG tablet Take 150 mg by mouth 2 (two) times daily.    Historical Provider, MD  rosuvastatin (CRESTOR) 20 MG  tablet Take 20 mg by mouth daily.    Historical Provider, MD  sitaGLIPtin-metformin (JANUMET) 50-1000 MG per tablet Take 1 tablet by mouth 2 (two) times daily with a meal.    Historical Provider, MD  vitamin E 400 UNIT capsule Take 400 Units by mouth daily.    Historical Provider, MD   Allergies  Allergen Reactions  . Cephalexin Diarrhea    Also stomachache  . Tramadol Nausea And Vomiting    "tore her stomach up"  . Septra [Bactrim]     Yeast infection  . Septra [Sulfamethoxazole-Tmp Ds]     "Gave me a yeast infection"    FAMILY HISTORY:  Family History  Problem Relation Age of Onset  . Stroke Mother   . Aneurysm Mother   . Kidney disease Father   . Diabetes Sister   . Diabetes Brother   . Heart disease Brother   . Diabetes Sister   . Diabetes Brother   . Colon cancer Other   . Colon polyps Other    SOCIAL HISTORY:  reports that she quit smoking about 41 years ago. She does not have any smokeless tobacco history on file. She reports that she does not drink alcohol or use illicit drugs.  REVIEW OF SYSTEMS:  Unattainable, patient is sedated and intubated.  SUBJECTIVE:   VITAL SIGNS: Temp:  [96.4 F (35.8 C)] 96.4 F (35.8 C) (02/01 0828) Pulse Rate:  [49-106] 53 (02/01 1057) Resp:  [16-32] 16 (02/01 1057) BP: (74-119)/(42-94) 106/42 mmHg (02/01 1057) SpO2:  [72 %-100 %] 100 % (02/01 1057) FiO2 (%):  [100 %] 100 % (02/01 1057) Weight:  [107 kg (235 lb 14.3 oz)] 107 kg (235 lb 14.3 oz) (02/01 1043) HEMODYNAMICS:   VENTILATOR SETTINGS: Vent Mode:  [-] PRVC FiO2 (%):  [100 %] 100 % Set Rate:  [16 bmp] 16 bmp Vt Set:  [480 mL] 480 mL PEEP:  [5 cmH20] 5 cmH20 INTAKE / OUTPUT: Intake/Output     01/31 0701 - 02/01 0700 02/01 0701 - 02/02 0700   I.V. (mL/kg)  2000 (18.7)   Total Intake(mL/kg)  2000 (18.7)   Emesis/NG output  100   Total Output   100   Net   +1900         PHYSICAL EXAMINATION: General:  Chronically and acutely ill appearing female, agonally  breathing on vent. Neuro:  Unresponsive, sedated and intubated. HEENT:  [place]/AT, PERRL, sclera dry, EOM spontaneous and DMM. Cardiovascular:  RRR (HR 50), Nl S1/S2, -M/R/G. Lungs:  Diffuse crackles. Abdomen:  Soft, NT, ND and +BS. Musculoskeletal:  2+ edema and -tenderness. Skin:  Patchy erythema.  LABS:  CBC  Recent Labs Lab 01/04/2014 0837 Jan 04, 2014 0854  WBC 27.1*  --   HGB 11.0* 12.9  HCT 36.6 38.0  PLT 225  --    Coag's  Recent Labs Lab 01-04-2014 0837  INR 8.50*   BMET  Recent Labs Lab January 04, 2014 0854  NA 129*  K 8.5*  CL 107  BUN 70*  CREATININE 2.00*  GLUCOSE 68*   Electrolytes No results found for this basename: CALCIUM, MG, PHOS,  in the last 168 hours Sepsis Markers  Recent Labs Lab January 04, 2014 0848  LATICACIDVEN 17.41*   ABG No results found for this basename: PHART, PCO2ART, PO2ART,  in the last 168 hours Liver Enzymes No results found for this basename: AST, ALT, ALKPHOS, BILITOT, ALBUMIN,  in the last 168 hours Cardiac Enzymes  Recent Labs Lab 01-04-14 0837  PROBNP >70000.0*   Glucose  Recent Labs Lab 01/04/2014 0826 01/04/2014 0954  GLUCAP 69* 124*    Imaging Ct Head Wo Contrast  2014/01/04   CLINICAL DATA:  Hypotensive.  Unresponsive.  EXAM: CT HEAD WITHOUT CONTRAST  TECHNIQUE: Contiguous axial images were obtained from the base of the skull through the vertex without intravenous contrast.  COMPARISON:  None.  FINDINGS: Ventricles are normal in configuration. There is ventricular and sulcal enlargement reflecting mild to moderate atrophy. No hydrocephalus.  Patchy areas of mild white matter hypoattenuation noted consistent with chronic microvascular ischemic change. No parenchymal masses or mass effect. No evidence of a cortical infarct.  There are no extra-axial masses or abnormal fluid collections.  No intracranial hemorrhage.  Visualized sinuses and mastoid air cells are clear.  IMPRESSION: 1. No acute intracranial abnormalities. 2. Mild to  moderate atrophy. Mild chronic microvascular ischemic change.   Electronically Signed   By: Lajean Manes M.D.   On: 04-Jan-2014 10:34   Dg Chest Portable 1 View  2014/01/04   CLINICAL DATA:  Endotracheal tube assessment.  EXAM: PORTABLE CHEST - 1 VIEW  COMPARISON:  01/04/2014  FINDINGS: An endotracheal tube has been placed since the prior study. The tip lies 1.5 cm above the carina.  There is also new right internal jugular central venous catheter with its tip at the caval atrial junction. A new nasogastric tube passes below the diaphragm into the stomach.  No pneumothorax. Patchy areas of airspace and interstitial lung opacity bilaterally are stable consistent with pulmonary edema. Changes from previous cardiac surgery are stable.  IMPRESSION: Endotracheal tube, nasogastric tube and right internal jugular central venous line are well positioned. No pneumothorax. No other change from the earlier exam.   Electronically Signed   By: Lajean Manes M.D.   On: 01-04-2014 10:10   Dg Chest Port 1 View  04-Jan-2014   CLINICAL DATA:  Shortness of breath, CHF  EXAM: PORTABLE CHEST - 1 VIEW  COMPARISON:  11/16/2013  FINDINGS: Moderate interstitial and alveolar opacities throughout both lungs with relative sparing of the apices, increased since previous exam. Stable cardiomegaly. . No definite effusion. Previous median sternotomy and valve surgery.  IMPRESSION: 1. Interval worsening of bilateral edema or infiltrates.   Electronically Signed   By: Arne Cleveland M.D.   On: 01/04/2014 08:59     CXR: Diffuse infiltrate and pulmonary edema.  ASSESSMENT / PLAN:  PULMONARY A: VDRF due to bilateral PNA vs ARDS. P:   - Full vent support. - Hyperventilate. - Bicarb drip. - Hold weaning for now.  CARDIOVASCULAR A: Septic shock. P:  -  Follow CVP. - Bicarb drip. - Sepsis protocol. - Levophed and vasopressin. - BNP. - 2D echo.  RENAL A:  Acute renal failure, acidosis and hyperkalemia. P:   - Treat  hyperkalemia. - Calcium. - Glucose and insulin. - Bicarb drip. - Renal consult called.  GASTROINTESTINAL A:  No active issues. P:   - Hold TF for now.  HEMATOLOGIC A:  Reactive leukocytosis. P:  - Treat infection. - Monitor CBC.  INFECTIOUS A:  UTI and PNA. P:   - Vanc/zosyn/levaquin. - Pan culture. - Sepsis protocol.  ENDOCRINE A:  DM history.   P:   - ISS. - CBGs.  NEUROLOGIC A:  Unresponsive due to sepsis. P:   - Intermittent sedation protocol.  Family updated at length bedside, told of likely poor prognosis.  I have personally obtained a history, examined the patient, evaluated laboratory and imaging results, formulated the assessment and plan and placed orders.  CRITICAL CARE: The patient is critically ill with multiple organ systems failure and requires high complexity decision making for assessment and support, frequent evaluation and titration of therapies, application of advanced monitoring technologies and extensive interpretation of multiple databases. Critical Care Time devoted to patient care services described in this note is 45 minutes.   Rush Farmer, M.D. [facility] Pulmonary/Critical Care Medicine. Pager: [phone]. After hours pager: [phone].

## 2014-01-03 NOTE — ED Notes (Signed)
Istat Chem8 lab results shown to DR.Plunkett. ED-Lab

## 2014-01-03 NOTE — ED Notes (Addendum)
Assumed care from [place], RN at 0900.

## 2014-01-03 NOTE — Progress Notes (Signed)
Spoke with family again, fully updated, after discussion, decision was made to make patient a LCB with no CPR and no cardioversion.  Additional CC time 45 min.  Rush Farmer, M.D. [facility] Pulmonary/Critical Care Medicine. Pager: [phone]. After hours pager: [phone].

## 2014-01-03 NOTE — Progress Notes (Signed)
ANTIBIOTIC CONSULT NOTE - INITIAL  Pharmacy Consult for vanc, cefepime, levaquin Indication: sepsis  Allergies  Allergen Reactions  . Cephalexin Diarrhea    Also stomachache  . Tramadol Nausea And Vomiting    "tore her stomach up"  . Septra [Bactrim]     Yeast infection  . Septra [Sulfamethoxazole-Tmp Ds]     "Gave me a yeast infection"    Patient Measurements: Height: 5\' 6"  (167.6 cm) Weight: 235 lb 14.3 oz (107 kg) IBW/kg (Calculated) : 59.3 Adjusted Body Weight: 79 kg   Vital Signs: Temp: 96.4 F (35.8 C) (02/01 0828) Temp src: Rectal (02/01 0828) BP: 106/42 mmHg (02/01 1057) Pulse Rate: 53 (02/01 1057)  Intake/Output from this shift: Total I/O In: 2000 [I.V.:2000] Out: 100 [Emesis/NG output:100]  Labs:  Recent Labs  2013/12/29 0837 12/29/2013 0854  WBC 27.1*  --   HGB 11.0* 12.9  PLT 225  --   CREATININE  --  2.00*   Estimated Creatinine Clearance: 31.9 ml/min (by C-G formula based on Cr of 2). No results found for this basename: Letta Median, VANCORANDOM, [place], GENTPEAK, GENTRANDOM, TOBRATROUGH, TOBRAPEAK, TOBRARND, AMIKACINPEAK, AMIKACINTROU, AMIKACIN,  in the last 72 hours   Microbiology: Recent Results (from the past 720 hour(s))  URINE CULTURE     Status: None   Collection Time    11/16/13  4:05 PM      Result Value Range Status   Specimen Description URINE, CLEAN CATCH   Final   Special Requests NONE   Final   Culture  Setup Time     Final   Value: 11/16/2013 21:10     Performed at [place]     Final   Value: 70,000 COLONIES/ML     Performed at [facility]   Culture     Final   Value: ESCHERICHIA COLI     Performed at [facility]   Report Status 11/18/2013 FINAL   Final   Organism ID, Bacteria ESCHERICHIA COLI   Final  MRSA PCR SCREENING     Status: None   Collection Time    11/17/13  1:43 AM      Result Value Range Status   MRSA by PCR NEGATIVE  NEGATIVE Final   Comment:             The GeneXpert MRSA Assay (FDA     approved for NASAL specimens     only), is one component of a     comprehensive MRSA colonization     surveillance program. It is not     intended to diagnose MRSA     infection nor to guide or     monitor treatment for     MRSA infections.  URINE CULTURE     Status: None   Collection Time    11/18/13 10:06 AM      Result Value Range Status   Specimen Description URINE, CLEAN CATCH   Final   Special Requests NONE   Final   Culture  Setup Time     Final   Value: 11/18/2013 18:20     Performed at [place]     Final   Value: 30,000 COLONIES/ML     Performed at [facility]   Culture     Final   Value: ESCHERICHIA COLI     Performed at [facility]   Report Status 11/20/2013 FINAL   Final   Organism ID,  Bacteria ESCHERICHIA COLI   Final    Medical History: Past Medical History  Diagnosis Date  . Adenomatous polyps   . Hemorrhoids, external   . Constipation, chronic   . Hypertension   . CHF (congestive heart failure)   . Aortic stenosis, moderate   . Hyperlipidemia   . Carotid bruit   . Osteoarthritis   . Edema   . Hemoptysis   . Cancer     BREAST-HX OF  . Dysmetabolic syndrome X   . Low back pain syndrome   . Diabetes mellitus     TYPE 2  . Anemia   . Obesity   . Radiation   . GERD (gastroesophageal reflux disease)     hiatal hernia  . Dysrhythmia     afib    Medications:  See PTA med list  Assessment: 73 yo female with cardiac history, AVR 2011, CHF, HLD. Pharmacy consulted to dose abx for sepsis/pneumonia.  Pt  presented from nursing home with AMS x 2-3 days.  Pt arrived non-responsive, tachycardic, wbc 27.1, hypotensive, Tmax 96.4, lactic acid 17.4.  Scr has doubled in the past two weeks, will dose antibiotics accordingly.   Goal of Therapy:  Resolution of infection  Plan:  Cefepime 2 g x1, followed by 1 g q24h Levaquin 750 mg q48h Vanc 2g x1, followed by 1500 mg  q48h  Hughes Better, PharmD, BCPS Clinical Pharmacist 12-19-2013. 11:17 AM

## 2014-01-03 NOTE — Progress Notes (Signed)
Patient expired at 1417. Dr. Jonnie Finner was in the room. Patient was pronounced dead by nurse. [place] [facility] was notified.

## 2014-01-03 NOTE — ED Notes (Signed)
Istat Troponin POC lab results shown to DR.Plunkett. ED-Lab

## 2014-01-03 NOTE — ED Notes (Signed)
RSI successful. 7.5 ET tube, 21@ lip. Bilateral breath sounds. Positive color change.

## 2014-01-03 DEATH — deceased
